# Patient Record
Sex: Male | Born: 1950
Health system: Southern US, Community
[De-identification: ages and names within clinical notes are randomized; demographics above are authoritative.]

## PROBLEM LIST (undated history)

## (undated) DIAGNOSIS — R112 Nausea with vomiting, unspecified: Secondary | ICD-10-CM

## (undated) DIAGNOSIS — Z9889 Other specified postprocedural states: Secondary | ICD-10-CM

## (undated) DIAGNOSIS — I10 Essential (primary) hypertension: Secondary | ICD-10-CM

## (undated) DIAGNOSIS — E785 Hyperlipidemia, unspecified: Secondary | ICD-10-CM

## (undated) HISTORY — DX: Hyperlipidemia, unspecified: E78.5

## (undated) HISTORY — PX: BACK SURGERY: SHX140

## (undated) HISTORY — PX: EXPLORATORY LAPAROTOMY: SUR591

---

## 2003-01-04 ENCOUNTER — Ambulatory Visit (HOSPITAL_COMMUNITY): Admission: RE | Admit: 2003-01-04 | Discharge: 2003-01-04 | Payer: Self-pay | Admitting: Gastroenterology

## 2003-04-03 ENCOUNTER — Ambulatory Visit (HOSPITAL_COMMUNITY): Admission: RE | Admit: 2003-04-03 | Discharge: 2003-04-03 | Payer: Self-pay | Admitting: Gastroenterology

## 2006-02-07 ENCOUNTER — Emergency Department (HOSPITAL_COMMUNITY): Admission: EM | Admit: 2006-02-07 | Discharge: 2006-02-07 | Payer: Self-pay | Admitting: Emergency Medicine

## 2006-06-08 ENCOUNTER — Ambulatory Visit: Payer: Self-pay | Admitting: Internal Medicine

## 2006-06-25 ENCOUNTER — Ambulatory Visit: Payer: Self-pay | Admitting: Internal Medicine

## 2008-05-20 ENCOUNTER — Emergency Department (HOSPITAL_COMMUNITY): Admission: EM | Admit: 2008-05-20 | Discharge: 2008-05-20 | Payer: Self-pay | Admitting: Family Medicine

## 2008-06-30 ENCOUNTER — Ambulatory Visit: Payer: Self-pay | Admitting: Internal Medicine

## 2008-07-01 ENCOUNTER — Encounter: Payer: Self-pay | Admitting: Family Medicine

## 2008-07-06 ENCOUNTER — Ambulatory Visit: Payer: Self-pay | Admitting: Internal Medicine

## 2008-07-07 ENCOUNTER — Ambulatory Visit: Payer: Self-pay | Admitting: *Deleted

## 2008-08-03 ENCOUNTER — Ambulatory Visit (HOSPITAL_COMMUNITY): Admission: RE | Admit: 2008-08-03 | Discharge: 2008-08-03 | Payer: Self-pay | Admitting: Urology

## 2008-09-20 ENCOUNTER — Ambulatory Visit: Payer: Self-pay | Admitting: Internal Medicine

## 2008-09-20 ENCOUNTER — Encounter: Payer: Self-pay | Admitting: Family Medicine

## 2008-09-20 LAB — CONVERTED CEMR LAB
BUN: 14 mg/dL (ref 6–23)
CO2: 22 meq/L (ref 19–32)
Cholesterol: 190 mg/dL (ref 0–200)
Creatinine, Ser: 0.77 mg/dL (ref 0.40–1.50)
Eosinophils Relative: 4 % (ref 0–5)
Glucose, Bld: 98 mg/dL (ref 70–99)
HCT: 44.4 % (ref 39.0–52.0)
Hemoglobin: 14.4 g/dL (ref 13.0–17.0)
Lymphocytes Relative: 48 % — ABNORMAL HIGH (ref 12–46)
Lymphs Abs: 3.2 10*3/uL (ref 0.7–4.0)
Monocytes Absolute: 0.6 10*3/uL (ref 0.1–1.0)
Monocytes Relative: 8 % (ref 3–12)
Total Bilirubin: 0.5 mg/dL (ref 0.3–1.2)
Total CHOL/HDL Ratio: 3.7
Total Protein: 7 g/dL (ref 6.0–8.3)
Triglycerides: 245 mg/dL — ABNORMAL HIGH (ref <150)
VLDL: 49 mg/dL — ABNORMAL HIGH (ref 0–40)
WBC: 6.7 10*3/uL (ref 4.0–10.5)

## 2008-09-27 ENCOUNTER — Ambulatory Visit: Payer: Self-pay | Admitting: Internal Medicine

## 2008-10-10 ENCOUNTER — Ambulatory Visit: Payer: Self-pay | Admitting: Family Medicine

## 2008-10-25 ENCOUNTER — Encounter (INDEPENDENT_AMBULATORY_CARE_PROVIDER_SITE_OTHER): Payer: Self-pay | Admitting: Adult Health

## 2008-10-25 ENCOUNTER — Ambulatory Visit: Payer: Self-pay | Admitting: Internal Medicine

## 2008-10-25 LAB — CONVERTED CEMR LAB
AST: 26 units/L (ref 0–37)
Alkaline Phosphatase: 72 units/L (ref 39–117)
BUN: 10 mg/dL (ref 6–23)
Basophils Relative: 0 % (ref 0–1)
Creatinine, Ser: 0.73 mg/dL (ref 0.40–1.50)
Eosinophils Absolute: 0.2 10*3/uL (ref 0.0–0.7)
MCHC: 33.6 g/dL (ref 30.0–36.0)
MCV: 91.4 fL (ref 78.0–100.0)
Monocytes Relative: 9 % (ref 3–12)
Neutrophils Relative %: 37 % — ABNORMAL LOW (ref 43–77)
Potassium: 4.4 meq/L (ref 3.5–5.3)
RBC: 4.79 M/uL (ref 4.22–5.81)
Total Bilirubin: 0.5 mg/dL (ref 0.3–1.2)

## 2008-11-08 ENCOUNTER — Ambulatory Visit: Payer: Self-pay | Admitting: Internal Medicine

## 2008-11-28 ENCOUNTER — Encounter: Payer: Self-pay | Admitting: Family Medicine

## 2008-11-28 ENCOUNTER — Ambulatory Visit: Payer: Self-pay | Admitting: Internal Medicine

## 2008-11-28 LAB — CONVERTED CEMR LAB
ALT: 23 units/L (ref 0–53)
CO2: 23 meq/L (ref 19–32)
Calcium: 9.3 mg/dL (ref 8.4–10.5)
Chloride: 106 meq/L (ref 96–112)
Cholesterol: 183 mg/dL (ref 0–200)
Potassium: 4.2 meq/L (ref 3.5–5.3)
Sed Rate: 3 mm/hr (ref 0–16)
Sodium: 142 meq/L (ref 135–145)
Testosterone: 125.62 ng/dL — ABNORMAL LOW (ref 350–890)
Total Protein: 7.2 g/dL (ref 6.0–8.3)
Vit D, 25-Hydroxy: 23 ng/mL — ABNORMAL LOW (ref 30–89)
Vitamin B-12: 618 pg/mL (ref 211–911)

## 2008-12-14 ENCOUNTER — Ambulatory Visit: Payer: Self-pay | Admitting: Internal Medicine

## 2009-01-10 ENCOUNTER — Ambulatory Visit: Payer: Self-pay | Admitting: Internal Medicine

## 2009-01-12 ENCOUNTER — Ambulatory Visit: Payer: Self-pay | Admitting: Internal Medicine

## 2009-01-22 ENCOUNTER — Ambulatory Visit: Payer: Self-pay | Admitting: *Deleted

## 2009-02-12 ENCOUNTER — Ambulatory Visit: Payer: Self-pay | Admitting: Internal Medicine

## 2009-03-08 ENCOUNTER — Ambulatory Visit: Payer: Self-pay | Admitting: Internal Medicine

## 2009-03-14 ENCOUNTER — Ambulatory Visit: Payer: Self-pay | Admitting: Internal Medicine

## 2009-04-13 ENCOUNTER — Ambulatory Visit: Payer: Self-pay | Admitting: Internal Medicine

## 2009-05-08 ENCOUNTER — Ambulatory Visit: Payer: Self-pay | Admitting: Family Medicine

## 2009-05-14 ENCOUNTER — Ambulatory Visit: Payer: Self-pay | Admitting: Internal Medicine

## 2009-05-15 ENCOUNTER — Encounter: Payer: Self-pay | Admitting: Family Medicine

## 2009-06-13 ENCOUNTER — Ambulatory Visit: Payer: Self-pay | Admitting: Internal Medicine

## 2009-07-13 ENCOUNTER — Ambulatory Visit: Payer: Self-pay | Admitting: Internal Medicine

## 2009-08-09 ENCOUNTER — Ambulatory Visit: Payer: Self-pay | Admitting: Internal Medicine

## 2009-09-18 ENCOUNTER — Ambulatory Visit: Payer: Self-pay | Admitting: Internal Medicine

## 2009-10-16 ENCOUNTER — Ambulatory Visit: Payer: Self-pay | Admitting: Internal Medicine

## 2009-11-14 ENCOUNTER — Ambulatory Visit: Payer: Self-pay | Admitting: Internal Medicine

## 2009-11-26 ENCOUNTER — Ambulatory Visit: Payer: Self-pay | Admitting: Internal Medicine

## 2010-02-01 ENCOUNTER — Ambulatory Visit: Payer: Self-pay | Admitting: Internal Medicine

## 2010-02-01 LAB — CONVERTED CEMR LAB
ALT: 25 units/L (ref 0–53)
AST: 21 units/L (ref 0–37)
Albumin: 4.5 g/dL (ref 3.5–5.2)
Alkaline Phosphatase: 67 units/L (ref 39–117)
Calcium: 9.4 mg/dL (ref 8.4–10.5)
Chloride: 104 meq/L (ref 96–112)
Creatinine, Ser: 0.69 mg/dL (ref 0.40–1.50)
LDL Cholesterol: 96 mg/dL (ref 0–99)
Potassium: 4.1 meq/L (ref 3.5–5.3)
Testosterone: 130.09 ng/dL — ABNORMAL LOW (ref 350–890)
Total CHOL/HDL Ratio: 3.7
Uric Acid, Serum: 4 mg/dL (ref 4.0–7.8)

## 2010-02-05 ENCOUNTER — Ambulatory Visit: Payer: Self-pay | Admitting: Internal Medicine

## 2010-02-18 ENCOUNTER — Ambulatory Visit: Payer: Self-pay | Admitting: Internal Medicine

## 2010-03-19 ENCOUNTER — Ambulatory Visit: Payer: Self-pay | Admitting: Internal Medicine

## 2010-04-04 ENCOUNTER — Ambulatory Visit: Payer: Self-pay | Admitting: Internal Medicine

## 2010-04-18 ENCOUNTER — Ambulatory Visit: Payer: Self-pay | Admitting: Internal Medicine

## 2010-05-02 ENCOUNTER — Ambulatory Visit: Payer: Self-pay | Admitting: Internal Medicine

## 2010-05-03 ENCOUNTER — Emergency Department (HOSPITAL_COMMUNITY): Admission: EM | Admit: 2010-05-03 | Discharge: 2010-05-03 | Payer: Self-pay | Admitting: Emergency Medicine

## 2010-06-13 ENCOUNTER — Encounter (INDEPENDENT_AMBULATORY_CARE_PROVIDER_SITE_OTHER): Payer: Self-pay | Admitting: *Deleted

## 2010-07-12 ENCOUNTER — Ambulatory Visit (HOSPITAL_COMMUNITY)
Admission: RE | Admit: 2010-07-12 | Discharge: 2010-07-12 | Payer: Self-pay | Source: Home / Self Care | Admitting: Family Medicine

## 2010-10-10 ENCOUNTER — Encounter (INDEPENDENT_AMBULATORY_CARE_PROVIDER_SITE_OTHER): Payer: Self-pay | Admitting: *Deleted

## 2010-10-10 LAB — CONVERTED CEMR LAB: Testosterone: 300.94 ng/dL (ref 250–890)

## 2010-10-25 ENCOUNTER — Encounter (INDEPENDENT_AMBULATORY_CARE_PROVIDER_SITE_OTHER): Payer: Self-pay | Admitting: Family Medicine

## 2010-10-25 LAB — CONVERTED CEMR LAB
ALT: 22 units/L (ref 0–53)
BUN: 12 mg/dL (ref 6–23)
CO2: 25 meq/L (ref 19–32)
Calcium: 9.7 mg/dL (ref 8.4–10.5)
Cholesterol: 185 mg/dL (ref 0–200)
Creatinine, Ser: 0.9 mg/dL (ref 0.40–1.50)
HDL: 45 mg/dL (ref >39)
Total Bilirubin: 0.8 mg/dL (ref 0.3–1.2)
Total CHOL/HDL Ratio: 4.1
Triglycerides: 158 mg/dL — ABNORMAL HIGH (ref <150)
VLDL: 32 mg/dL (ref 0–40)

## 2010-12-25 ENCOUNTER — Inpatient Hospital Stay (INDEPENDENT_AMBULATORY_CARE_PROVIDER_SITE_OTHER)
Admission: RE | Admit: 2010-12-25 | Discharge: 2010-12-25 | Disposition: A | Discharge status: Home / Self Care | Payer: Self-pay | Source: Ambulatory Visit | Attending: Emergency Medicine | Admitting: Emergency Medicine

## 2010-12-25 ENCOUNTER — Ambulatory Visit (INDEPENDENT_AMBULATORY_CARE_PROVIDER_SITE_OTHER): Payer: Self-pay

## 2010-12-25 DIAGNOSIS — S61409A Unspecified open wound of unspecified hand, initial encounter: Secondary | ICD-10-CM

## 2010-12-25 LAB — GLUCOSE, CAPILLARY: Glucose-Capillary: 104 mg/dL — ABNORMAL HIGH (ref 70–99)

## 2011-01-17 NOTE — Op Note (Signed)
NAMEREVIS, WHALIN                          ACCOUNT NO.:  [id number]   MEDICAL RECORD NO.:  [id number]                   PATIENT TYPE:  AMB   LOCATION:  ENDO                                 FACILITY:  [facility]   PHYSICIAN:  Danise Edge, M.D.                DATE OF BIRTH:  February 26, 1951   DATE OF PROCEDURE:  04/03/2003  DATE OF DISCHARGE:                                 OPERATIVE REPORT   PROCEDURE:  Esophagogastroduodenoscopy.   PROCEDURE INDICATION:  [name] is a 60 year old male, born [birth year].  [name] underwent an esophagogastroduodenoscopy Jan 04, 2003,  to evaluate epigastric pain.  His esophagogastroduodenoscopy revealed a 2 mm  x 5 mm prepyloric gastric antrum ulcer which was negative for Helicobacter  pylori by CLOtest.  [name] has completed approximately nine weeks of  proton pump inhibitor therapy.  He is scheduled to undergo a repeat  esophagogastroduodenoscopy to confirm ulcer healing.   ENDOSCOPIST:  Charolett Bumpers, M.D.   PREMEDICATION:  1. Versed 7.5 mg.  2. Demerol 50 mg.   DESCRIPTION OF PROCEDURE:  After obtaining informed consent, [name]  was placed in the left lateral decubitus position.  I administered  intravenous Versed and intravenous Demerol to achieve conscious sedation for  the procedure.  The patient's blood pressure, oxygen saturation, and cardiac  rhythm were monitored throughout the procedure and documented in the medical  record.   The Olympus gastroscope was passed through the posterior hypopharynx into  the proximal esophagus without difficulty.  The hypopharynx, larynx, and  vocal cords appeared normal.   ESOPHAGOSCOPY:  The proximal, mid, and lower segments of the esophageal  mucosa appeared normal.   GASTROSCOPY:  Retroflexed view of the gastric cardia and fundus was normal.  The gastric body appeared normal.  There is 95% healing of the prepyloric  gastric antral ulcer, originally diagnosed Jan 04, 2003.  What remains is a 2  mm shallow erosion with exudative base.  The pylorus appears normal.   DUODENOSCOPY:  The duodenal bulb and descending duodenum appear normal.   ASSESSMENT:  1. Healing of 95% of the prepyloric gastric antral ulcer, originally     diagnosed Jan 04, 2003.  2. CLOtest was negative for Helicobacter pylori antral gastritis.    PLAN:  I do not think [name] requires any further proton pump  inhibitor therapy.  I will ask him to remain off nonsteroidal anti-  inflammatory medication which undoubtedly was the cause of his gastric  antral ulcer.                                               Danise Edge, M.D.    MJ/MEDQ  D:  04/03/2003  T:  04/03/2003  Job:  [id number]  cc:   [facility]  [address]  [place], [place] [address]

## 2011-01-17 NOTE — Op Note (Signed)
NAMEJAYCEON, TROY NO.:  [id number]   MEDICAL RECORD NO.:  [id number]                   PATIENT TYPE:  AMB   LOCATION:  ENDO                                 FACILITY:  [facility]   PHYSICIAN:  Danise Edge, M.D.                DATE OF BIRTH:  May 02, 1951   DATE OF PROCEDURE:  01/04/2003  DATE OF DISCHARGE:                                 OPERATIVE REPORT   REFERRING PHYSICIAN:  Justice Britain, PA., [facility].   PROCEDURE:  Esophagogastroduodenoscopy.   PROCEDURE INDICATION:  Mr. Omar Gayden is a 60 year old male, born 10/31/1950.  Mr. Albertina Senegal was evaluated at the [facility] complaining of epigastric pain.  His H. pylori antibody titer  was elevated.  He has symptomatically improved on proton pump inhibitor  therapy (Prilosec).  Esophagogastroduodenoscopy is scheduled today.   ENDOSCOPIST:  Charolett Bumpers, M.D.   PREMEDICATION:  1. Versed 7.5 mg.  2. Demerol 50 mg.   DESCRIPTION OF PROCEDURE:  After obtaining informed consent, Mr. Albertina Senegal  was placed in the left lateral decubitus position.  I administered  intravenous Demerol and intravenous Versed to achieve conscious sedation for  the procedure.  The patient's blood pressure, oxygen saturation, and cardiac  rhythm were monitored throughout the procedure and documented in the medical  record.   The Olympus gastroscope was passed through the posterior hypopharynx into  the proximal esophagus without difficulty.  The hypopharynx, larynx, and  vocal cords appeared normal.   ESOPHAGOSCOPY:  The proximal, mid, and lower segments of the esophageal  mucosa appear normal.   GASTROSCOPY:  Retroflexed view of the gastric cardia and fundus was normal.  The gastric body appeared normal.  In the immediate prepyloric gastric  antrum there is a 2 mm x 5 mm ulcer with exudative base and no stigmata of  bleeding.  Ulcer margins are sharp and  consistent with a benign prepyloric  gastric ulcer.  The pylorus appears normal.   DUODENOSCOPY:  The duodenal bulb, mid duodenum, and distal duodenum appear  normal.   BIOPSY:  A biopsy was take from the distal gastric antrum for CLOtest to  rule out Helicobacter pylori antral gastritis.    ASSESSMENT:  1. Prepyloric gastric antral ulcer.  2. Rule out nonsteroidal anti-inflammatory medication versus H. pylori.   PLAN:  I will see Mr. Albertina Senegal back in my office in approximately one week.  He continues to improve on Prilosec.                                               Danise Edge, M.D.    MJ/MEDQ  D:  01/04/2003  T:  01/04/2003  Job:  [id number]   cc:   Justice Britain,  PA  [facility]  [address]  [place], [place] [address]

## 2011-02-06 LAB — HEMOCCULT SLIDES (X 3 CARDS)

## 2011-03-11 ENCOUNTER — Encounter (INDEPENDENT_AMBULATORY_CARE_PROVIDER_SITE_OTHER): Payer: Self-pay

## 2011-03-11 ENCOUNTER — Encounter (INDEPENDENT_AMBULATORY_CARE_PROVIDER_SITE_OTHER): Payer: Self-pay | Admitting: Vascular Surgery

## 2011-03-11 DIAGNOSIS — R0989 Other specified symptoms and signs involving the circulatory and respiratory systems: Secondary | ICD-10-CM

## 2011-03-11 DIAGNOSIS — R1013 Epigastric pain: Secondary | ICD-10-CM

## 2011-03-11 NOTE — Consult Note (Signed)
NEW PATIENT CONSULTATION  Michael Campos, [name] DOB:  1951-05-18                                       03/11/2011 EAVWU#:[id number]  Patient is a 60 year old Hispanic male referred for a pulsatile abdominal mass and abdominal pain.  This patient via an interpreter states that he has had intermittent upper abdominal discomfort over the past 5 years, usually 1-2 episodes per year, not associated with nausea, vomiting, or change in bowel habits.  He does have some occasional heartburn.  On physical exam at [facility], there was a question of a pulsatile mass.  He was referred to rule out an aneurysm.  CHRONIC MEDICAL PROBLEMS: 1. Type 2 diabetes mellitus. 2. Hyperlipidemia. 3. Hypertension.  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  Does not use tobacco, has not for 20 years.  Does not use alcohol.  FAMILY HISTORY:  Positive for diabetes in his mother.  Negative for coronary artery disease or stroke.  REVIEW OF SYSTEMS:  Completely negative in a complete review of systems.  PHYSICAL EXAMINATION:  Blood pressure 126/74, heart rate 60, respirations 16.  General:  He is a well-developed and well-nourished male in no apparent distress, alert and oriented x3.  HEENT:  Normal for age.  EOMs intact.  Lungs:  Clear to auscultation.  No rhonchi or wheezing.  Cardiovascular:  Regular rhythm.  No murmurs.  Carotid pulses are 3+.  No audible bruits.  Abdomen:  Soft, nontender.  No pulsatile masses appreciated.  Musculoskeletal:  Free of major deformities. Neurologic:  Normal.  Skin:  Free of rashes.  Lower extremity exam reveals 3+ femoral and popliteal pulses palpable bilaterally.  Today I ordered a duplex scan of the abdominal aorta which I have reviewed and interpreted.  There is no evidence of an aneurysm in the aorta with normal caliber.  I reassured the patient via the interpreter that he did not have an aneurysm or any  vascular reason for his intermittent pain, and no treatment is indicated from a vascular standpoint.  Return on a p.r.n. basis.    Michael Campos, M.D. Electronically Signed  JDL/MEDQ  D:  03/11/2011  T:  03/11/2011  Job:  [id number]  cc:   Maurice March, M.D.

## 2011-03-19 NOTE — Procedures (Unsigned)
DUPLEX ULTRASOUND OF ABDOMINAL AORTA  INDICATION:  Pulsatile abdominal mass and epigastric pain for several months.  HISTORY: Diabetes:  No. Cardiac:  No. Hypertension:  No. Smoking:  No. Connective Tissue Disorder: Family History:  No. Previous Surgery:  No.  DUPLEX EXAM:         AP (cm)                   TRANSVERSE (cm) Proximal             0.86 cm                   1.03 cm Mid                  1.59 cm                   1.54 cm Distal               1.49 cm                   1.49 cm Right Iliac          0.91 cm Left Iliac           0.94 cm  PREVIOUS:  None  IMPRESSION:  No evidence of abdominal aortic aneurysm.  ___________________________________________ Quita Skye Hart [place], M.D.  RS/MEDQ  D:  03/12/2011  T:  03/12/2011  Job:  [id number]

## 2011-05-31 ENCOUNTER — Inpatient Hospital Stay (INDEPENDENT_AMBULATORY_CARE_PROVIDER_SITE_OTHER)
Admission: RE | Admit: 2011-05-31 | Discharge: 2011-05-31 | Disposition: A | Discharge status: Home / Self Care | Payer: Self-pay | Source: Ambulatory Visit | Attending: Family Medicine | Admitting: Family Medicine

## 2011-05-31 DIAGNOSIS — H9209 Otalgia, unspecified ear: Secondary | ICD-10-CM

## 2011-05-31 DIAGNOSIS — H6980 Other specified disorders of Eustachian tube, unspecified ear: Secondary | ICD-10-CM

## 2011-06-02 LAB — POCT URINALYSIS DIP (DEVICE)
Bilirubin Urine: NEGATIVE
Ketones, ur: NEGATIVE
Protein, ur: NEGATIVE
Specific Gravity, Urine: 1.025
pH: 5.5

## 2011-07-30 ENCOUNTER — Other Ambulatory Visit (HOSPITAL_COMMUNITY): Payer: Self-pay | Admitting: Family Medicine

## 2011-07-30 DIAGNOSIS — E039 Hypothyroidism, unspecified: Secondary | ICD-10-CM

## 2011-08-12 ENCOUNTER — Ambulatory Visit (HOSPITAL_COMMUNITY)
Admission: RE | Admit: 2011-08-12 | Discharge: 2011-08-12 | Disposition: A | Discharge status: Home / Self Care | Payer: Self-pay | Source: Ambulatory Visit | Attending: Family Medicine | Admitting: Family Medicine

## 2011-08-12 DIAGNOSIS — Z13820 Encounter for screening for osteoporosis: Secondary | ICD-10-CM | POA: Insufficient documentation

## 2011-08-12 DIAGNOSIS — E039 Hypothyroidism, unspecified: Secondary | ICD-10-CM | POA: Insufficient documentation

## 2011-09-30 ENCOUNTER — Emergency Department (HOSPITAL_COMMUNITY)
Admission: EM | Admit: 2011-09-30 | Discharge: 2011-09-30 | Disposition: A | Discharge status: Home / Self Care | Payer: Self-pay | Source: Home / Self Care

## 2011-09-30 ENCOUNTER — Encounter (HOSPITAL_COMMUNITY): Payer: Self-pay | Admitting: Emergency Medicine

## 2011-09-30 DIAGNOSIS — S61012A Laceration without foreign body of left thumb without damage to nail, initial encounter: Secondary | ICD-10-CM

## 2011-09-30 HISTORY — DX: Essential (primary) hypertension: I10

## 2011-09-30 MED ORDER — CEPHALEXIN 500 MG PO CAPS: 500.0000 mg | ORAL_CAPSULE | Freq: Two times a day (BID) | ORAL | Status: AC

## 2011-09-30 MED ORDER — HYDROCODONE-ACETAMINOPHEN 5-325 MG PO TABS
ORAL_TABLET | ORAL | Status: AC
  Filled 2011-09-30: qty 1

## 2011-09-30 MED ORDER — HYDROCODONE-ACETAMINOPHEN 5-325 MG PO TABS
1.0000 | ORAL_TABLET | ORAL | Status: AC
  Administered 2011-09-30: 1 via ORAL

## 2011-09-30 MED ORDER — BACITRACIN 500 UNIT/GM EX OINT
1.0000 "application " | TOPICAL_OINTMENT | Freq: Two times a day (BID) | CUTANEOUS | Status: DC
  Administered 2011-09-30: 1 via TOPICAL

## 2011-09-30 NOTE — ED Provider Notes (Signed)
History     CSN: [id number]  Arrival date & time 09/30/11  1834   None     Chief Complaint  Patient presents with  . 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  Reports he slipped and cut his left thumb with the knife.  Denies change in sensation or mobility of his thumb.  Denies any other injury.  Tetanus vaccine 8 months ago.    Patient is a 61 y.o. male presenting with skin laceration. The history is provided by the patient.  Laceration     Past Medical History  Diagnosis Date  . Diabetes mellitus   . Hypertension     History reviewed. No pertinent past surgical history.  No family history on file.  History  Substance Use Topics  . Smoking status: Never Smoker   . Smokeless tobacco: Not on file  . Alcohol Use: No      Review of Systems  All other systems reviewed and are negative.    Allergies  Review of patient's allergies indicates no known allergies.  Home Medications   Current Outpatient Rx  Name Route Sig Dispense Refill  . ATORVASTATIN CALCIUM 10 MG PO TABS Oral Take 10 mg by mouth daily.    Marland Kitchen CALCIUM + D PO Oral Take by mouth.    Marland Kitchen VITAMIN D PO Oral Take by mouth.    Marland Kitchen GLIMEPIRIDE 2 MG PO TABS Oral Take 2 mg by mouth daily before breakfast.    . LISINOPRIL 5 MG PO TABS Oral Take 5 mg by mouth daily.    Marland Kitchen FISH OIL CONCENTRATE PO Oral Take by mouth.    . OMEPRAZOLE 20 MG PO CPDR Oral Take 20 mg by mouth daily.      BP 141/81  Pulse 75  Temp(Src) 97.5 F (36.4 C) (Oral)  Resp 18  SpO2 96%  Physical Exam  Nursing note and vitals reviewed. Constitutional: He is oriented to person, place, and time. He appears well-developed and well-nourished.  HENT:  Head: Normocephalic and atraumatic.  Neck: Neck supple.  Pulmonary/Chest: Effort normal.  Musculoskeletal:   Left hand: He exhibits laceration. He exhibits normal range of motion and normal capillary refill. normal sensation noted. Normal strength noted.       Hands: Neurological: He is alert and oriented to person, place, and time.    ED Course  Procedures (including critical care time)  Labs Reviewed - No data to display No results found.  LACERATION REPAIR Performed by: Rise Patience Consent: Verbal consent obtained. Risks and benefits: risks, benefits and alternatives were discussed Patient identity confirmed: provided demographic data Time out performed prior to procedure Prepped and Draped in normal sterile fashion Wound explored  Laceration Location: left thumb  Laceration Length: 2cm  No Foreign Bodies seen or palpated  Anesthesia: digital block  Local anesthetic: lidocaine 2% no epinephrine  Anesthetic total: 5 ml  Irrigation method: scrub, lavage Amount of cleaning: standard  Skin closure: 5-0 nylon  Number of sutures or staples: 6  Technique: simple interrupted  Patient tolerance: Patient tolerated the procedure well with no immediate complications.  1. Laceration of thumb, left       MDM  Patient with laceration to left thumb with his own knife while working.  Neurologically intact, full ROM.  Doubt any bony involvement.  Wound sutured in urgent care.  Patient given keflex  and wound care instructions as patient is diabetic.  Precautions given for immediate return.  Patient verbalizes understanding and agrees with plan.          Dillard Cannon [place], [place] 09/30/11 2105

## 2011-09-30 NOTE — ED Notes (Signed)
Reports cutting left thumb today with a pocketknife.  Currently has a dry dressing intact.

## 2011-09-30 NOTE — ED Notes (Signed)
Tetanus was last than a year-July 2012

## 2011-10-01 NOTE — ED Provider Notes (Signed)
Medical screening examination/treatment/procedure(s) were performed by non-physician practitioner and as supervising physician I was immediately available for consultation/collaboration.   [facility]; MD   Sharin Grave, MD 10/01/11 [phone]

## 2011-12-16 ENCOUNTER — Encounter (INDEPENDENT_AMBULATORY_CARE_PROVIDER_SITE_OTHER): Payer: Self-pay | Admitting: General Surgery

## 2011-12-16 ENCOUNTER — Ambulatory Visit (INDEPENDENT_AMBULATORY_CARE_PROVIDER_SITE_OTHER): Payer: PRIVATE HEALTH INSURANCE | Admitting: General Surgery

## 2011-12-16 VITALS — BP 128/80 | HR 88 | Temp 97.8°F | Resp 16 | Ht 67.5 in | Wt 179.4 lb

## 2011-12-16 DIAGNOSIS — K4090 Unilateral inguinal hernia, without obstruction or gangrene, not specified as recurrent: Secondary | ICD-10-CM | POA: Insufficient documentation

## 2011-12-16 NOTE — Patient Instructions (Signed)
Call us if you want to schedule the surgery.  [phone].
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
## 2012-09-21 ENCOUNTER — Emergency Department (INDEPENDENT_AMBULATORY_CARE_PROVIDER_SITE_OTHER): Payer: Self-pay

## 2012-09-21 ENCOUNTER — Encounter (HOSPITAL_COMMUNITY): Payer: Self-pay | Admitting: *Deleted

## 2012-09-21 ENCOUNTER — Emergency Department (INDEPENDENT_AMBULATORY_CARE_PROVIDER_SITE_OTHER)
Admission: EM | Admit: 2012-09-21 | Discharge: 2012-09-21 | Disposition: A | Discharge status: Home / Self Care | Payer: Self-pay | Source: Home / Self Care | Attending: Emergency Medicine | Admitting: Emergency Medicine

## 2012-09-21 DIAGNOSIS — S93602A Unspecified sprain of left foot, initial encounter: Secondary | ICD-10-CM

## 2012-09-21 DIAGNOSIS — M19079 Primary osteoarthritis, unspecified ankle and foot: Secondary | ICD-10-CM

## 2012-09-21 DIAGNOSIS — S93609A Unspecified sprain of unspecified foot, initial encounter: Secondary | ICD-10-CM

## 2012-09-21 DIAGNOSIS — M19072 Primary osteoarthritis, left ankle and foot: Secondary | ICD-10-CM

## 2012-09-21 MED ORDER — MELOXICAM 7.5 MG PO TABS: 7.5000 mg | ORAL_TABLET | Freq: Every day | ORAL | Status: DC

## 2012-09-21 MED ORDER — GLIMEPIRIDE 2 MG PO TABS: 2.0000 mg | ORAL_TABLET | Freq: Every day | ORAL | Status: DC

## 2012-09-21 MED ORDER — OMEPRAZOLE 20 MG PO CPDR: 20.0000 mg | DELAYED_RELEASE_CAPSULE | Freq: Every day | ORAL | Status: DC

## 2012-09-21 MED ORDER — TRAMADOL HCL 50 MG PO TABS: 50.0000 mg | ORAL_TABLET | Freq: Four times a day (QID) | ORAL | Status: DC | PRN

## 2012-09-21 MED ORDER — LISINOPRIL 5 MG PO TABS: 5.0000 mg | ORAL_TABLET | Freq: Every day | ORAL | Status: DC
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
## 2012-09-24 ENCOUNTER — Encounter (HOSPITAL_COMMUNITY): Payer: Self-pay

## 2012-09-24 ENCOUNTER — Emergency Department (HOSPITAL_COMMUNITY)
Admission: EM | Admit: 2012-09-24 | Discharge: 2012-09-24 | Disposition: A | Discharge status: Home / Self Care | Payer: Self-pay | Source: Home / Self Care | Attending: Family Medicine | Admitting: Family Medicine

## 2012-09-24 DIAGNOSIS — E119 Type 2 diabetes mellitus without complications: Secondary | ICD-10-CM

## 2012-09-24 DIAGNOSIS — I10 Essential (primary) hypertension: Secondary | ICD-10-CM

## 2012-09-24 DIAGNOSIS — E785 Hyperlipidemia, unspecified: Secondary | ICD-10-CM

## 2012-09-24 LAB — COMPREHENSIVE METABOLIC PANEL
AST: 21 U/L (ref 0–37)
BUN: 12 mg/dL (ref 6–23)
CO2: 26 mEq/L (ref 19–32)
Chloride: 103 mEq/L (ref 96–112)
Creatinine, Ser: 0.57 mg/dL (ref 0.50–1.35)
GFR calc non Af Amer: >90 mL/min (ref >90)
Total Bilirubin: 0.2 mg/dL — ABNORMAL LOW (ref 0.3–1.2)

## 2012-09-24 LAB — LIPID PANEL
LDL Cholesterol: UNDETERMINED mg/dL (ref 0–99)
Triglycerides: 427 mg/dL — ABNORMAL HIGH (ref <150)

## 2012-09-24 LAB — HEMOGLOBIN A1C: Hgb A1c MFr Bld: 6.4 % — ABNORMAL HIGH (ref <5.7)

## 2012-09-24 LAB — TSH: TSH: 1.865 u[IU]/mL (ref 0.350–4.500)

## 2012-09-24 MED ORDER — LISINOPRIL 5 MG PO TABS: 5.0000 mg | ORAL_TABLET | Freq: Every day | ORAL | Status: DC

## 2012-09-24 MED ORDER — OMEPRAZOLE 20 MG PO CPDR: 20.0000 mg | DELAYED_RELEASE_CAPSULE | Freq: Every day | ORAL | Status: DC

## 2012-09-24 MED ORDER — GLIMEPIRIDE 2 MG PO TABS: 2.0000 mg | ORAL_TABLET | Freq: Every day | ORAL | Status: DC

## 2012-09-24 MED ORDER — ATORVASTATIN CALCIUM 10 MG PO TABS: 10.0000 mg | ORAL_TABLET | Freq: Every day | ORAL | Status: DC

## 2012-09-24 NOTE — ED Notes (Signed)
Follow up-sprain to left foot
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
## 2012-09-25 LAB — VITAMIN D 25 HYDROXY (VIT D DEFICIENCY, FRACTURES): Vit D, 25-Hydroxy: 39 ng/mL (ref 30–89)

## 2012-09-25 NOTE — Progress Notes (Signed)
Quick Note:  Please notify patient that his labs came back ok except that his triglycerides were elevated. I recommend that patient return for fasting lipid panel in 1 month (nurse visit for labs).    Michael Langton, MD, CDE, FAAFP [facility] [place], [place]   ______

## 2012-10-04 ENCOUNTER — Telehealth (HOSPITAL_COMMUNITY): Payer: Self-pay

## 2012-10-04 NOTE — Telephone Encounter (Signed)
Message copied by Lestine Mount on Mon Oct 04, 2012  9:55 AM ------      Message from: Cleora Fleet      Created: Sat Sep 25, 2012  6:00 PM       Please notify patient that his labs came back ok except that his triglycerides were elevated.  I recommend that patient return for fasting lipid panel in 1 month (nurse visit for labs).                    Rodney Langton, MD, CDE, FAAFP      [facility]      [facility]      [place], [place]

## 2012-10-26 ENCOUNTER — Emergency Department (HOSPITAL_COMMUNITY)
Admission: EM | Admit: 2012-10-26 | Discharge: 2012-10-26 | Disposition: A | Discharge status: Home / Self Care | Payer: No Typology Code available for payment source | Source: Home / Self Care

## 2012-10-26 ENCOUNTER — Encounter (HOSPITAL_COMMUNITY): Payer: Self-pay | Admitting: *Deleted

## 2012-10-26 DIAGNOSIS — I10 Essential (primary) hypertension: Secondary | ICD-10-CM

## 2012-10-26 DIAGNOSIS — R200 Anesthesia of skin: Secondary | ICD-10-CM

## 2012-10-26 DIAGNOSIS — R209 Unspecified disturbances of skin sensation: Secondary | ICD-10-CM

## 2012-10-26 DIAGNOSIS — E785 Hyperlipidemia, unspecified: Secondary | ICD-10-CM

## 2012-10-26 DIAGNOSIS — E119 Type 2 diabetes mellitus without complications: Secondary | ICD-10-CM

## 2012-10-26 LAB — LIPID PANEL: HDL: 49 mg/dL (ref >39)
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
## 2012-10-26 NOTE — ED Notes (Signed)
Pt needs fasting labs and reports "sleep" like feeling in left hand with tingling and numbness - no chest pain or radiating pain

## 2013-01-04 ENCOUNTER — Emergency Department (HOSPITAL_COMMUNITY)
Admission: EM | Admit: 2013-01-04 | Discharge: 2013-01-04 | Disposition: A | Discharge status: Home / Self Care | Payer: No Typology Code available for payment source | Source: Home / Self Care

## 2013-01-04 ENCOUNTER — Encounter (HOSPITAL_COMMUNITY): Payer: Self-pay | Admitting: *Deleted

## 2013-01-04 DIAGNOSIS — I10 Essential (primary) hypertension: Secondary | ICD-10-CM

## 2013-01-04 DIAGNOSIS — M722 Plantar fascial fibromatosis: Secondary | ICD-10-CM

## 2013-01-04 DIAGNOSIS — E785 Hyperlipidemia, unspecified: Secondary | ICD-10-CM

## 2013-01-04 DIAGNOSIS — E119 Type 2 diabetes mellitus without complications: Secondary | ICD-10-CM

## 2013-01-04 LAB — LIPID PANEL
LDL Cholesterol: 113 mg/dL — ABNORMAL HIGH (ref 0–99)
Total CHOL/HDL Ratio: 4.1 RATIO
VLDL: 40 mg/dL (ref 0–40)

## 2013-01-04 MED ORDER — LISINOPRIL 5 MG PO TABS: 5.0000 mg | ORAL_TABLET | Freq: Every day | ORAL | Status: DC

## 2013-01-04 MED ORDER — OMEPRAZOLE 20 MG PO CPDR: 20.0000 mg | DELAYED_RELEASE_CAPSULE | Freq: Every day | ORAL | Status: DC

## 2013-01-04 MED ORDER — ATORVASTATIN CALCIUM 10 MG PO TABS: 10.0000 mg | ORAL_TABLET | Freq: Every day | ORAL | Status: DC

## 2013-01-04 MED ORDER — GLIMEPIRIDE 2 MG PO TABS: 2.0000 mg | ORAL_TABLET | Freq: Every day | ORAL | Status: DC

## 2013-01-04 NOTE — ED Notes (Signed)
Present for follow-up labs and refill medications.
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
## 2013-03-22 ENCOUNTER — Ambulatory Visit: Payer: No Typology Code available for payment source

## 2013-04-05 ENCOUNTER — Encounter: Payer: Self-pay | Admitting: Internal Medicine

## 2013-04-05 ENCOUNTER — Ambulatory Visit: Payer: No Typology Code available for payment source | Attending: Family Medicine | Admitting: Internal Medicine

## 2013-04-05 VITALS — BP 121/78 | HR 56 | Temp 97.9°F | Resp 16 | Ht 67.0 in | Wt 180.4 lb

## 2013-04-05 DIAGNOSIS — M79609 Pain in unspecified limb: Secondary | ICD-10-CM | POA: Insufficient documentation

## 2013-04-05 DIAGNOSIS — Z0000 Encounter for general adult medical examination without abnormal findings: Secondary | ICD-10-CM | POA: Insufficient documentation

## 2013-04-05 DIAGNOSIS — G5690 Unspecified mononeuropathy of unspecified upper limb: Secondary | ICD-10-CM

## 2013-04-05 DIAGNOSIS — M792 Neuralgia and neuritis, unspecified: Secondary | ICD-10-CM

## 2013-04-05 DIAGNOSIS — E119 Type 2 diabetes mellitus without complications: Secondary | ICD-10-CM

## 2013-04-05 LAB — COMPREHENSIVE METABOLIC PANEL
ALT: 28 U/L (ref 0–53)
AST: 23 U/L (ref 0–37)
Albumin: 4.4 g/dL (ref 3.5–5.2)
BUN: 15 mg/dL (ref 6–23)
CO2: 26 mEq/L (ref 19–32)
Calcium: 9.6 mg/dL (ref 8.4–10.5)
Chloride: 104 mEq/L (ref 96–112)
Potassium: 4.6 mEq/L (ref 3.5–5.3)

## 2013-04-05 MED ORDER — NAPROXEN 500 MG PO TABS: 500.0000 mg | ORAL_TABLET | Freq: Two times a day (BID) | ORAL | Status: DC

## 2013-04-05 MED ORDER — GABAPENTIN 300 MG PO CAPS: 300.0000 mg | ORAL_CAPSULE | Freq: Three times a day (TID) | ORAL | Status: DC

## 2013-04-05 MED ORDER — HYDROCORTISONE 0.5 % EX CREA: TOPICAL_CREAM | Freq: Two times a day (BID) | CUTANEOUS | Status: DC

## 2013-04-05 MED ORDER — TRAMADOL HCL 50 MG PO TABS: 50.0000 mg | ORAL_TABLET | Freq: Four times a day (QID) | ORAL | Status: DC | PRN

## 2013-04-05 NOTE — Progress Notes (Signed)
PT COMES IN WITH C/O ONGOING PAIN IN BOTH HANDS RADIATING TO FOREARMS X 2 MNTHS. STATES HE FEELS WEAKNESS WITH HAND GRASP.INTERMITT SHARP,SHOOTING PAIN.TAKING OTC IBUPROFEN FOR PAIN. A1C ORDERED.

## 2013-04-05 NOTE — Progress Notes (Signed)
Patient ID: Michael Campos, male   DOB: 05-Feb-1951, 62 y.o.   MRN: [id number]  CC: Pain both hands for 2 years  HPI: Patient returns today for follow-up and for physical. His main concern is pain in both hands which has been going on for about 2 year, occurs mostly at night, like burning pain, mostly at the tips of the fingers and the palm. He does not remember any kind of trauma although he works with heavy machinery on daily basis. He is diabetic, hypertensive and dyslipidemic, he is compliant with medications and follow-up. He had last colonoscopy about 1-1/2 years ago, the polyp was removed otherwise normal. His previous laboratory tests result were satisfactory. Last hemoglobin A1c was 6.0. His previous eye exam was normal. His previous leg and ankle pains better, although he has not seen a podiatrist in years. He denies any headache or chest pain, no shortness of breath, no leg swelling, no joint swelling. He was seen in the clinic today with his wife  No Known Allergies Past Medical History  Diagnosis Date  . Diabetes mellitus   . Hypertension   . Hyperlipidemia    Current Outpatient Prescriptions on File Prior to Visit  Medication Sig Dispense Refill  . atorvastatin (LIPITOR) 10 MG tablet Take 1 tablet (10 mg total) by mouth daily.  30 tablet  5  . Calcium Carbonate-Vitamin D (CALCIUM + D PO) Take by mouth.      . Cholecalciferol (VITAMIN D PO) Take by mouth.      Marland Kitchen glimepiride (AMARYL) 2 MG tablet Take 1 tablet (2 mg total) by mouth daily before breakfast.  30 tablet  5  . lisinopril (PRINIVIL,ZESTRIL) 5 MG tablet Take 1 tablet (5 mg total) by mouth daily.  30 tablet  5  . Omega-3 Fatty Acids (FISH OIL CONCENTRATE PO) Take by mouth.      Marland Kitchen omeprazole (PRILOSEC) 20 MG capsule Take 1 capsule (20 mg total) by mouth daily.  30 capsule  5  . meloxicam (MOBIC) 7.5 MG tablet Take 1 tablet (7.5 mg total) by mouth daily. Take one tablet daily for 2 weeks  14 tablet  0   No current  facility-administered medications on file prior to visit.   History reviewed. No pertinent family history. History   Social History  . Marital Status: Married    Spouse Name: N/A    Number of Children: N/A  . Years of Education: N/A   Occupational History  . Not on file.   Social History Main Topics  . Smoking status: Never Smoker   . Smokeless tobacco: Not on file  . Alcohol Use: No  . Drug Use: No  . Sexually Active: Not on file   Other Topics Concern  . Not on file   Social History Narrative  . No narrative on file    Review of Systems: Constitutional: Negative for fever, chills, diaphoresis, activity change, appetite change and fatigue. HENT: Negative for ear pain, nosebleeds, congestion, facial swelling, rhinorrhea, neck pain, neck stiffness and ear discharge.  Eyes: Negative for pain, discharge, redness, itching and visual disturbance. Respiratory: Negative for cough, choking, chest tightness, shortness of breath, wheezing and stridor.  Cardiovascular: Negative for chest pain, palpitations and leg swelling. Gastrointestinal: Negative for abdominal distention. Genitourinary: Negative for dysuria, urgency, frequency, hematuria, flank pain, decreased urine volume, difficulty urinating and dyspareunia.  Musculoskeletal: Negative for back pain, joint swelling, arthralgias and gait problem. Neurological: Negative for dizziness, tremors, seizures, syncope, facial asymmetry, speech difficulty, weakness, light-headedness,  numbness and headaches.  Hematological: Negative for adenopathy. Does not bruise/bleed easily. Psychiatric/Behavioral: Negative for hallucinations, behavioral problems, confusion, dysphoric mood, decreased concentration and agitation.    Objective:   Filed Vitals:   04/05/13 0920  BP: 121/78  Pulse: 56  Temp: 97.9 F (36.6 C)  Resp: 16    Physical Exam: Constitutional: Patient appears well-developed and well-nourished. No distress. HENT:  Normocephalic, atraumatic, External right and left ear normal. Oropharynx is clear and moist.  Eyes: Conjunctivae and EOM are normal. PERRLA, no scleral icterus. Neck: Normal ROM. Neck supple. No JVD. No tracheal deviation. No thyromegaly. CVS: RRR, S1/S2 +, no murmurs, no gallops, no carotid bruit.  Pulmonary: Effort and breath sounds normal, no stridor, rhonchi, wheezes, rales.  Abdominal: Soft. BS +,  no distension, tenderness, rebound or guarding.  Musculoskeletal: Normal range of motion. No edema and no tenderness. Slightly puffy palm, no area of redness. Range of motion is normal globally. No loss of sensation Lymphadenopathy: No lymphadenopathy noted, cervical, inguinal or axillary Neuro: Alert. Normal reflexes, muscle tone coordination. No cranial nerve deficit. Skin: Skin is warm and dry. No rash noted. Not diaphoretic. No erythema. No pallor. Psychiatric: Normal mood and affect. Behavior, judgment, thought content normal.  Lab Results  Component Value Date   WBC 6.9 10/25/2008   HGB 14.7 10/25/2008   HCT 43.8 10/25/2008   MCV 91.4 10/25/2008   PLT 355 10/25/2008   Lab Results  Component Value Date   CREATININE 0.57 09/24/2012   BUN 12 09/24/2012   NA 139 09/24/2012   K 4.0 09/24/2012   CL 103 09/24/2012   CO2 26 09/24/2012    Lab Results  Component Value Date   HGBA1C 6.0* 01/04/2013   Lipid Panel     Component Value Date/Time   CHOL 203* 01/04/2013 1030   TRIG 201* 01/04/2013 1030   HDL 50 01/04/2013 1030   CHOLHDL 4.1 01/04/2013 1030   VLDL 40 01/04/2013 1030   LDLCALC 113* 01/04/2013 1030       Assessment and plan:   Patient Active Problem List   Diagnosis Date Noted  . Diabetes 04/05/2013  . Neuropathic pain of hand 04/05/2013  . Physical exam, annual 04/05/2013  . Inguinal hernia without mention of obstruction or gangrene, unilateral or unspecified, (not specified as recurrent) 12/16/2011   Hemoglobin A1c today is 5.9  Plan: Gabapentin 300 mg by mouth 3 times a  day Naprosyn 500 mg by mouth twice a day Patient was given a referral to podiatrist for foot exam Labs today include comprehensive metabolic panel and urinalysis Patient was conseled about pain       Mynor Witkop was given clear instructions to go to ER or return to the clinic if symptoms don't improve, worsen or new problems develop.  Jeri Modena verbalized understanding.  Rashaad Hallstrom was told to call to get lab results if hasn't heard anything in the next week.    Interpreter was used to communicate directly with patient for the entire encounter including providing detailed patient instructions.   Return to clinic for followup in 3 months  Jeanann Lewandowsky, MD [facility] [place], [place] [phone]   04/05/2013, 9:49 AM

## 2013-04-28 ENCOUNTER — Telehealth: Payer: Self-pay | Admitting: Family Medicine

## 2013-04-28 NOTE — Telephone Encounter (Signed)
Pt has not received lab results from day came in for physical. Please f/u with pt.

## 2013-04-29 ENCOUNTER — Other Ambulatory Visit: Payer: Self-pay | Admitting: Internal Medicine

## 2013-04-29 NOTE — Telephone Encounter (Signed)
Pt requesting lab results from 04/05/13

## 2013-05-04 NOTE — Telephone Encounter (Signed)
Please call patient to inform her that her lab results came back normal except that she is at risk of developing diabetes based on her HbA1C of 5.9%. She needs diet and exercise control. Low carbohydrate diet, no soda, wheat bread, lots of vegetable. Exercise 3 times a week, 30 min at a time. We will repeat the test again in 3 months.

## 2013-05-09 ENCOUNTER — Telehealth: Payer: Self-pay | Admitting: Emergency Medicine

## 2013-05-16 ENCOUNTER — Other Ambulatory Visit: Payer: Self-pay | Admitting: Emergency Medicine

## 2013-05-16 ENCOUNTER — Ambulatory Visit (HOSPITAL_COMMUNITY)
Admission: RE | Admit: 2013-05-16 | Discharge: 2013-05-16 | Disposition: A | Discharge status: Home / Self Care | Payer: No Typology Code available for payment source | Source: Ambulatory Visit | Attending: Internal Medicine | Admitting: Internal Medicine

## 2013-05-16 ENCOUNTER — Ambulatory Visit: Payer: No Typology Code available for payment source | Attending: Family Medicine | Admitting: Internal Medicine

## 2013-05-16 ENCOUNTER — Encounter: Payer: Self-pay | Admitting: Internal Medicine

## 2013-05-16 ENCOUNTER — Telehealth: Payer: Self-pay | Admitting: Emergency Medicine

## 2013-05-16 VITALS — BP 156/81 | HR 60 | Temp 98.2°F | Resp 16

## 2013-05-16 DIAGNOSIS — M792 Neuralgia and neuritis, unspecified: Secondary | ICD-10-CM

## 2013-05-16 DIAGNOSIS — Z79899 Other long term (current) drug therapy: Secondary | ICD-10-CM | POA: Insufficient documentation

## 2013-05-16 DIAGNOSIS — M25512 Pain in left shoulder: Secondary | ICD-10-CM

## 2013-05-16 DIAGNOSIS — M79609 Pain in unspecified limb: Secondary | ICD-10-CM | POA: Insufficient documentation

## 2013-05-16 DIAGNOSIS — M25519 Pain in unspecified shoulder: Secondary | ICD-10-CM | POA: Insufficient documentation

## 2013-05-16 DIAGNOSIS — M19019 Primary osteoarthritis, unspecified shoulder: Secondary | ICD-10-CM | POA: Insufficient documentation

## 2013-05-16 DIAGNOSIS — E785 Hyperlipidemia, unspecified: Secondary | ICD-10-CM | POA: Insufficient documentation

## 2013-05-16 DIAGNOSIS — I10 Essential (primary) hypertension: Secondary | ICD-10-CM | POA: Insufficient documentation

## 2013-05-16 DIAGNOSIS — G5690 Unspecified mononeuropathy of unspecified upper limb: Secondary | ICD-10-CM

## 2013-05-16 DIAGNOSIS — M25529 Pain in unspecified elbow: Secondary | ICD-10-CM | POA: Insufficient documentation

## 2013-05-16 DIAGNOSIS — M25539 Pain in unspecified wrist: Secondary | ICD-10-CM | POA: Insufficient documentation

## 2013-05-16 DIAGNOSIS — E119 Type 2 diabetes mellitus without complications: Secondary | ICD-10-CM | POA: Insufficient documentation

## 2013-05-16 MED ORDER — OMEPRAZOLE 20 MG PO CPDR: 20.0000 mg | DELAYED_RELEASE_CAPSULE | Freq: Every day | ORAL | Status: DC

## 2013-05-16 MED ORDER — GLIMEPIRIDE 2 MG PO TABS: 2.0000 mg | ORAL_TABLET | Freq: Every day | ORAL | Status: DC

## 2013-05-16 MED ORDER — LISINOPRIL 5 MG PO TABS: 5.0000 mg | ORAL_TABLET | Freq: Every day | ORAL | Status: DC

## 2013-05-16 MED ORDER — ATORVASTATIN CALCIUM 10 MG PO TABS: 10.0000 mg | ORAL_TABLET | Freq: Every day | ORAL | Status: DC

## 2013-05-16 MED ORDER — NAPROXEN 500 MG PO TABS: 500.0000 mg | ORAL_TABLET | Freq: Two times a day (BID) | ORAL | Status: DC

## 2013-05-16 MED ORDER — IBUPROFEN 800 MG PO TABS: 800.0000 mg | ORAL_TABLET | Freq: Three times a day (TID) | ORAL | Status: DC | PRN

## 2013-05-16 MED ORDER — GABAPENTIN 300 MG PO CAPS: 300.0000 mg | ORAL_CAPSULE | Freq: Three times a day (TID) | ORAL | Status: DC

## 2013-05-16 MED ORDER — DIPHENHYDRAMINE-ZINC ACETATE 2-0.1 % EX CREA: TOPICAL_CREAM | Freq: Three times a day (TID) | CUTANEOUS | Status: DC | PRN

## 2013-05-16 NOTE — Patient Instructions (Addendum)
Codo de tenista  (Tennis Elbow)  El profesional que lo asiste le ha diagnosticado una enfermedad denominada "codo de tenista". Esto es el resultado de pequeños desgarros o un resentimiento (inflamación) al comienzo (origen) del músculo extensor del antebrazo. Aunque la enfermedad a menudo se denomina codo de tenista o de golfista, está ocasionada por una acción repetida realizada con el codo.  INSTRUCCIONES PARA EL CUIDADO DOMICILIARIO  · Si la enfermedad recién aparece, el descanso será el único tratamiento requerido. Utilizar el otro brazo para realizar la tarea podrá ser de utilidad. Incluso cambiar el agarre puede ayudar a descansar la extremidad. Esto también podrá prevenir que la enfermedad sea recurrente.  · Los problemas de largo plazo, sin embargo, podrán aliviarse más rápido mediante:  · La utilización de agentes antiinflamatorios.  · La aplicación de bolsas con hielo por 30 minutos luego del día laboral, a la hora de dormir, o cuando se han finalizado las actividades.  · El médico también podrá recomendarle una tablilla o cabestrillo. Esto permitirá que el tendón inflamado se cure.  A veces, se requerirán inyecciones de esteroides junto con anestesia local y un entablillado por 1 a 2 semanas. Dos o tres inyecciones de esteroides a menudo resuelven el problema. En algunos casos de largo plazo, el tendón inflamado no responde a la terapia conservadora (no quirúrgica). Otras veces puede ser necesaria la cirugía.  ESTÉ SEGURO QUE:   · Comprende las instrucciones para el alta médica.  · Controlará su enfermedad.  · Solicitará atención médica de inmediato según las indicaciones.  Document Released: 08/18/2005 Document Revised: 11/10/2011  [employer]® Patient Information ©2014 [employer].

## 2013-05-16 NOTE — Progress Notes (Signed)
Pt here f/u left hand pain-decrease hand grasp Neuropathy- not taking gabapentin due to side effect\ Medication refill Lisinopril,Glimeperide,Lipitor and Omeprazole ANA negative

## 2013-05-16 NOTE — Progress Notes (Signed)
Patient ID: Michael Campos, male   DOB: 1951/04/14, 62 y.o.   MRN: [id number]   CC: Left arm, elbow, shoulder pain  HPI: Patient is 62 year old male who presents to clinic with main concern of progressively worsening left elbow pain that is intermittent in nature, throbbing, 5/10 in severity when present and radiating to left shoulder and left wrist. He says this has been present for several weeks but getting worse over the past few days. 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 He denies numbness and tingling, no specific nodules or lumps noted in wrist, elbow, shoulder but he does explain range of motion is severely reduced compared to several months ago. He denies fevers and chills, no other systemic concerns. He would also like refill on his medications.  No Known Allergies Past Medical History  Diagnosis Date  . Diabetes mellitus   . Hypertension   . Hyperlipidemia    Current Outpatient Prescriptions on File Prior to Visit  Medication Sig Dispense Refill  . Calcium Carbonate-Vitamin D (CALCIUM + D PO) Take by mouth.      . Cholecalciferol (VITAMIN D PO) Take by mouth.      . Omega-3 Fatty Acids (FISH OIL CONCENTRATE PO) Take by mouth.      Marland Kitchen omeprazole (PRILOSEC) 20 MG capsule Take 1 capsule (20 mg total) by mouth daily.  30 capsule  5  . hydrocortisone cream 0.5 % Apply topically 2 (two) times daily.  30 g  0  . meloxicam (MOBIC) 7.5 MG tablet Take 1 tablet (7.5 mg total) by mouth daily. Take one tablet daily for 2 weeks  14 tablet  0   No current facility-administered medications on file prior to visit.   No known family medical history History   Social History  . Marital Status: Married    Spouse Name: N/A    Number of Children: N/A  . Years of Education: N/A   Occupational History  . Not on file.   Social History Main Topics  . Smoking status: Never Smoker   . Smokeless tobacco: Not on file  .  Alcohol Use: No  . Drug Use: No  . Sexual Activity: Not on file   Other Topics Concern  . Not on file   Social History Narrative  . No narrative on file    Review of Systems  Constitutional: Negative for fever, chills, diaphoresis, activity change, appetite change and fatigue.  HENT: Negative for ear pain, nosebleeds, congestion, facial swelling, rhinorrhea, neck pain, neck stiffness and ear discharge.   Eyes: Negative for pain, discharge, redness, itching and visual disturbance.  Respiratory: Negative for cough, choking, chest tightness, shortness of breath, wheezing and stridor.   Cardiovascular: Negative for chest pain, palpitations and leg swelling.  Gastrointestinal: Negative for abdominal distention.  Genitourinary: Negative for dysuria, urgency, frequency, hematuria, flank pain, decreased urine volume, difficulty urinating and dyspareunia.  Musculoskeletal: Negative for back pain, and gait problem.  Neurological: Negative for dizziness, tremors, seizures, syncope, facial asymmetry, speech difficulty, weakness, light-headedness, numbness and headaches.  Hematological: Negative for adenopathy. Does not bruise/bleed easily.  Psychiatric/Behavioral: Negative for hallucinations, behavioral problems, confusion, dysphoric mood, decreased concentration and agitation.    Objective:   Filed Vitals:   05/16/13 1020  BP: 156/81  Pulse: 60  Temp: 98.2 F (36.8 C)  Resp: 16    Physical Exam  Constitutional: Appears well-developed and well-nourished. No distress.  HENT: Normocephalic. External right  and left ear normal. Oropharynx is clear and moist.  Eyes: Conjunctivae and EOM are normal. PERRLA, no scleral icterus.  Neck: Normal ROM. Neck supple. No JVD. No tracheal deviation. No thyromegaly.  CVS: RRR, S1/S2 +, no murmurs, no gallops, no carotid bruit.  Pulmonary: Effort and breath sounds normal, no stridor, rhonchi, wheezes, rales.  Abdominal: Soft. BS +,  no distension,  tenderness, rebound or guarding.  Musculoskeletal: Normal range of motion in all joints except left elbow, patient having difficulty with extension and flexion, difficulty with rotation of the left elbow, significant tenderness in medial and lateral aspect of the elbow  Lymphadenopathy: No lymphadenopathy noted, cervical, inguinal.   Lab Results  Component Value Date   WBC 6.9 10/25/2008   HGB 14.7 10/25/2008   HCT 43.8 10/25/2008   MCV 91.4 10/25/2008   PLT 355 10/25/2008   Lab Results  Component Value Date   CREATININE 0.68 04/05/2013   BUN 15 04/05/2013   NA 139 04/05/2013   K 4.6 04/05/2013   CL 104 04/05/2013   CO2 26 04/05/2013    Lab Results  Component Value Date   HGBA1C 5.9 04/05/2013   Lipid Panel     Component Value Date/Time   CHOL 203* 01/04/2013 1030   TRIG 201* 01/04/2013 1030   HDL 50 01/04/2013 1030   CHOLHDL 4.1 01/04/2013 1030   VLDL 40 01/04/2013 1030   LDLCALC 113* 01/04/2013 1030       Assessment and plan:   Patient Active Problem List   Diagnosis Date Noted  . Diabetes - well controlled, continue same medical regimen, check A1c in 3 months  04/05/2013   Left elbow, shoulder, wrist pain - unclear etiology but most likely secondary to joint overuse giving the nature of patient's job. I have advised rest for one to [redacted] weeks along with anti-inflammatory medicine ibuprofen. We have discussed side effects of ibuprofen. I have also advised wrist planned an elbow splint to stabilize the joint. We'll also obtain x-ray of the left shoulder, elbow, wrist for further evaluation. Patient advised to come back to clinic if his symptoms do not get better or get worse.

## 2013-05-16 NOTE — Telephone Encounter (Signed)
RX OMEPRAZOLE REORDERED AND SENT TO [facility]

## 2013-05-17 ENCOUNTER — Ambulatory Visit: Payer: No Typology Code available for payment source | Admitting: Family Medicine

## 2013-05-26 ENCOUNTER — Other Ambulatory Visit: Payer: Self-pay | Admitting: Emergency Medicine

## 2013-05-26 DIAGNOSIS — M792 Neuralgia and neuritis, unspecified: Secondary | ICD-10-CM

## 2013-05-26 MED ORDER — OMEPRAZOLE 40 MG PO CPDR: 40.0000 mg | DELAYED_RELEASE_CAPSULE | Freq: Every day | ORAL | Status: DC

## 2013-05-26 MED ORDER — GABAPENTIN 300 MG PO CAPS: 300.0000 mg | ORAL_CAPSULE | Freq: Three times a day (TID) | ORAL | Status: DC

## 2013-06-24 ENCOUNTER — Other Ambulatory Visit: Payer: No Typology Code available for payment source

## 2013-07-06 ENCOUNTER — Ambulatory Visit: Payer: No Typology Code available for payment source | Admitting: Family Medicine

## 2013-07-07 ENCOUNTER — Other Ambulatory Visit: Payer: Self-pay

## 2013-07-07 ENCOUNTER — Ambulatory Visit: Payer: No Typology Code available for payment source | Admitting: Family Medicine

## 2013-07-19 ENCOUNTER — Ambulatory Visit (HOSPITAL_COMMUNITY)
Admission: RE | Admit: 2013-07-19 | Discharge: 2013-07-19 | Disposition: A | Discharge status: Home / Self Care | Payer: No Typology Code available for payment source | Source: Ambulatory Visit | Attending: Internal Medicine | Admitting: Internal Medicine

## 2013-07-19 ENCOUNTER — Ambulatory Visit: Payer: No Typology Code available for payment source | Attending: Internal Medicine | Admitting: Internal Medicine

## 2013-07-19 VITALS — BP 131/77 | HR 99 | Temp 98.3°F | Resp 18 | Wt 183.2 lb

## 2013-07-19 DIAGNOSIS — E785 Hyperlipidemia, unspecified: Secondary | ICD-10-CM | POA: Insufficient documentation

## 2013-07-19 DIAGNOSIS — Z23 Encounter for immunization: Secondary | ICD-10-CM

## 2013-07-19 DIAGNOSIS — E119 Type 2 diabetes mellitus without complications: Secondary | ICD-10-CM | POA: Insufficient documentation

## 2013-07-19 DIAGNOSIS — M79609 Pain in unspecified limb: Secondary | ICD-10-CM | POA: Insufficient documentation

## 2013-07-19 DIAGNOSIS — I10 Essential (primary) hypertension: Secondary | ICD-10-CM | POA: Insufficient documentation

## 2013-07-19 DIAGNOSIS — M7989 Other specified soft tissue disorders: Secondary | ICD-10-CM | POA: Insufficient documentation

## 2013-07-19 DIAGNOSIS — M1990 Unspecified osteoarthritis, unspecified site: Secondary | ICD-10-CM

## 2013-07-19 DIAGNOSIS — M129 Arthropathy, unspecified: Secondary | ICD-10-CM

## 2013-07-19 MED ORDER — OMEPRAZOLE 40 MG PO CPDR: 40.0000 mg | DELAYED_RELEASE_CAPSULE | Freq: Every day | ORAL | Status: DC

## 2013-07-19 MED ORDER — MELOXICAM 15 MG PO TABS: 15.0000 mg | ORAL_TABLET | Freq: Every day | ORAL | Status: DC

## 2013-07-19 MED ORDER — LISINOPRIL 5 MG PO TABS
5.0000 mg | ORAL_TABLET | Freq: Every day | ORAL | Status: DC
Start: 2013-07-19 — End: 2013-11-14

## 2013-07-19 MED ORDER — ATORVASTATIN CALCIUM 10 MG PO TABS: 10.0000 mg | ORAL_TABLET | Freq: Every day | ORAL | Status: DC

## 2013-07-19 NOTE — Progress Notes (Signed)
Quick Note:  Please let patient know, that X ray of hands did not show any major abnormalities. ______

## 2013-07-19 NOTE — Progress Notes (Signed)
Patient here for bilateral hand pain Worse in the am hard to bend fingers

## 2013-07-19 NOTE — Progress Notes (Signed)
Patient Demographics  Michael Campos, is a 62 y.o. male  RUE:[id number]  JYN:[id number]  DOB - 10-01-1950  Chief Complaint  Patient presents with  . Hand Pain        Subjective:   Michael Campos today is here for a follow up visit. Patient has had chronic bilateral hand pain in his small joints of his hands and worsening morning stiffness for the past few months. He also complains of pain in his bilateral elbows and shoulder area.   patient has a history of diabetes, and regularly checks his sugars at home, wife and patient claims that last week his sugars have gone down to 60s. He stopped taking his medications for the past one week and claims that his sugars are mostly in the 80's and 90s range without him being on any medications.   Patient has No headache, No chest pain, No abdominal pain - No Nausea, No new weakness tingling or numbness, No Cough - SOB.  Objective:    Filed Vitals:   07/19/13 0931  BP: 131/77  Pulse: 99  Temp: 98.3 F (36.8 C)  Resp: 18  Weight: 183 lb 3.2 oz (83.099 kg)  SpO2: 100%     ALLERGIES:  No Known Allergies  PAST MEDICAL HISTORY: Past Medical History  Diagnosis Date  . Diabetes mellitus   . Hypertension   . Hyperlipidemia     MEDICATIONS AT HOME: Prior to Admission medications   Medication Sig Start Date End Date Taking? Authorizing Provider  atorvastatin (LIPITOR) 10 MG tablet Take 1 tablet (10 mg total) by mouth daily. 07/19/13   Shanker Levora Dredge, MD  Calcium Carbonate-Vitamin D (CALCIUM + D PO) Take by mouth.    Historical Provider, MD  Cholecalciferol (VITAMIN D PO) Take by mouth.    Historical Provider, MD  diphenhydrAMINE-zinc acetate (BENADRYL EXTRA STRENGTH) cream Apply topically 3 (three) times daily as needed for itching. 05/16/13   Dorothea Ogle, MD  gabapentin (NEURONTIN) 300 MG capsule Take 1 capsule (300 mg total) by mouth 3 (three) times daily. 05/26/13   Dorothea Ogle, MD  glimepiride (AMARYL) 2  MG tablet Take 1 tablet (2 mg total) by mouth daily before breakfast. 05/16/13   Dorothea Ogle, MD  hydrocortisone cream 0.5 % Apply topically 2 (two) times daily. 04/05/13   Jeanann Lewandowsky, MD  ibuprofen (ADVIL,MOTRIN) 800 MG tablet Take 1 tablet (800 mg total) by mouth every 8 (eight) hours as needed for pain. 05/16/13   Dorothea Ogle, MD  lisinopril (PRINIVIL,ZESTRIL) 5 MG tablet Take 1 tablet (5 mg total) by mouth daily. 07/19/13   Shanker Levora Dredge, MD  meloxicam (MOBIC) 15 MG tablet Take 1 tablet (15 mg total) by mouth daily. 07/19/13   Shanker Levora Dredge, MD  naproxen (NAPROSYN) 500 MG tablet Take 1 tablet (500 mg total) by mouth 2 (two) times daily with a meal. 05/16/13   Dorothea Ogle, MD  Omega-3 Fatty Acids (FISH OIL CONCENTRATE PO) Take by mouth.    Historical Provider, MD  omeprazole (PRILOSEC) 40 MG capsule Take 1 capsule (40 mg total) by mouth daily. 07/19/13   Shanker Levora Dredge, MD     Exam  General appearance :Awake, alert, not in any distress. Speech Clear. Not toxic Looking HEENT: Atraumatic and Normocephalic, pupils equally reactive to light and accomodation Neck: supple, no JVD. No cervical lymphadenopathy.  Chest:Good air entry bilaterally, no added sounds  CVS: S1 S2 regular, no murmurs.  Abdomen: Bowel sounds present, Non tender  and not distended with no gaurding, rigidity or rebound. Extremities: B/L Lower Ext shows no edema, both legs are warm to touch Neurology: Awake alert, and oriented X 3, CN II-XII intact, Non focal Skin:No Rash Wounds:N/A    Data Review   CBC No results found for this basename: WBC, HGB, HCT, PLT, MCV, MCH, MCHC, RDW, NEUTRABS, LYMPHSABS, MONOABS, EOSABS, BASOSABS, BANDABS, BANDSABD,  in the last 168 hours  Chemistries   No results found for this basename: NA, K, CL, CO2, GLUCOSE, BUN, CREATININE, GFRCGP, CALCIUM, MG, AST, ALT, ALKPHOS, BILITOT,  in the last 168  hours ------------------------------------------------------------------------------------------------------------------ No results found for this basename: HGBA1C,  in the last 72 hours ------------------------------------------------------------------------------------------------------------------ No results found for this basename: CHOL, HDL, LDLCALC, TRIG, CHOLHDL, LDLDIRECT,  in the last 72 hours ------------------------------------------------------------------------------------------------------------------ No results found for this basename: TSH, T4TOTAL, FREET3, T3FREE, THYROIDAB,  in the last 72 hours ------------------------------------------------------------------------------------------------------------------ No results found for this basename: VITAMINB12, FOLATE, FERRITIN, TIBC, IRON, RETICCTPCT,  in the last 72 hours  Coagulation profile  No results found for this basename: INR, PROTIME,  in the last 168 hours    Assessment & Plan  Hypertension - Continue lisinopril  Dyslipidemia - Continue Lipitor  Diabetes - A1c in August was 5.9, hypoglycemic at home-we'll monitor him off any medications. Repeat A1c next month, if A1c is creeping up, he likely will need to be restarted on perhaps metformin which does not cause hypoglycemia.  ? Rheumatoid arthritis - Complains of bilateral hand pain and morning stiffness. - Will change to Mobic 15 mg daily, check ESR RA factor and anti-CCP - Referred to rheumatology   Health Maintenance** -Colonoscopy: 2013-claims he needs repeat in 2018 -Vaccinations:  -Influenza: Today  Follow up in one month   The patient was given clear instructions to go to ER or return to medical center if symptoms don't improve, worsen or new problems develop. The patient verbalized understanding. The patient was told to call to get lab results if they haven't heard anything in the next week.

## 2013-07-25 ENCOUNTER — Other Ambulatory Visit: Payer: Self-pay | Admitting: Emergency Medicine

## 2013-07-25 MED ORDER — ROSUVASTATIN CALCIUM 10 MG PO TABS
10.0000 mg | ORAL_TABLET | Freq: Every day | ORAL | Status: DC
Start: 2013-07-25 — End: 2013-11-14

## 2013-08-02 ENCOUNTER — Ambulatory Visit (HOSPITAL_COMMUNITY): Payer: No Typology Code available for payment source

## 2013-08-15 ENCOUNTER — Ambulatory Visit: Payer: No Typology Code available for payment source

## 2013-08-22 ENCOUNTER — Other Ambulatory Visit: Payer: Self-pay | Admitting: Emergency Medicine

## 2013-08-22 DIAGNOSIS — M792 Neuralgia and neuritis, unspecified: Secondary | ICD-10-CM

## 2013-08-22 MED ORDER — GABAPENTIN 300 MG PO CAPS: 300.0000 mg | ORAL_CAPSULE | Freq: Three times a day (TID) | ORAL | Status: DC

## 2013-08-23 ENCOUNTER — Other Ambulatory Visit: Payer: Self-pay | Admitting: Emergency Medicine

## 2013-08-23 DIAGNOSIS — M792 Neuralgia and neuritis, unspecified: Secondary | ICD-10-CM

## 2013-11-14 ENCOUNTER — Ambulatory Visit: Payer: No Typology Code available for payment source | Attending: Internal Medicine | Admitting: Internal Medicine

## 2013-11-14 ENCOUNTER — Encounter: Payer: Self-pay | Admitting: Internal Medicine

## 2013-11-14 VITALS — BP 127/71 | HR 61 | Temp 98.0°F | Resp 16 | Ht 67.0 in | Wt 180.0 lb

## 2013-11-14 DIAGNOSIS — M79609 Pain in unspecified limb: Secondary | ICD-10-CM | POA: Insufficient documentation

## 2013-11-14 DIAGNOSIS — E119 Type 2 diabetes mellitus without complications: Secondary | ICD-10-CM | POA: Insufficient documentation

## 2013-11-14 DIAGNOSIS — G5690 Unspecified mononeuropathy of unspecified upper limb: Secondary | ICD-10-CM

## 2013-11-14 DIAGNOSIS — G609 Hereditary and idiopathic neuropathy, unspecified: Secondary | ICD-10-CM | POA: Insufficient documentation

## 2013-11-14 DIAGNOSIS — Z79899 Other long term (current) drug therapy: Secondary | ICD-10-CM | POA: Insufficient documentation

## 2013-11-14 DIAGNOSIS — I10 Essential (primary) hypertension: Secondary | ICD-10-CM | POA: Insufficient documentation

## 2013-11-14 DIAGNOSIS — M792 Neuralgia and neuritis, unspecified: Secondary | ICD-10-CM

## 2013-11-14 DIAGNOSIS — K219 Gastro-esophageal reflux disease without esophagitis: Secondary | ICD-10-CM | POA: Insufficient documentation

## 2013-11-14 DIAGNOSIS — E785 Hyperlipidemia, unspecified: Secondary | ICD-10-CM | POA: Insufficient documentation

## 2013-11-14 LAB — POCT GLYCOSYLATED HEMOGLOBIN (HGB A1C): HEMOGLOBIN A1C: 6.3

## 2013-11-14 LAB — GLUCOSE, POCT (MANUAL RESULT ENTRY): POC Glucose: 103 mg/dl — AB (ref 70–99)

## 2013-11-14 MED ORDER — ROSUVASTATIN CALCIUM 10 MG PO TABS: 10.0000 mg | ORAL_TABLET | Freq: Every day | ORAL | Status: DC

## 2013-11-14 MED ORDER — LISINOPRIL 5 MG PO TABS: 5.0000 mg | ORAL_TABLET | Freq: Every day | ORAL | Status: DC

## 2013-11-14 MED ORDER — TRAMADOL HCL 50 MG PO TABS: 50.0000 mg | ORAL_TABLET | Freq: Three times a day (TID) | ORAL | Status: DC | PRN

## 2013-11-14 MED ORDER — OMEPRAZOLE 40 MG PO CPDR: 40.0000 mg | DELAYED_RELEASE_CAPSULE | Freq: Every day | ORAL | Status: DC

## 2013-11-14 MED ORDER — METFORMIN HCL 500 MG PO TABS: 500.0000 mg | ORAL_TABLET | Freq: Two times a day (BID) | ORAL | Status: DC

## 2013-11-14 MED ORDER — GABAPENTIN 300 MG PO CAPS: 300.0000 mg | ORAL_CAPSULE | Freq: Three times a day (TID) | ORAL | Status: DC

## 2013-11-14 MED ORDER — DICLOFENAC SODIUM 1 % TD GEL: 2.0000 g | Freq: Four times a day (QID) | TRANSDERMAL | Status: DC

## 2013-11-14 NOTE — Patient Instructions (Signed)
Gua de planeamiento de la alimentacin para diabticos (Diabetes Meal Planning Guide) La gua de planeamiento de alimentacin para diabticos es una herramienta para ayudarlo a planear sus comidas y colaciones. Es importante para las personas con diabetes controlar sus niveles de Location manager. Elegir los [employer] correctos y las cantidades adecuadas durante el da le ayudar a Media planner. Comer bien puede incluso ayudarlo a mejorar la presin sangunea y Science writer o Theatre manager un peso saludable. CUENTE LOS HIDRATOS DE CARBONO CON FACILIDAD Cuando consume hidratos de carbono, stos se transforman en azcar (glucosa). Esto a su vez Agricultural consultant de Museum/gallery exhibitions officer. El conteo de carbohidratos puede ayudarlo a Chief Technology Officer este nivel para que se sienta mejor. Al planear sus alimentos con el conteo de carbohidratos, podr tener ms flexibilidad en lo que come y Curator con el consumo de alimentos. El conteo de carbohidratos significa simplemente sumar la cantidad total de gramos de carbohidratos a sus comidas o colaciones. Trate de consumir la misma cantidad en cada comida. A continuacin encontrar una lista de 1 porcin o 15 gr. de carbohidratos. A continuacin se enumeran. Pregunte al mdico cuntos gramos de carbohidratos necesita comer en cada comida o colacin. Almidones y granos  1 [place] de pan.   bollo ingls o bollo para hamburguesa o hotdog.   taza de cereal fro (sin azcar).   taza de pasta o arroz cocido.   taza de vegetales que contengan almidn (maz, papas, arvejas, porotos, calabaza).  1 omelette (6 pulgadas).   bollo.  1 waffle o panqueque (del tamao de un CD).   taza de cereales cocidos.  4 a 6 galletas saldas pequeas. *Se recomienda el consumo de granos enteros. Frutas  1 taza de frutos rojos, meln, papaya o anan sin azcar.  1 fruta fresca pequea.   banana o mango.   taza de jugo de frutas (4 onzas sin endulzar).    taza de fruta envasada en jugo natural o agua.  2 cucharadas de frutas secas.  12-15 uvas o cerezas. Leche y yogurt  1 taza de [employer] o al 1%.  [place].  6 onzas de yogurt descremado con edulcorante sin azcar.  6 onzas de yogur descremado de soja.  6 onzas de yogur natural. Vegetales  1 taza de vegetales crudos o  de vegetales cocidos se considera cero carbohidratos o una comida "libre".  Si come 3 o ms porciones en una comida, cuntelas como 1 porcin de carbohidratos. Otros carbohidratos   onzas de chips o pretzels.   taza de helado de crema o yogur helado.   taza de helado de agua.  5 cm de torta no congelada.  1 cucharada de miel, azcar, mermelada, jalea o almbar.  2 galletitas dulces pequeas.  3 cuadrados de crackers de graham.  3 tazas de palomitas de maz.  6 crackers.  1 taza de caldo.  Cuente 1 taza de guisado u otra mezcla de alimentos como 2 porciones de carbohidratos.  Los alimentos con menos de 20 caloras por porcin deben contarse como cero carbohidratos o alimento "libre". Si lo desea compre un libro o software de computacin que enumere la cantidad de gramos de carbohidratos de los diferentes alimentos. Adems, el panel nutricional en las etiquetas de los productos que consume es una buena fuente de informacin. Le indicar el tamao de la porcin y la cantidad total de carbohidratos que consumir por cada una. Divida este nmero por 15 para obtener el nmero  de conteo de carbohidratos por porcin. Recuerde: cada porcin son 15 gramos de carbohidratos. PORCIONES La medicin de los alimentos y el tamao de las porciones lo ayudarn a controlar la cantidad exacta de comida que debe ingerir. La lista que sigue le mostrar el tamao de algunas porciones comunes.   1 onza.................4 dados apilados.  3 onzas...............Un mazo de cartas.  1 cucharadita.....La punta de un dedo pequeo.  1  cucharada........Un dedo.  2 cucharadas......Una pelota de golf.   taza...............La mitad de un puo.  1 taza................Un puo. EJEMPLO DE PLAN DE ALIMENTACIN PARA DIABTICOS: A continuacin se muestra un ejemplo de plan de alimentacin que incluye comidas de los grupos de granos y fculas, vegetales, frutas y carnes. Un nutricionista podr confeccionarle un plan individualizado para cubrir sus necesidades calricas y decirle el nmero de porciones que necesita de cada grupo. Sin embargo, podra intercambiar los alimentos que contengan carbohidratos (lcteos, cereales y frutas). Controlar la cantidad total de carbohidratos en los alimentos o colaciones es ms importante que asegurarse de incluir todos los grupos alimenticios cada vez que come.  El siguiente plan de alimentacin es un ejemplo de una dieta de 2000 caloras mediante el conteo de carbohidratos. Este plan contiene 17 porciones de carbohidratos. Desayuno  1 taza de avena (2 porciones de carbohidratos).   taza de yogur light(1 porcin de carbohidratos).  1 taza de arndanos (1 porcin de carbohidratos).   taza de almendras. Colacin  1 manzana grande (2 porciones de carbohidratos).  1 palito de queso bajo en grasa. Almuerzo  Ensalada de pechuga de pollo.  1 taza de espinacas.   taza de tomates cortados.  2 oz (60 gr) de pechuga de pollo en rebanadas.  2 cucharadas de aderezo italiano bajo en contenido graso.  12 galletas integrales (2 porciones de carbohidratos).  12 a 15 uvas (1 porcin de carbohidratos).  1 taza de leche descremada (1porcin de carbohidratos). Colacin  1 taza de zanahorias.   taza de pur de garbanzos (1 porcin de carbohidratos). Cena  3 oz (80 gr) de salmn a la parrilla.  1 taza de arroz integral (3 porciones de carbohidratos). Colacin  1  taza de brcoli al vapor (1 porcin de carbohidrato) con una cucharadita de aceite de oliva y jugo de limn.  1 taza de  budn light (2 porciones de carbohidratos). HOJA DE PLANEAMIENTO DE LA ALIMENTACIN: El dietista podr utilizar esta hoja para ayudarlo a decidir cuntas porciones y qu tipos de alimentos son los adecuados para usted.  DESAYUNO Grupo de alimentos y porciones / Alimento elegido Granos/Fculas_________________________________________________ Lcteos________________________________________________________ Vegetales ______________________________________________________ Fruta __________________________________________________________ Carnes _________________________________________________________ Grasas _________________________________________________________ ALMUERZO Grupo de alimentos y porciones / Alimento elegido Granos/Fculas___________________________________________________ Lcteos_________________________________________________________ Fruta ___________________________________________________________ Carnes __________________________________________________________ Grasas __________________________________________________________ CENA Grupo de alimentos y porciones / Alimento elegido Granos/Fculas___________________________________________________ Lcteos_________________________________________________________ Fruta ___________________________________________________________ Carnes __________________________________________________________ Grasas __________________________________________________________ COLACIN Grupo de alimentos y porciones / Alimento elegido Granos/Fculas_________________________________________________ Lcteos________________________________________________________ Vegetales ______________________________________________________ Fruta _________________________________________________________ Carnes ________________________________________________________ Grasas ________________________________________________________ TOTALES  DIARIOS Fculas_______________________________________________________ Vegetales _____________________________________________________ Fruta ________________________________________________________ Lcteos_______________________________________________________ Carnes________________________________________________________ Grasas ________________________________________________________ Document Released: 11/25/2007 Document Revised: 11/10/2011 [employer] Patient Information 2014 [employer].  

## 2013-11-14 NOTE — Progress Notes (Signed)
Patient ID: Michael Campos, male   DOB: April 22, 1951, 63 y.o.   MRN: [id number]   Michael Campos, is a 63 y.o. male  [id number]  [id number]  DOB - Mar 10, 1951  Chief Complaint  Patient presents with  . Follow-up        Subjective:   Michael Campos is a 63 y.o. male here today for a follow up visit for his hand pain and DM. The patients hand pain is worse in the morning and is described as a tightness that improves with activity and worsened with rest. He rates the pain as a 9 in the morning and a 4-5 by midday. His Dm is currently poorly controlled. On his last office visit his Hgb A1C was 5.9 and has increased to 6.3 today. The patient was given Amaryl at his last visit which he states he did not take because he felt bad when he took it. He also states he eats 4-6 cookies every morning. His past medical history is positive for HTN, GERD, Dyslipidemia, and DM. His HTN, GERD, and dyslipidemia are controlled with present therapy. He does not abuse tobacco, alcohol, or drugs. Patient has No headache, No chest pain, No abdominal pain - No Nausea, No new weakness tingling or numbness, No Cough - SOB.  Problem  Htn (Hypertension)  Dyslipidemia  Gerd (Gastroesophageal Reflux Disease)    ALLERGIES: No Known Allergies  PAST MEDICAL HISTORY: Past Medical History  Diagnosis Date  . Diabetes mellitus   . Hypertension   . Hyperlipidemia     MEDICATIONS AT HOME: Prior to Admission medications   Medication Sig Start Date End Date Taking? Authorizing Provider  Calcium Carbonate-Vitamin D (CALCIUM + D PO) Take by mouth.   Yes Historical Provider, MD  lisinopril (PRINIVIL,ZESTRIL) 5 MG tablet Take 1 tablet (5 mg total) by mouth daily. 11/14/13  Yes Angelica Chessman, MD  Omega-3 Fatty Acids (FISH OIL CONCENTRATE PO) Take by mouth.   Yes Historical Provider, MD  omeprazole (PRILOSEC) 40 MG capsule Take 1 capsule (40 mg total) by mouth daily. 11/14/13  Yes Angelica Chessman, MD  rosuvastatin (CRESTOR) 10 MG tablet Take 1 tablet (10 mg total) by mouth daily. 11/14/13  Yes Angelica Chessman, MD  Cholecalciferol (VITAMIN D PO) Take by mouth.    Historical Provider, MD  diclofenac sodium (VOLTAREN) 1 % GEL Apply 2 g topically 4 (four) times daily. 11/14/13   Angelica Chessman, MD  diphenhydrAMINE-zinc acetate (BENADRYL EXTRA STRENGTH) cream Apply topically 3 (three) times daily as needed for itching. 05/16/13   Theodis Blaze, MD  gabapentin (NEURONTIN) 300 MG capsule Take 1 capsule (300 mg total) by mouth 3 (three) times daily. 11/14/13   Angelica Chessman, MD  glimepiride (AMARYL) 2 MG tablet Take 1 tablet (2 mg total) by mouth daily before breakfast. 05/16/13   Theodis Blaze, MD  hydrocortisone cream 0.5 % Apply topically 2 (two) times daily. 04/05/13   Angelica Chessman, MD  ibuprofen (ADVIL,MOTRIN) 800 MG tablet Take 1 tablet (800 mg total) by mouth every 8 (eight) hours as needed for pain. 05/16/13   Theodis Blaze, MD  meloxicam (MOBIC) 15 MG tablet Take 1 tablet (15 mg total) by mouth daily. 07/19/13   Shanker Kristeen Mans, MD  metFORMIN (GLUCOPHAGE) 500 MG tablet Take 1 tablet (500 mg total) by mouth 2 (two) times daily with a meal. 11/14/13   Angelica Chessman, MD  naproxen (NAPROSYN) 500 MG tablet Take 1 tablet (500 mg total) by mouth 2 (two) times daily with  a meal. 05/16/13   Theodis Blaze, MD  traMADol (ULTRAM) 50 MG tablet Take 1 tablet (50 mg total) by mouth every 8 (eight) hours as needed. 11/14/13   Angelica Chessman, MD     Objective:   Filed Vitals:   11/14/13 1146  BP: 127/71  Pulse: 61  Temp: 98 F (36.7 C)  TempSrc: Oral  Resp: 16  Height: 5\' 7"  (1.702 m)  Weight: 180 lb (81.647 kg)  SpO2: 96%    Exam General appearance : Awake, alert, not in any distress. Speech Clear. Not toxic looking HEENT: Atraumatic and Normocephalic, pupils equally reactive to light and accomodation Neck: supple, no JVD. No cervical lymphadenopathy.  Chest:Good air  entry bilaterally, no added sounds  CVS: S1 S2 regular, no murmurs.  Abdomen: Bowel sounds present, Non tender and not distended with no gaurding, rigidity or rebound. Extremities: Trigger left middle finger, B/L Lower Ext shows no edema, both legs are warm to touch, Bil. hands edematous and stiff Neurology: Awake alert, and oriented X 3, CN II-XII intact, Non focal Skin:No Rash Wounds:N/A  Data Review Lab Results  Component Value Date   HGBA1C 6.3 11/14/2013   HGBA1C 5.9 04/05/2013   HGBA1C 6.0* 01/04/2013     Assessment & Plan   1. Diabetes Patient counseled on the importance of diabetes control such as taking diabetic medications  as prescribed, following a diabetic diet, and exercise.  - HgB A1c has gone up to 6.3% from 5.9% previously - Glucose (CBG)  Restart: - metFORMIN (GLUCOPHAGE) 500 MG tablet; Take 1 tablet (500 mg total) by mouth 2 (two) times daily with a meal.  Dispense: 180 tablet; Refill: 3  2. HTN (hypertension)  - lisinopril (PRINIVIL,ZESTRIL) 5 MG tablet; Take 1 tablet (5 mg total) by mouth daily.  Dispense: 90 tablet; Refill: 3  3. Dyslipidemia  - rosuvastatin (CRESTOR) 10 MG tablet; Take 1 tablet (10 mg total) by mouth daily.  Dispense: 90 tablet; Refill: 3  4. Neuropathic pain of hand  - gabapentin (NEURONTIN) 300 MG capsule; Take 1 capsule (300 mg total) by mouth 3 (three) times daily.  Dispense: 180 capsule; Refill: 3 - traMADol (ULTRAM) 50 MG tablet; Take 1 tablet (50 mg total) by mouth every 8 (eight) hours as needed.  Dispense: 60 tablet; Refill: 0 - diclofenac sodium (VOLTAREN) 1 % GEL; Apply 2 g topically 4 (four) times daily.  Dispense: 1 Tube; Refill: 1  5. GERD (gastroesophageal reflux disease)  - omeprazole (PRILOSEC) 40 MG capsule; Take 1 capsule (40 mg total) by mouth daily.  Dispense: 90 capsule; Refill: 3  Patient was extensively counseled on nutrition and exercise.  Return in about 3 months (around 02/14/2014), or if symptoms worsen or  fail to improve, for Routine Follow Up, HTN/DM.  The patient was given clear instructions to go to ER or return to medical center if symptoms don't improve, worsen or new problems develop. The patient verbalized understanding. The patient was told to call to get lab results if they haven't heard anything in the next week.   This note has been created with Surveyor, quantity. Any transcriptional errors are unintentional.    Angelica Chessman, MD, [place], [place], [place] and [place], [place]   11/14/2013, 4:09 PM

## 2013-11-14 NOTE — Progress Notes (Signed)
Pt is here following up on his diabetes and HTN. Pt reports having pain in his joints. He has limited ROM in his hands. Pt has acute pain in his lower back. Pt has an interpretor.

## 2013-12-21 LAB — HM COLONOSCOPY

## 2014-02-07 ENCOUNTER — Ambulatory Visit: Payer: No Typology Code available for payment source | Attending: Internal Medicine

## 2014-02-14 ENCOUNTER — Encounter: Payer: Self-pay | Admitting: Internal Medicine

## 2014-02-14 ENCOUNTER — Ambulatory Visit: Payer: No Typology Code available for payment source | Attending: Internal Medicine | Admitting: Internal Medicine

## 2014-02-14 VITALS — BP 126/79 | HR 71 | Temp 97.6°F | Resp 16 | Ht 66.0 in | Wt 178.0 lb

## 2014-02-14 DIAGNOSIS — E785 Hyperlipidemia, unspecified: Secondary | ICD-10-CM | POA: Insufficient documentation

## 2014-02-14 DIAGNOSIS — M1990 Unspecified osteoarthritis, unspecified site: Secondary | ICD-10-CM

## 2014-02-14 DIAGNOSIS — M129 Arthropathy, unspecified: Secondary | ICD-10-CM | POA: Insufficient documentation

## 2014-02-14 DIAGNOSIS — I10 Essential (primary) hypertension: Secondary | ICD-10-CM | POA: Insufficient documentation

## 2014-02-14 DIAGNOSIS — E119 Type 2 diabetes mellitus without complications: Secondary | ICD-10-CM

## 2014-02-14 DIAGNOSIS — K219 Gastro-esophageal reflux disease without esophagitis: Secondary | ICD-10-CM | POA: Insufficient documentation

## 2014-02-14 LAB — GLUCOSE, POCT (MANUAL RESULT ENTRY): POC Glucose: 126 mg/dl — AB (ref 70–99)

## 2014-02-14 LAB — POCT GLYCOSYLATED HEMOGLOBIN (HGB A1C): HEMOGLOBIN A1C: 6.1

## 2014-02-14 MED ORDER — MELOXICAM 15 MG PO TABS: 15.0000 mg | ORAL_TABLET | Freq: Every day | ORAL | Status: DC

## 2014-02-14 NOTE — Progress Notes (Signed)
Pt is here following up on his diabetes.  Pt states that for a few days his hand have been in extreme pain especially when he grabs objects. Pt's nipples and breast seem to be swollen Pt has an interpreter today

## 2014-02-14 NOTE — Patient Instructions (Signed)
Diabetes y Michael Campos fsica (Diabetes and Exercise) Hacer actividad fsica con regularidad es muy importante. No se trata slo de perder peso. Tiene muchos otros beneficios, como por ejemplo:  Mejorar el [place], flexibilidad y resistencia.  Aumenta la densidad sea.  Ayuda a Technical sales engineer.  Disminuye la Air traffic controller.  Aumenta la fuerza muscular.  Reduce el estrs y las tensiones.  Mejora el estado de salud general. Las personas diabticas que realizan actividad fsica tienen beneficios adicionales debido al ejercicio:  Reduce el apetito.  Mejora la utilizacin del azcar (glucosa) por parte del organismo.  Ayuda a disminuir o Special educational needs teacher.  Disminuye la presin arterial.  Ayuda a disminuir los lpidos en sangre (colesterol y triglicridos).  Mejora el uso de la insulina por parte del organismo porque:  Aumenta la sensibilidad del organismo a la insulina.  Reduce las necesidades de insulina del organismo.  Disminuye el riesgo de enfermedad cardaca por la actividad fsica.  [place] colesterol y los triglicridos.  Aumenta los niveles de colesterol bueno (como las lipoprotenas de alta densidad [HDL]) en el organismo.  Disminuye los niveles de glucosa en [place]. SU PLAN DE ACTIVIDAD  Elija una actividad que disfrute y establezca objetivos realistas. Su mdico o educador en diabetes podrn ayudarlo a Conservation officer, nature que lo beneficie. Podr dividir las actividades en 2 o 3 sesiones a lo Teacher, early years/pre. Hacer esto es tan bueno como hacer una sesin prolongada. Las ideas para los ejercicios incluyen:  Lleve a Probation officer.  Utilice las Clinical cytogeneticist del ascensor.  Baile su cancin favorita.  Haga sus ejercicios favoritos con [place]. RECOMENDACIONES PARA REALIZAR EJERCICIOS CUANDO SE TIENE DIABETES TIPO 1 O TIPO 2   Controle la glucosa en sangre antes de comenzar. Si el nivel de glucosa en sangre es de ms de 240  mg/dl, controle las cetonas en la [place]. Si hay cetonas, no realice actividad fsica.  Evite inyectarse insulina en las zonas del cuerpo que ejercitar. Por ejemplo, evite inyectarse insulina en:  Los brazos, si juega al tenis.  Las piernas, si corre.  Lleve un registro de:  Alimentos que consume antes y despus de TEFL teacher.  Los momentos esperables de picos de accin de la insulina.  Los niveles de glucosa en sangre antes y despus de hacer [place].  El tipo y cantidad de [place]  Revise los registros con su mdico. El mdico lo ayudar a Actor pautas para ajustar la cantidad de alimento y las cantidades de insulina antes y despus de Field seismologist ejercicios.  Si toma insulina o agentes hipoglucemiantes por va oral, observe si hay signos y sntomas de hipoglucemia. Ellos son:  Mareos.  Temblores.  Sudoracin.  Escalofros.  Confusin.  Beba gran cantidad de agua mientras hace ejercicios para evitar la deshidratacin o el infarto. Durante la actividad fsica se pierde Chiropodist.  Comente con su mdico antes de comenzar un programa de actividad fsica para verificar que sea seguro para usted. Recuerde, cualquier actividad es mejor que ninguna. Document Released: 09/07/2007 Document Revised: 04/20/2013 [facility] Patient Information 2014 [place], [place].

## 2014-02-14 NOTE — Progress Notes (Signed)
Patient ID: Michael Campos, male   DOB: [birth year], 63 y.o.   MRN: [id number]   Amaree Leeper, is a 63 y.o. male  [id number]  [id number]  DOB - May 15, 1951  Chief Complaint  Patient presents with  . Follow-up        Subjective:   Michael Campos is a 63 y.o. male here today for a follow up visit. Patient has history of hypertension, GERD, dyslipidemia, and diabetes mellitus. Pt is here following up on his diabetes. Patient is on Amaryl and metformin. He claims blood sugars are within acceptable range at home. Pt states that for a few days his hand have been in extreme pain especially when he grabs objects. Pt's nipples and breast seem to be swollen. Pt has an interpreter today. Patient does not smoke cigarettes, she does not drink alcohol. Patient has No headache, No chest pain, No abdominal pain - No Nausea, No new weakness tingling or numbness, No Cough - SOB.  No problems updated.  ALLERGIES: No Known Allergies  PAST MEDICAL HISTORY: Past Medical History  Diagnosis Date  . Diabetes mellitus   . Hypertension   . Hyperlipidemia     MEDICATIONS AT HOME: Prior to Admission medications   Medication Sig Start Date End Date Taking? Authorizing Hersel Mcmeen  Calcium Carbonate-Vitamin D (CALCIUM + D PO) Take by mouth.   Yes Historical Mckinsey Keagle, MD  Cholecalciferol (VITAMIN D PO) Take by mouth.   Yes Historical Dominic Rhome, MD  diclofenac sodium (VOLTAREN) 1 % GEL Apply 2 g topically 4 (four) times daily. 11/14/13  Yes Angelica Chessman, MD  glimepiride (AMARYL) 2 MG tablet Take 1 tablet (2 mg total) by mouth daily before breakfast. 05/16/13  Yes Theodis Blaze, MD  hydrocortisone cream 0.5 % Apply topically 2 (two) times daily. 04/05/13  Yes Angelica Chessman, MD  ibuprofen (ADVIL,MOTRIN) 800 MG tablet Take 1 tablet (800 mg total) by mouth every 8 (eight) hours as needed for pain. 05/16/13  Yes Theodis Blaze, MD  lisinopril (PRINIVIL,ZESTRIL) 5 MG tablet Take 1  tablet (5 mg total) by mouth daily. 11/14/13  Yes Angelica Chessman, MD  Omega-3 Fatty Acids (FISH OIL CONCENTRATE PO) Take by mouth.   Yes Historical Taeja Debellis, MD  omeprazole (PRILOSEC) 40 MG capsule Take 1 capsule (40 mg total) by mouth daily. 11/14/13  Yes Angelica Chessman, MD  rosuvastatin (CRESTOR) 10 MG tablet Take 1 tablet (10 mg total) by mouth daily. 11/14/13  Yes Angelica Chessman, MD  diphenhydrAMINE-zinc acetate (BENADRYL EXTRA STRENGTH) cream Apply topically 3 (three) times daily as needed for itching. 05/16/13   Theodis Blaze, MD  gabapentin (NEURONTIN) 300 MG capsule Take 1 capsule (300 mg total) by mouth 3 (three) times daily. 11/14/13   Angelica Chessman, MD  meloxicam (MOBIC) 15 MG tablet Take 1 tablet (15 mg total) by mouth daily. 02/14/14   Angelica Chessman, MD  metFORMIN (GLUCOPHAGE) 500 MG tablet Take 1 tablet (500 mg total) by mouth 2 (two) times daily with a meal. 11/14/13   Angelica Chessman, MD  naproxen (NAPROSYN) 500 MG tablet Take 1 tablet (500 mg total) by mouth 2 (two) times daily with a meal. 05/16/13   Theodis Blaze, MD  traMADol (ULTRAM) 50 MG tablet Take 1 tablet (50 mg total) by mouth every 8 (eight) hours as needed. 11/14/13   Angelica Chessman, MD     Objective:   Filed Vitals:   02/14/14 1155  BP: 126/79  Pulse: 71  Temp: 97.6 F (36.4 C)  TempSrc: Oral  Resp: 16  Height: 5\' 6"  (1.676 m)  Weight: 178 lb (80.74 kg)  SpO2: 96%    Exam General appearance : Awake, alert, not in any distress. Speech Clear. Not toxic looking HEENT: Atraumatic and Normocephalic, pupils equally reactive to light and accomodation Neck: supple, no JVD. No cervical lymphadenopathy.  Chest:Good air entry bilaterally, no added sounds  CVS: S1 S2 regular, no murmurs.  Abdomen: Bowel sounds present, Non tender and not distended with no gaurding, rigidity or rebound. Extremities: B/L Lower Ext shows no edema, both legs are warm to touch Neurology: Awake alert, and oriented X 3, CN  II-XII intact, Non focal Skin:No Rash Wounds:N/A  Data Review Lab Results  Component Value Date   HGBA1C 6.1 02/14/2014   HGBA1C 6.3 11/14/2013   HGBA1C 5.9 04/05/2013     Assessment & Plan   1. Diabetes  - Glucose (CBG) - HgB A1c is 6.1% today  2. HTN (hypertension) Continue current regimen DASH diet Patient was extensively counseled on nutrition and exercise  3. Arthritis  - Ambulatory referral to Sports Medicine    Aim for 2-3 Carb Choices per meal (30-45 grams) +/- 1 either way  Aim for 0-15 Carbs per snack if hungry  Include protein in moderation with your meals and snacks  Consider reading food labels for Total Carbohydrate and Fat Grams of foods  Consider checking BG at alternate times per day  Continue taking medication as directed Fruit Punch - find one with no sugar  Measure and decrease portions of carbohydrate foods  Make your plate and don't go back for seconds    Return in about 3 months (around 05/17/2014), or if symptoms worsen or fail to improve, for Follow up Pain and comorbidities, Hemoglobin A1C and Follow up, DM.  The patient was given clear instructions to go to ER or return to medical center if symptoms don't improve, worsen or new problems develop. The patient verbalized understanding. The patient was told to call to get lab results if they haven't heard anything in the next week.   This note has been created with Surveyor, quantity. Any transcriptional errors are unintentional.    Angelica Chessman, MD, [place], [place], [place] and [place], [place]   02/14/2014, 12:45 PM

## 2014-02-28 ENCOUNTER — Telehealth: Payer: Self-pay | Admitting: Internal Medicine

## 2014-02-28 NOTE — Telephone Encounter (Signed)
Pt. Needs refill for Crestor 10mg . Please f/u with Pt.

## 2014-03-06 ENCOUNTER — Ambulatory Visit (INDEPENDENT_AMBULATORY_CARE_PROVIDER_SITE_OTHER): Payer: No Typology Code available for payment source | Admitting: Family Medicine

## 2014-03-06 ENCOUNTER — Encounter: Payer: Self-pay | Admitting: Family Medicine

## 2014-03-06 VITALS — BP 121/81 | Ht 66.0 in | Wt 175.0 lb

## 2014-03-06 DIAGNOSIS — M79642 Pain in left hand: Secondary | ICD-10-CM

## 2014-03-06 DIAGNOSIS — M6530 Trigger finger, unspecified finger: Secondary | ICD-10-CM

## 2014-03-06 DIAGNOSIS — M79641 Pain in right hand: Secondary | ICD-10-CM

## 2014-03-06 DIAGNOSIS — M79609 Pain in unspecified limb: Secondary | ICD-10-CM

## 2014-03-06 MED ORDER — MELOXICAM 15 MG PO TABS: 15.0000 mg | ORAL_TABLET | Freq: Every day | ORAL | Status: DC

## 2014-03-06 NOTE — Patient Instructions (Signed)
Fue agradable verte hoy.  Tome la Mobic diaria durante 1 mes. Siga con nosotros en un mes.  Si tiene alguna pregunta o inquietud no Secretary/administrator.

## 2014-03-06 NOTE — Progress Notes (Signed)
   Subjective:    Patient ID: Michael Campos, male    DOB: 27-Feb-1951, 63 y.o.   MRN: [id number]  HPI 63 year old male presents to the clinic today with complaints of bilateral hand pain.  Patient reports that he has had pain in both hands for approximately 10 months.  Pain is located primarily in the first, second and third fingers (PIP joints He reports that his pain is worse at night and first thing in the morning.  Pain improves some with physical activity.  He reports associated weakness in both hands. He has been prescribed meloxicam previously but is not taking regularly.  He denies any recent fevers, weight loss, or other joint pain.  Of note, patient has seen a rheumatologist at [facility] (Dr. Ang) and was told that he did not have "arthritis".  PMH reviewed - significant for diabetes, hyperlipidemia, and hypertension. Social Hx reviewed - Patient is a Nature conservation officer and nonsmoker.  Meds/Allergies reviewed.   Review of Systems Per HPI    Objective:   Physical Exam Filed Vitals:   03/06/14 1325  BP: 121/81   Exam: General: well developed, well nourished, well-appearing Hispanic gentleman. NAD. MSK: Hands/Fingers - Inspection - normal appearing, no erythema or swelling of MCP, PIP, or DIP joints. No ulnar deviation noted. 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 - Palpation - Left hand: 1st, 2nd, and 3rd fingers - PIP joints tender to palpation.  Trigger finger noted with ROM of 1st and 2nd fingers. Right Hand - PIP's of index and middle fingers tender to palpation. ROM decreased in all fingers secondary to pain.  Trigger fingers noted as above. - Muscle Strength - 4/5 hand grip strength bilaterally.   MSK [place]: Ultrasound was used to visualize the PIP joints of the Left 2nd digit and the Right 1st digit. US revealed early degenerative changes and fluid collections consistent with OA.  Procedure:  Trigger finger injection of Left Index finger and middle finger. Consent signed and scanned into record. Medication:  1/2 cc Solumedrol & 1/2 cc Lidocaine 1% without epi Preparation: area cleansed with betadine Time Out taken  Injection  Landmarks identified Above medication was administered directly into the affected area in typical fashion. Patient tolerated well without bleeding or paresthesias  Patient had good range of motion of joint after injection    Assessment & Plan:  See Problem List

## 2014-03-06 NOTE — Assessment & Plan Note (Signed)
Injection of trigger fingers (1st and second) performed today. Patient to follow up in 1 month.  He may need additional trigger finger injections at that time.

## 2014-03-06 NOTE — Progress Notes (Signed)
Patient ID: Michael Campos, male   DOB: 03-01-1951, 63 y.o.   MRN: [id number] Michael Campos Attending Note: I have seen and examined this patient. I have discussed this patient with the resident and reviewed the assessment and plan as documented above. I agree with the resident's findings and plan.

## 2014-03-06 NOTE — Assessment & Plan Note (Signed)
Findings consistent with early osteoarthritis. Patient to take [facility] daily x1 month. Patient to followup with [facility] at that time.

## 2014-03-08 ENCOUNTER — Other Ambulatory Visit: Payer: Self-pay | Admitting: *Deleted

## 2014-03-08 DIAGNOSIS — E785 Hyperlipidemia, unspecified: Secondary | ICD-10-CM

## 2014-03-08 MED ORDER — ROSUVASTATIN CALCIUM 10 MG PO TABS: 10.0000 mg | ORAL_TABLET | Freq: Every day | ORAL | Status: DC

## 2014-03-08 NOTE — Telephone Encounter (Signed)
I sent RX to our pharmacy for pt to pick up

## 2014-04-07 ENCOUNTER — Encounter: Payer: Self-pay | Admitting: Family Medicine

## 2014-04-07 ENCOUNTER — Ambulatory Visit (INDEPENDENT_AMBULATORY_CARE_PROVIDER_SITE_OTHER): Payer: No Typology Code available for payment source | Admitting: Family Medicine

## 2014-04-07 VITALS — BP 124/76 | Ht 66.0 in | Wt 175.0 lb

## 2014-04-07 DIAGNOSIS — M792 Neuralgia and neuritis, unspecified: Secondary | ICD-10-CM

## 2014-04-07 DIAGNOSIS — G5690 Unspecified mononeuropathy of unspecified upper limb: Secondary | ICD-10-CM

## 2014-04-07 DIAGNOSIS — M6530 Trigger finger, unspecified finger: Secondary | ICD-10-CM

## 2014-04-07 MED ORDER — DICLOFENAC SODIUM 1 % TD GEL: 2.0000 g | Freq: Four times a day (QID) | TRANSDERMAL | Status: DC

## 2014-04-07 NOTE — Patient Instructions (Addendum)
Ok to substitute ASPERCREAM--an OTC product

## 2014-04-07 NOTE — Progress Notes (Signed)
   Subjective:    Patient ID: Michael Campos, male    DOB: Oct 12, 1950, 63 y.o.   MRN: [id number]  HPI  Followup trigger finger injections. At last office visit we injected his left index and long finger. He not only has significantly improved his flexion, he is also had increased sensation to soft touch. Prior to the injections he was having a lot of locking into flexion of both his fingers. Now they are very supple and moves without locking. He also notes that he has much better sensation in that part of his hand and previous to the injection therapy. He has continued with the Voltaren gel generally over both his hands which seems to help his neuropathic pain. He is intermittently used the meloxicam and is not sure if it's helping or not.  Review of Systems No swelling or erythema of the hands, no fever, sweats, chills.    Objective:   Physical Exam Vital signs are reviewed GENERAL: Well-developed male no acute distress to HANDS: Ms. seeing the distal portion of his right index finger secondary to amputation. The left index and ring finger have normal flexion without any sticking or triggering. All of his fingers seem to be moving well. Sensation to soft touch is somewhat decreased in the palmar aspect of both hands but symmetrical.       Assessment & Plan:  #1.F/u trigger Finger on left index and ring finger completely resolved after  corticosteroid injection. He can continue Voltaren gel as it seems to be essentially helping his generalized peripheral neuropathy. He can stop or continue or use intermittently the meloxicam. Followup when necessary.

## 2014-05-11 ENCOUNTER — Ambulatory Visit: Payer: No Typology Code available for payment source | Attending: Internal Medicine | Admitting: Internal Medicine

## 2014-05-11 ENCOUNTER — Encounter: Payer: Self-pay | Admitting: Internal Medicine

## 2014-05-11 VITALS — BP 123/73 | HR 58 | Temp 98.0°F | Resp 16 | Ht 66.0 in | Wt 182.0 lb

## 2014-05-11 DIAGNOSIS — Z23 Encounter for immunization: Secondary | ICD-10-CM | POA: Insufficient documentation

## 2014-05-11 DIAGNOSIS — I10 Essential (primary) hypertension: Secondary | ICD-10-CM | POA: Insufficient documentation

## 2014-05-11 DIAGNOSIS — H9190 Unspecified hearing loss, unspecified ear: Secondary | ICD-10-CM | POA: Insufficient documentation

## 2014-05-11 DIAGNOSIS — K219 Gastro-esophageal reflux disease without esophagitis: Secondary | ICD-10-CM | POA: Insufficient documentation

## 2014-05-11 DIAGNOSIS — E785 Hyperlipidemia, unspecified: Secondary | ICD-10-CM | POA: Insufficient documentation

## 2014-05-11 DIAGNOSIS — Z79899 Other long term (current) drug therapy: Secondary | ICD-10-CM | POA: Insufficient documentation

## 2014-05-11 DIAGNOSIS — R209 Unspecified disturbances of skin sensation: Secondary | ICD-10-CM | POA: Insufficient documentation

## 2014-05-11 DIAGNOSIS — G5690 Unspecified mononeuropathy of unspecified upper limb: Secondary | ICD-10-CM | POA: Insufficient documentation

## 2014-05-11 DIAGNOSIS — E119 Type 2 diabetes mellitus without complications: Secondary | ICD-10-CM | POA: Insufficient documentation

## 2014-05-11 LAB — GLUCOSE, POCT (MANUAL RESULT ENTRY): POC GLUCOSE: 127 mg/dL — AB (ref 70–99)

## 2014-05-11 LAB — POCT GLYCOSYLATED HEMOGLOBIN (HGB A1C): Hemoglobin A1C: 6.7

## 2014-05-11 NOTE — Progress Notes (Signed)
Patient ID: Michael Campos, male   DOB: July 07, 1951, 63 y.o.   MRN: [id number]  CC: HTN and DM  HPI:  Patient presents to clinic today for a 3 month follow up of HTN and DM.  He states that he has only been taking amaryl.  He was unsure if he was suppose to take metformin and amaryl together.  He reports that his CBG ranges from 90-120. He works out every morning for at least one hour.    No Known Allergies Past Medical History  Diagnosis Date  . Diabetes mellitus   . Hypertension   . Hyperlipidemia    Current Outpatient Prescriptions on File Prior to Visit  Medication Sig Dispense Refill  . Calcium Carbonate-Vitamin D (CALCIUM + D PO) Take by mouth.      . diclofenac sodium (VOLTAREN) 1 % GEL Apply 2 g topically 4 (four) times daily.  2 Tube  12  . glimepiride (AMARYL) 2 MG tablet Take 1 tablet (2 mg total) by mouth daily before breakfast.  30 tablet  5  . lisinopril (PRINIVIL,ZESTRIL) 5 MG tablet Take 1 tablet (5 mg total) by mouth daily.  90 tablet  3  . meloxicam (MOBIC) 15 MG tablet Take 1 tablet (15 mg total) by mouth daily.  30 tablet  2  . omeprazole (PRILOSEC) 40 MG capsule Take 1 capsule (40 mg total) by mouth daily.  90 capsule  3  . rosuvastatin (CRESTOR) 10 MG tablet Take 1 tablet (10 mg total) by mouth daily.  90 tablet  3  . Cholecalciferol (VITAMIN D PO) Take by mouth.      . diphenhydrAMINE-zinc acetate (BENADRYL EXTRA STRENGTH) cream Apply topically 3 (three) times daily as needed for itching.  28.4 g  0  . gabapentin (NEURONTIN) 300 MG capsule Take 1 capsule (300 mg total) by mouth 3 (three) times daily.  180 capsule  3  . hydrocortisone cream 0.5 % Apply topically 2 (two) times daily.  30 g  0  . metFORMIN (GLUCOPHAGE) 500 MG tablet Take 1 tablet (500 mg total) by mouth 2 (two) times daily with a meal.  180 tablet  3  . Omega-3 Fatty Acids (FISH OIL CONCENTRATE PO) Take by mouth.      . traMADol (ULTRAM) 50 MG tablet Take 1 tablet (50 mg total) by mouth every 8  (eight) hours as needed.  60 tablet  0   No current facility-administered medications on file prior to visit.   History reviewed. No pertinent family history. History   Social History  . Marital Status: Married    Spouse Name: N/A    Number of Children: N/A  . Years of Education: N/A   Occupational History  . Not on file.   Social History Main Topics  . Smoking status: Never Smoker   . Smokeless tobacco: Not on file  . Alcohol Use: No  . Drug Use: No  . Sexual Activity: Not on file   Other Topics Concern  . Not on file   Social History Narrative  . No narrative on file    Review of Systems  HENT: Positive for hearing loss (many years).   Respiratory: Negative.   Cardiovascular: Negative.   Genitourinary: Negative.   Neurological: Positive for tingling (BLE). Negative for dizziness and headaches.      Objective:   Filed Vitals:   05/11/14 1033  BP: 123/73  Pulse: 58  Temp: 98 F (36.7 C)  Resp: 16    Physical Exam  HENT:  Right Ear: External ear normal.  Left Ear: External ear normal.  Cardiovascular: Normal rate, regular rhythm and normal heart sounds.   Pulmonary/Chest: Effort normal and breath sounds normal.  Abdominal: Soft. Bowel sounds are normal.  Musculoskeletal: Normal range of motion. He exhibits no edema and no tenderness.  Neurological: He is alert.  Skin: Skin is warm.     Lab Results  Component Value Date   WBC 6.9 10/25/2008   HGB 14.7 10/25/2008   HCT 43.8 10/25/2008   MCV 91.4 10/25/2008   PLT 355 10/25/2008   Lab Results  Component Value Date   CREATININE 0.68 04/05/2013   BUN 15 04/05/2013   NA 139 04/05/2013   K 4.6 04/05/2013   CL 104 04/05/2013   CO2 26 04/05/2013    Lab Results  Component Value Date   HGBA1C 6.1 02/14/2014   Lipid Panel     Component Value Date/Time   CHOL 203* 01/04/2013 1030   TRIG 201* 01/04/2013 1030   HDL 50 01/04/2013 1030   CHOLHDL 4.1 01/04/2013 1030   VLDL 40 01/04/2013 1030   LDLCALC 113* 01/04/2013 1030        Assessment and plan:   King was seen today for follow-up.  Diagnoses and associated orders for this visit:  Type 2 diabetes mellitus without complication - Glucose (CBG) - HgB A1c May continue with only amaryl. Will d/c metformin from patient medication list since he has not been taking  Essential hypertension Continue lisinopril, BP is well controlled  Need for prophylactic vaccination and inoculation against influenza Received   Return in about 3 months (around 08/10/2014) for DM/HTN.       Chari Manning, NP-C [facility] [phone] 05/26/2014, 5:23 PM

## 2014-05-11 NOTE — Patient Instructions (Signed)
Diabetes and Foot Care Diabetes may cause you to have problems because of poor blood supply (circulation) to your feet and legs. This may cause the skin on your feet to become thinner, break easier, and heal more slowly. Your skin may become dry, and the skin may peel and crack. You may also have nerve damage in your legs and feet causing decreased feeling in them. You may not notice minor injuries to your feet that could lead to infections or more serious problems. Taking care of your feet is one of the most important things you can do for yourself.  HOME CARE INSTRUCTIONS  Wear shoes at all times, even in the house. Do not go barefoot. Bare feet are easily injured.  Check your feet daily for blisters, cuts, and redness. If you cannot see the bottom of your feet, use a mirror or ask someone for help.  Wash your feet with warm water (do not use hot water) and mild soap. Then pat your feet and the areas between your toes until they are completely dry. Do not soak your feet as this can dry your skin.  Apply a moisturizing lotion or petroleum jelly (that does not contain alcohol and is unscented) to the skin on your feet and to dry, brittle toenails. Do not apply lotion between your toes.  Trim your toenails straight across. Do not dig under them or around the cuticle. File the edges of your nails with an emery board or nail file.  Do not cut corns or calluses or try to remove them with medicine.  Wear clean socks or stockings every day. Make sure they are not too tight. Do not wear knee-high stockings since they may decrease blood flow to your legs.  Wear shoes that fit properly and have enough cushioning. To break in new shoes, wear them for just a few hours a day. This prevents you from injuring your feet. Always look in your shoes before you put them on to be sure there are no objects inside.  Do not cross your legs. This may decrease the blood flow to your feet.  If you find a minor scrape,  cut, or break in the skin on your feet, keep it and the skin around it clean and dry. These areas may be cleansed with mild soap and water. Do not cleanse the area with peroxide, alcohol, or iodine.  When you remove an adhesive bandage, be sure not to damage the skin around it.  If you have a wound, look at it several times a day to make sure it is healing.  Do not use heating pads or hot water bottles. They may burn your skin. If you have lost feeling in your feet or legs, you may not know it is happening until it is too late.  Make sure your health care provider performs a complete foot exam at least annually or more often if you have foot problems. Report any cuts, sores, or bruises to your health care provider immediately. SEEK MEDICAL CARE IF:   You have an injury that is not healing.  You have cuts or breaks in the skin.  You have an ingrown nail.  You notice redness on your legs or feet.  You feel burning or tingling in your legs or feet.  You have pain or cramps in your legs and feet.  Your legs or feet are numb.  Your feet always feel cold. SEEK IMMEDIATE MEDICAL CARE IF:   There is increasing redness,   swelling, or pain in or around a wound.  There is a red line that goes up your leg.  Pus is coming from a wound.  You develop a fever or as directed by your health care provider.  You notice a bad smell coming from an ulcer or wound. Document Released: 08/15/2000 Document Revised: 04/20/2013 Document Reviewed: 01/25/2013 ExitCare Patient Information 2015 [employer]. This information is not intended to replace advice given to you by your health care provider. Make sure you discuss any questions you have with your health care provider.  

## 2014-05-11 NOTE — Progress Notes (Signed)
Pt is here following up on his HTN and diabetes.

## 2014-05-15 ENCOUNTER — Ambulatory Visit: Payer: No Typology Code available for payment source | Attending: Internal Medicine

## 2014-05-15 DIAGNOSIS — M1990 Unspecified osteoarthritis, unspecified site: Secondary | ICD-10-CM

## 2014-05-15 DIAGNOSIS — E119 Type 2 diabetes mellitus without complications: Secondary | ICD-10-CM

## 2014-05-15 DIAGNOSIS — I10 Essential (primary) hypertension: Secondary | ICD-10-CM

## 2014-05-15 LAB — LIPID PANEL
CHOL/HDL RATIO: 3 ratio
Cholesterol: 151 mg/dL (ref 0–200)
HDL: 50 mg/dL (ref >39)
LDL Cholesterol: 68 mg/dL (ref 0–99)
Triglycerides: 165 mg/dL — ABNORMAL HIGH (ref <150)
VLDL: 33 mg/dL (ref 0–40)

## 2014-05-15 LAB — HEMOGLOBIN A1C
Hgb A1c MFr Bld: 6.4 % — ABNORMAL HIGH (ref <5.7)
Mean Plasma Glucose: 137 mg/dL — ABNORMAL HIGH (ref <117)

## 2014-06-02 ENCOUNTER — Ambulatory Visit: Payer: No Typology Code available for payment source | Attending: Internal Medicine

## 2014-06-02 ENCOUNTER — Telehealth: Payer: Self-pay | Admitting: Internal Medicine

## 2014-06-02 NOTE — Telephone Encounter (Signed)
Patient said he was called about his Lab tests weren't found. Patient asks if it would be possible to have the Lab tests done today while he is here at the [facility]. Please f/u with Patient.

## 2014-06-05 ENCOUNTER — Other Ambulatory Visit: Payer: No Typology Code available for payment source

## 2014-08-10 ENCOUNTER — Ambulatory Visit: Payer: Self-pay | Attending: Internal Medicine | Admitting: Internal Medicine

## 2014-08-10 ENCOUNTER — Encounter: Payer: Self-pay | Admitting: Internal Medicine

## 2014-08-10 VITALS — BP 127/74 | HR 61 | Temp 97.4°F | Resp 16 | Ht 66.0 in | Wt 180.0 lb

## 2014-08-10 DIAGNOSIS — E785 Hyperlipidemia, unspecified: Secondary | ICD-10-CM | POA: Insufficient documentation

## 2014-08-10 DIAGNOSIS — E119 Type 2 diabetes mellitus without complications: Secondary | ICD-10-CM | POA: Insufficient documentation

## 2014-08-10 DIAGNOSIS — I10 Essential (primary) hypertension: Secondary | ICD-10-CM | POA: Insufficient documentation

## 2014-08-10 DIAGNOSIS — K219 Gastro-esophageal reflux disease without esophagitis: Secondary | ICD-10-CM | POA: Insufficient documentation

## 2014-08-10 DIAGNOSIS — Z79899 Other long term (current) drug therapy: Secondary | ICD-10-CM | POA: Insufficient documentation

## 2014-08-10 LAB — POCT GLYCOSYLATED HEMOGLOBIN (HGB A1C): HEMOGLOBIN A1C: 6.3

## 2014-08-10 LAB — GLUCOSE, POCT (MANUAL RESULT ENTRY): POC GLUCOSE: 138 mg/dL — AB (ref 70–99)

## 2014-08-10 MED ORDER — GLIMEPIRIDE 2 MG PO TABS: 2.0000 mg | ORAL_TABLET | Freq: Every day | ORAL | Status: DC

## 2014-08-10 MED ORDER — ROSUVASTATIN CALCIUM 10 MG PO TABS: 10.0000 mg | ORAL_TABLET | Freq: Every day | ORAL | Status: DC

## 2014-08-10 MED ORDER — LISINOPRIL 5 MG PO TABS: 5.0000 mg | ORAL_TABLET | Freq: Every day | ORAL | Status: DC

## 2014-08-10 MED ORDER — OMEPRAZOLE 40 MG PO CPDR: 40.0000 mg | DELAYED_RELEASE_CAPSULE | Freq: Every day | ORAL | Status: DC

## 2014-08-10 MED ORDER — MELOXICAM 15 MG PO TABS: 15.0000 mg | ORAL_TABLET | Freq: Every day | ORAL | Status: DC

## 2014-08-10 NOTE — Progress Notes (Signed)
Pt is here following up on his diabetes. Pt has no C.C. today

## 2014-08-10 NOTE — Progress Notes (Signed)
Patient ID: Michael Campos, male   DOB: 22-Jan-1951, 63 y.o.   MRN: [id number]  SUBJECTIVE: 63 y.o. male for follow up of diabetes. Diabetic Review of Systems - medication compliance: compliant all of the time, diabetic diet compliance: compliant all of the time, once daily, home glucose monitoring: is performed regularly, last eye exam approximately 1.5 years ago.  Other symptoms and concerns: neuropathy improved.  Current Outpatient Prescriptions  Medication Sig Dispense Refill  . Calcium Carbonate-Vitamin D (CALCIUM + D PO) Take by mouth.    . diclofenac sodium (VOLTAREN) 1 % GEL Apply 2 g topically 4 (four) times daily. 2 Tube 12  . glimepiride (AMARYL) 2 MG tablet Take 1 tablet (2 mg total) by mouth daily before breakfast. 30 tablet 5  . lisinopril (PRINIVIL,ZESTRIL) 5 MG tablet Take 1 tablet (5 mg total) by mouth daily. 90 tablet 3  . meloxicam (MOBIC) 15 MG tablet Take 1 tablet (15 mg total) by mouth daily. 30 tablet 2  . omeprazole (PRILOSEC) 40 MG capsule Take 1 capsule (40 mg total) by mouth daily. 90 capsule 3  . rosuvastatin (CRESTOR) 10 MG tablet Take 1 tablet (10 mg total) by mouth daily. 90 tablet 3  . gabapentin (NEURONTIN) 300 MG capsule Take 1 capsule (300 mg total) by mouth 3 (three) times daily. (Patient not taking: Reported on 08/10/2014) 180 capsule 3   No current facility-administered medications for this visit.    OBJECTIVE: Appearance: alert, well appearing, and in no distress, oriented to person, place, and time and well hydrated. BP 127/74 mmHg  Pulse 61  Temp(Src) 97.4 F (36.3 C) (Oral)  Resp 16  Ht 5\' 6"  (1.676 m)  Wt 180 lb (81.647 kg)  BMI 29.07 kg/m2  SpO2 96%  Exam: heart sounds normal rate, regular rhythm, normal S1, S2, no murmurs, rubs, clicks or gallops, chest clear, no carotid bruits, feet: warm, good capillary refill, dry cracking heels, nail exam normal nails without lesions and ingrown nail at right great toe, normal DP and PT pulses and  normal monofilament exam  ASSESSMENT: Diabetes Mellitus: well controlled  PLAN: See orders for this visit as documented in the electronic medical record. Issues reviewed with him: diabetic diet discussed in detail, written exchange diet given, low cholesterol diet, weight control and daily exercise discussed, foot care discussed and Podiatry visits discussed, annual eye examinations at Ophthalmology discussed and labs immediately prior to next visit.       Assessment and plan:   [name] was seen today for follow-up.  Diagnoses and associated orders for this visit:  Type 2 diabetes mellitus without complication - Glucose (CBG) - HgB A1c - Ambulatory referral to Podiatry - glimepiride (AMARYL) 2 MG tablet; Take 1 tablet (2 mg total) by mouth daily before breakfast.  Essential hypertension - lisinopril (PRINIVIL,ZESTRIL) 5 MG tablet; Take 1 tablet (5 mg total) by mouth daily. Patient blood pressure is stable and may continue on current medication.  Education on diet, exercise, and modifiable risk factors discussed. Will obtain appropriate labs as needed. Will follow up in 3-6 months.   Dyslipidemia - rosuvastatin (CRESTOR) 10 MG tablet; Take 1 tablet (10 mg total) by mouth daily. A low fat, low cholesterol is discussed with the patient, and a written copy is given to him. Will recheck lipid at next exam  Gastroesophageal reflux disease, esophagitis presence not specified - omeprazole (PRILOSEC) 40 MG capsule; Take 1 capsule (40 mg total) by mouth daily. Discussed diet and weight with patient relating to acid reflux.  Went over things that may exacerbate acid reflux such as tomatoes, spicy foods, coffee, carbonated beverages, chocolates, etc.  Advised patient to avoid laying down at least two hours after meals and sleep with HOB elevated.   Other Orders - meloxicam (MOBIC) 15 MG tablet; Take 1 tablet (15 mg total) by mouth daily.    Return for end of March or April DM/HTN.  Due  to language barrier, an interpreter was present during the history-taking and subsequent discussion (and for part of the physical exam) with this patient.       Chari Manning, NP-C [facility] [phone] 08/10/2014, 12:16 PM

## 2014-08-10 NOTE — Patient Instructions (Signed)
Uña del pie encarnada °(Ingrown Toenail) °El profesional que lo asiste le ha diagnosticado que usted tiene la uña del pie encarnada. Esto se produce cuando un borde afilado de la uña crece dentro de la piel. Entre las causas, se incluyen cortarse las uñas muy atrás, o usar zapatos que no calcen bien. Actividades que impliquen detenerse rápidamente (básquet, tenis) causan traumatismos en los dedos que ocasionan la uña encarnada. °INSTRUCCIONES PARA EL CUIDADO DOMICILIARIO °· Remoje todo el pie en agua tibia jabonosa durante 20 minutos, tres veces por día. °· Puede separar el borde de la uña de la piel que duele, colocando un pequeño trozo de algodón bajo la esquina de la uña. Tenga cuidado de no hundirla (traumatizar) y ocasionar una lesión mayor. °· Use zapatos que calcen bien. Mientras tenga este problema, puede ser útil usar sandalias. °· Corte las uñas regularmente y con cuidado. Corte las uñas en línea recta y no en curva. Esto evitará que la piel lesione las esquinas de las uñas. °· Mantenga los pies limpios y secos. °· Puede serle útil usar muletas a comienzo del tratamiento siente dolor al caminar. °· Si le prescriben antibióticos, tómelos tal como se le indicó. °· Regrese para controlar la herida dentro de dos días, o según le hayan indicado. °· Utilice los medicamentos de venta libre o de prescripción para el dolor, el malestar o la fiebre, según se lo indique el profesional que lo asiste. °SOLICITE ATENCIÓN MÉDICA DE INMEDIATO SI: °· Tiene fiebre. °· Aumenta el dolor, el enrojecimiento, la hinchazón o el calor en el lugar de la herida. °· El dedo no se cura en el término de 7 días. °Si el tratamiento conservador no tiene éxito, será necesario que se someta a la extirpación quirúrgica de una porción o de toda la uña. °ESTÉ SEGURO QUE:  °· Comprende las instrucciones para el alta médica. °· Controlará su enfermedad. °· Solicitará atención médica de inmediato según las indicaciones. °Document Released:  08/18/2005 Document Revised: 11/10/2011 °ExitCare® Patient Information ©2015 [employer]. This information is not intended to replace advice given to you by your health care provider. Make sure you discuss any questions you have with your health care provider. ° °

## 2014-08-11 LAB — MICROALBUMIN, URINE: MICROALB UR: 0.8 mg/dL (ref <2.0)

## 2014-09-08 ENCOUNTER — Ambulatory Visit: Payer: Self-pay | Admitting: Podiatrist

## 2014-10-09 ENCOUNTER — Other Ambulatory Visit: Payer: Self-pay | Admitting: Internal Medicine

## 2014-10-11 ENCOUNTER — Encounter: Payer: Self-pay | Admitting: Internal Medicine

## 2014-10-11 ENCOUNTER — Ambulatory Visit: Payer: Self-pay | Attending: Internal Medicine | Admitting: Internal Medicine

## 2014-10-11 VITALS — BP 109/70 | HR 70 | Temp 98.4°F | Resp 16 | Ht 67.0 in | Wt 177.0 lb

## 2014-10-11 DIAGNOSIS — J069 Acute upper respiratory infection, unspecified: Secondary | ICD-10-CM | POA: Insufficient documentation

## 2014-10-11 DIAGNOSIS — Z79899 Other long term (current) drug therapy: Secondary | ICD-10-CM | POA: Insufficient documentation

## 2014-10-11 DIAGNOSIS — E785 Hyperlipidemia, unspecified: Secondary | ICD-10-CM | POA: Insufficient documentation

## 2014-10-11 DIAGNOSIS — E119 Type 2 diabetes mellitus without complications: Secondary | ICD-10-CM | POA: Insufficient documentation

## 2014-10-11 DIAGNOSIS — B9789 Other viral agents as the cause of diseases classified elsewhere: Secondary | ICD-10-CM

## 2014-10-11 DIAGNOSIS — I10 Essential (primary) hypertension: Secondary | ICD-10-CM | POA: Insufficient documentation

## 2014-10-11 LAB — GLUCOSE, POCT (MANUAL RESULT ENTRY): POC Glucose: 157 mg/dl — AB (ref 70–99)

## 2014-10-11 MED ORDER — GUAIFENESIN ER 600 MG PO TB12: 600.0000 mg | ORAL_TABLET | Freq: Two times a day (BID) | ORAL | Status: DC

## 2014-10-11 MED ORDER — FLUTICASONE PROPIONATE 50 MCG/ACT NA SUSP: 2.0000 | Freq: Every day | NASAL | Status: DC

## 2014-10-11 NOTE — Progress Notes (Signed)
Patient ID: Michael Campos, male   DOB: April 22, 1951, 64 y.o.   MRN: [id number]  CC: cough, phlegm  HPI: Michael Campos is a 64 y.o. male here today for a follow up visit.  Patient has past medical history of T2DM, HTN, and HLD.  Patient presents to clinic today with c/o of cough, back ache, and phlegm for the past 2 weeks. The cough is more severe at night, with greenish mucous.  He has been around a sick grandchild.  His wife states that he coughs all day which causes him to not sleep.  He has tried robitussin, tea, and theraflu.  He denies fever, chills, nausea, vomiting, diarrhea, or SOB. Has symptoms of nasal congestion, sore throat, and head congestion.  Patient has No headache, No chest pain, No abdominal pain - No Nausea, No new weakness tingling or numbness, no SOB.  No Known Allergies Past Medical History  Diagnosis Date  . Diabetes mellitus   . Hypertension   . Hyperlipidemia    Current Outpatient Prescriptions on File Prior to Visit  Medication Sig Dispense Refill  . Calcium Carbonate-Vitamin D (CALCIUM + D PO) Take by mouth.    . diclofenac sodium (VOLTAREN) 1 % GEL Apply 2 g topically 4 (four) times daily. 2 Tube 12  . glimepiride (AMARYL) 2 MG tablet Take 1 tablet (2 mg total) by mouth daily before breakfast. 30 tablet 5  . lisinopril (PRINIVIL,ZESTRIL) 5 MG tablet Take 1 tablet (5 mg total) by mouth daily. 30 tablet 5  . omeprazole (PRILOSEC) 40 MG capsule Take 1 capsule (40 mg total) by mouth daily. 30 capsule 4  . rosuvastatin (CRESTOR) 10 MG tablet Take 1 tablet (10 mg total) by mouth daily. 30 tablet 4  . TRUETEST TEST test strip USE TO TEST BLOOD SUGARS AS DIRECTED BY PHYSICIAN 50 each 12  . gabapentin (NEURONTIN) 300 MG capsule Take 1 capsule (300 mg total) by mouth 3 (three) times daily. (Patient not taking: Reported on 08/10/2014) 180 capsule 3  . meloxicam (MOBIC) 15 MG tablet Take 1 tablet (15 mg total) by mouth daily. (Patient not taking: Reported on  10/11/2014) 30 tablet 2   No current facility-administered medications on file prior to visit.   History reviewed. No pertinent family history. History   Social History  . Marital Status: Married    Spouse Name: N/A  . Number of Children: N/A  . Years of Education: N/A   Occupational History  . Not on file.   Social History Main Topics  . Smoking status: Never Smoker   . Smokeless tobacco: Not on file  . Alcohol Use: No  . Drug Use: No  . Sexual Activity: Not on file   Other Topics Concern  . Not on file   Social History Narrative    Review of Systems: See HPI   Objective:   Filed Vitals:   10/11/14 1439  BP: 109/70  Pulse: 70  Temp: 98.4 F (36.9 C)  Resp: 16    Physical Exam  HENT:  Right Ear: External ear normal.  Left Ear: External ear normal.  Mouth/Throat: Oropharynx is clear and moist.  Eyes: Conjunctivae are normal. Pupils are equal, round, and reactive to light. Right eye exhibits no discharge. Left eye exhibits no discharge.  Neck: Normal range of motion.  Cardiovascular: Normal rate, regular rhythm and normal heart sounds.   Pulmonary/Chest: Effort normal and breath sounds normal. He has no wheezes. He exhibits no tenderness.  Abdominal: Soft. Bowel sounds are normal.  Lymphadenopathy:    He has no cervical adenopathy.  Neurological: He is alert.     Lab Results  Component Value Date   WBC 6.9 10/25/2008   HGB 14.7 10/25/2008   HCT 43.8 10/25/2008   MCV 91.4 10/25/2008   PLT 355 10/25/2008   Lab Results  Component Value Date   CREATININE 0.68 04/05/2013   BUN 15 04/05/2013   NA 139 04/05/2013   K 4.6 04/05/2013   CL 104 04/05/2013   CO2 26 04/05/2013    Lab Results  Component Value Date   HGBA1C 6.3 08/10/2014   Lipid Panel     Component Value Date/Time   CHOL 151 05/15/2014 0913   TRIG 165* 05/15/2014 0913   HDL 50 05/15/2014 0913   CHOLHDL 3.0 05/15/2014 0913   VLDL 33 05/15/2014 0913   LDLCALC 68 05/15/2014 0913        Assessment and plan:   [name] was seen today for follow-up.  Diagnoses and all orders for this visit:  Type 2 diabetes mellitus without complication Orders: -     Glucose (CBG)  Viral URI with cough Orders: -     guaiFENesin (MUCINEX) 600 MG 12 hr tablet; Take 1 tablet (600 mg total) by mouth 2 (two) times daily. -     fluticasone (FLONASE) 50 MCG/ACT nasal spray; Place 2 sprays into both nostrils daily. If no improvement in next 3-4 days patient will call clinic back for further instructions.    Due to language barrier, an interpreter was present during the history-taking and subsequent discussion (and for part of the physical exam) with this patient.  Return in about 3 months (around 01/09/2015).       Chari Manning, NP-C [facility] [phone] 10/11/2014, 3:03 PM

## 2014-10-11 NOTE — Progress Notes (Signed)
Pt is here following up on his HTN, diabetes and his hyperlipidemia. Pt states that he has been sick and he has been coughing so hard that his upper back has been very painful for about 2 weeks. Pt has an interpreter.

## 2014-10-11 NOTE — Patient Instructions (Signed)
May get robitussin OTC to help with night time cough    Infeccin de las vas areas superiores en los adultos (Upper Respiratory Infection, Adult)  La infeccin respiratoria de las vas areas superiores se conoce tambin como resfro comn. Las vas areas superiores [employer] senos nasales, la garganta, la trquea, y los bronquios. Los bronquios son las vas areas que conducen el aire a los pulmones. La mayor parte de las personas mejora luego de una [place], Armed forces training and education officer los sntomas pueden durar [employer]. La tos residual puede durar ms. CAUSAS Varios tipos de virus pueden causar la infeccin de los tejidos que cubren las vas areas superiores. Los tejidos se irritan y se inflaman y se originan secreciones. Tambin es frecuente la produccin de moco. El resfro es contagioso. El virus se disemina fcilmente a otras personas por contacto oral. Aqu se incluyen los besos, el compartir un vaso y el toser o Brewing technologist. Tambin puede diseminarse tocndose la boca o la [place] y luego tocando una superficie que luego tocan Producer, television/film/video.  SNTOMAS Los sntomas se desarrollan entre uno y tres das luego de Dietitian en contacto con el virus. Pueden variar de Ardelia Mems persona a otra. Incluyen:  Secrecin nasal.  Estornudos  Congestin nasal.  Irritacin de los senos nasales.  Dolor de Investment banker, operational.  Prdida de la voz (laringitis).  Tos.  Fatiga.  Dolores musculares.  Prdida del apetito.  Dolor de [place].  Fiebre no muy elevada. DIAGNSTICO Puede diagnosticarse a s mismo la infeccin respiratoria, segn los sntomas habituales, ya que la mayor parte de las personas se resfra dos o tres veces al ao. El profesional puede confirmarlo basndose en el examen fsico. Lo ms importante es que el profesional verifique que los sntomas no se deben a otra enfermedad como anginas, sinusitis, neumona, asma o epiglotitis. Para diagnosticar el resfrio comn, no es necesario que haga anlisis de  [place], pruebas en la garganta o radiografas, pero en algunos casos puede ser de utilidad para excluir otros problemas ms graves. El mdico decidir si necesita otras pruebas. RIESGOS Y COMPLICACIONES Tendr mayor riesgo de sufrir un resfro grave si consume cigarrillos, sufre una enfermedad cardaca (como insuficiencia cardaca) o pulmonar crnica (como asma) o si tiene un debilitamiento del sistema inmunolgico. Las personas muy jvenes o muy mayores tienen riesgo de sufrir infecciones ms graves. La sinusitis bacteriana, las infecciones del odo medio y la neumona bacteriana pueden complicar el resfro comn. El resfro puede exacerbar el asma y la enfermedad pulmonar obstructiva crnica. En algunos casos estas complicaciones requieren la atencin en un servicio de emergencias y pueden poner en peligro la vida. PREVENCIN La mejor manera de protegerse para no contraer un resfro es Theatre manager una buena higiene. Evite el contacto bucal o de las manos con personas con sntomas de resfro. Si se produce el contacto, lvese las manos con frecuencia. No hay pruebas firmes que indiquen que la vitamina C, la vitamina E, la equincea o la actividad fsica reduzcan las posibilidades de tener una infeccin. Sin embargo, siempre se recomienda Scientific laboratory technician y Lucilla Edin buena nutricin. TRATAMIENTO El tratamiento est dirigido a Herbalist sntomas. Esta enfermedad no tiene [place]. Los antibiticos no son eficaces, ya que esta infeccin la causa un virus y no una bacteria. El tratamiento incluye:  Aumente la ingesta de lquidos. Consumo de bebidas deportivas, que proporcionan electrolitos,azcares e hidratacin.  Inhale vapor caliente (de un vaporizador o de la ducha).  Tomar sopa de pollo u otros lquidos claros, y [employer]  nutricin.  Descanse lo suficiente.  Haga grgaras o coma pastillas para [employer].  Control de la fiebre con ibuprofeno o acetaminofen, segn las indicaciones  del mdico.  Aumento del uso del inhalador, si sufre asma. Las pastillas y los geles de zinc durante las primeras 24 horas de iniciado el resfro comn, pueden disminuir la duracin y Public house manager la gravedad de los sntomas. Los medicamentos para Conservation officer, historic buildings pueden disminuir la fiebre, Public house manager los dolores musculares y Conservation officer, historic buildings de Investment banker, operational. Se dispone de una gran variedad de medicamentos de venta libre para tratar la congestin y la secrecin nasal. El profesional podr recomendarle inhalantes para los otros sntomas. INSTRUCCIONES PARA EL CUIDADO DOMICILIARIO  Utilice los medicamentos de venta libre o de prescripcin para Conservation officer, historic buildings, el malestar o la [place], segn se lo indique el profesional que lo asiste.  Utilice un vaporizador caliente o inhale vapor, haciendo salir agua de la ducha para aumentar la humedad [place]. Esto mantendr las secreciones hmedas y Arts development officer ms fcil respirar.  Beba gran cantidad de lquido para mantener la orina de tono claro o color amarillo plido.  Descanse todo lo que pueda.  Regrese a su trabajo cuando la temperatura se haya normalizado, o cuando el profesional que lo asiste se lo indique. Quizs sea necesario que permanezca en su casa durante un tiempo ms prolongado para Industrial/product designer a Producer, television/film/video. Tambin puede utilizar un barbijo y ser cuidadoso con el lavado de manos para evitar la diseminacin del virus. SOLICITE ATENCIN MDICA SI:  Luego de los primeros das siente que empeora en vez de [place].  Necesita que Software engineer brinde ms informacin relacionada con los medicamentos para [employer] sntomas.  Siente escalofros, le falta el aire o escupe moco de color marrn o rojo. Estos pueden ser sntomas de neumona.  Tiene una secrecin nasal de color amarillo o marrn, o siente dolor en el rostro, especialmente cuando se inclina hacia adelante. Estos pueden ser sntomas de sinusitis.  Tiene fiebre, siente el cuello hinchado, tiene dolor al  tragar u observa manchas blancas en el fondo de la garganta. Estos pueden ser sntomas de angina por estreptococo. Dana Allan ATENCIN MDICA DE INMEDIATO SI:  Jaclynn Guarneri.  Comienza a sentir Clinical cytogeneticist de cabeza intenso o persistente, dolor de odos, en el seno nasal o en el pecho.  Tiene tos y esta se prolonga demasiado, tose y escupe sangre, la mucosidad habitual se modifica (si tiene una enfermedad pulmonar crnica) o respira con dificultad.  Siente rigidez en el cuello o dolor de cabeza intenso. Document Released: 05/28/2005 Document Revised: 11/10/2011 [facility] Patient Information 2015 [place]. This information is not intended to replace advice given to you by your health care provider. Make sure you discuss any questions you have with your health care provider.

## 2014-10-18 ENCOUNTER — Telehealth: Payer: Self-pay | Admitting: *Deleted

## 2014-10-18 NOTE — Telephone Encounter (Signed)
Pt stated feeling better no coughing today

## 2014-10-18 NOTE — Telephone Encounter (Signed)
-----   Message from Lance Bosch, NP sent at 10/15/2014 11:44 PM EST ----- Regarding: call pt Please call patient and find out how he has been feeling, if he has had any improvement in symptoms of upper respiratory infection. Thanks

## 2014-10-31 ENCOUNTER — Ambulatory Visit: Payer: Self-pay | Attending: Internal Medicine

## 2014-11-02 ENCOUNTER — Telehealth: Payer: Self-pay | Admitting: Internal Medicine

## 2014-11-02 NOTE — Telephone Encounter (Signed)
Patient requested a refill on all of his current medications, please f/u with pt. His last office visit was on 10/11/14.

## 2014-11-02 NOTE — Telephone Encounter (Signed)
Left voice message, pt has refills at the pharmacy

## 2014-11-04 ENCOUNTER — Emergency Department (INDEPENDENT_AMBULATORY_CARE_PROVIDER_SITE_OTHER)
Admission: EM | Admit: 2014-11-04 | Discharge: 2014-11-04 | Disposition: A | Discharge status: Home / Self Care | Payer: Self-pay | Source: Home / Self Care | Attending: Family Medicine | Admitting: Family Medicine

## 2014-11-04 ENCOUNTER — Encounter (HOSPITAL_COMMUNITY): Payer: Self-pay | Admitting: Emergency Medicine

## 2014-11-04 DIAGNOSIS — M546 Pain in thoracic spine: Secondary | ICD-10-CM

## 2014-11-04 LAB — POCT I-STAT, CHEM 8
BUN: 16 mg/dL (ref 6–23)
CALCIUM ION: 1.2 mmol/L (ref 1.13–1.30)
CHLORIDE: 102 mmol/L (ref 96–112)
CREATININE: 0.6 mg/dL (ref 0.50–1.35)
GLUCOSE: 156 mg/dL — AB (ref 70–99)
HEMATOCRIT: 43 % (ref 39.0–52.0)
Hemoglobin: 14.6 g/dL (ref 13.0–17.0)
POTASSIUM: 3.8 mmol/L (ref 3.5–5.1)
Sodium: 140 mmol/L (ref 135–145)
TCO2: 23 mmol/L (ref 0–100)

## 2014-11-04 LAB — POCT URINALYSIS DIP (DEVICE)
Bilirubin Urine: NEGATIVE
Glucose, UA: NEGATIVE mg/dL
Ketones, ur: NEGATIVE mg/dL
Leukocytes, UA: NEGATIVE
Nitrite: NEGATIVE
Protein, ur: NEGATIVE mg/dL
SPECIFIC GRAVITY, URINE: 1.025 (ref 1.005–1.030)
UROBILINOGEN UA: 0.2 mg/dL (ref 0.0–1.0)
pH: 6.5 (ref 5.0–8.0)

## 2014-11-04 MED ORDER — IBUPROFEN 800 MG PO TABS: 800.0000 mg | ORAL_TABLET | Freq: Three times a day (TID) | ORAL | Status: DC

## 2014-11-04 MED ORDER — HYDROCODONE-ACETAMINOPHEN 5-325 MG PO TABS: 1.0000 | ORAL_TABLET | Freq: Four times a day (QID) | ORAL | Status: DC | PRN

## 2014-11-04 MED ORDER — TERAZOSIN HCL 1 MG PO CAPS: ORAL_CAPSULE | ORAL | Status: DC
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
## 2014-11-04 NOTE — Discharge Instructions (Signed)
Dolor de espalda en el adulto (Back Pain, Adult)  El dolor de cintura es frecuente. Aproximadamente 1 de cada 5 personas lo sufren.La causa rara vez pone en peligro la vida. Con frecuencia mejora luego de algn tiempo.Alrededor de la mitad de las personas que sufren un inicio sbito de dolor de cintura, se sentirn mejor luego de 2 semanas. Aproximadamente 8 de cada 10 se sentirn mejor luego de 6 semanas.  CAUSAS  Algunas causas comunes son:   Distensin de los msculos o ligamentos que sostienen la columna vertebral.  Desgaste (degeneracin) de los discos vertebrales.  Artritis.  Traumatismos directos en la espalda. DIAGNSTICO  La mayor parte de las veces, la causa directa no se conoce.Sin embargo, Conservation officer, historic buildings puede tratarse efectivamente an cuando no se Community education officer.Una de las formas ms precisas de asegurar que la causa del dolor no constituye un peligro es responder a las preguntas del mdico acerca de su salud y sus sntomas. Si el mdico necesita ms informacin, podr indicar anlisis de laboratorio o Optometrist un diagnstico por imgenes (radiografas o Health visitor).Sin embargo, aunque las [employer] modificaciones, generalmente no es necesaria la [place].  INSTRUCCIONES PARA EL CUIDADO EN EL HOGAR  En algunas personas, el dolor de espalda vuelve.Como rara vez es peligroso, los pacientes pueden aprender a Education administrator.   Mantngase activo. Si permanece sentado o de pie mucho tiempo en el mismo lugar, se tensiona la espalda.  No se siente, maneje ni se quede parado en un mismo lugar por ms de 30 minutos. Realice caminatas cortas en superficies planas ni bien el dolor haya cedido. Trate de Orthoptist tiempo que camina .  No se quede en la cama.Si hace reposo durante ms de 1 o 2 das, puede Geologist, engineering.  No evite los ejercicios ni el trabajo.El cuerpo est hecho para moverse.No es peligroso estar [place], aunque le duela la  espalda.La espalda se curar ms rpido si contina sus actividades antes de que el dolor se vaya.  Preste atencin a su cuerpo cuando se incline y se levante. Muchas personas sienten menos molestias cuando levantan objetos si doblan las rodillas, mantienen la carga cerca del cuerpo y evitan torcerse. Generalmente, las posiciones ms cmodas son las que ejercen menos tensin en la espalda en recuperacin.  Encuentre una posicin cmoda para dormir. Utilice un colchn firme y recustese de [place]. Doble ligeramente sus rodillas. Si se recuesta sobre su espalda, coloque una almohada debajo de sus rodillas.  Tome slo medicamentos de venta libre o recetados, segn las indicaciones del mdico. Los medicamentos de venta libre para Glass blower/designer y reducir Futures trader, son los que en general ms ayudan.El mdico podr prescribirle relajantes musculares.Estos medicamentos calman el dolor de modo que pueda retornar a sus actividades normales y a Marine scientist.  Aplique hielo sobre la zona lesionada.  Ponga el hielo en una bolsa plstica.  Colquese una toalla entre la piel y la bolsa de hielo.  Deje la bolsa de hielo durante 15 a 20 minutos 3 a 4 veces por da, durante los primeros 2  3 das. Luego podr alternar Lyndal Pulley calor y 60 para reducir Conservation officer, historic buildings y los espasmos.  Consulte a su mdico si puede tratar de hacer ejercicios para la espalda y recibir un masaje suave. Pueden ser beneficiosos.  Evite sentirse ansioso o estresado.El estrs aumenta la tensin muscular y puede empeorar el dolor de espalda.Es importante reconocer cuando est ansioso o estresado y aprender la forma  de controlarlos.El ejercicio es una gran opcin. SOLICITE ATENCIN MDICA SI:   Siente un dolor que no se alivia con reposo o medicamentos.  El dolor no mejora en 1 semana.  Desarrolla nuevos sntomas.  No se siente bien en general. SOLICITE ATENCIN MDICA DE INMEDIATO SI:  Siente un dolor  que se irradia desde la espalda hacia sus piernas.  Desarrolla nuevos problemas en el intestino o la vejiga.  Siente debilidad o adormecimiento inusual en sus brazos o piernas.  Presenta nuseas o vmitos.  Presenta dolor abdominal.  Se siente desfalleciente. Document Released: 08/18/2005 Document Revised: 02/17/2012 [facility] Patient Information 2015 [place], [place]. This information is not intended to replace advice given to you by your health care provider. Make sure you discuss any questions you have with your health care provider.

## 2014-11-04 NOTE — ED Notes (Signed)
C/o lower back pain onset yest Reports he fell on buttocks 4 days ago Vincenza Hews he went to exercise and was working; lifting heavy objects Denies urinary sx Slow gait; no signs of acute distress.

## 2014-11-06 ENCOUNTER — Telehealth: Payer: Self-pay | Admitting: Internal Medicine

## 2014-11-06 NOTE — Telephone Encounter (Signed)
Pt would like to know more about his results from the urine testing at urgent care. They told him he had kidney stones and he is concerned. He is wondering if its necessary to schedule an appt to discuss this further or if he would need to get x-rays done. Please follow up with pt.

## 2014-12-23 ENCOUNTER — Emergency Department (HOSPITAL_COMMUNITY): Payer: Self-pay

## 2014-12-23 ENCOUNTER — Emergency Department (HOSPITAL_COMMUNITY): Payer: Self-pay | Admitting: Certified Registered Nurse Anesthetist

## 2014-12-23 ENCOUNTER — Encounter (HOSPITAL_COMMUNITY): Payer: Self-pay | Admitting: *Deleted

## 2014-12-23 ENCOUNTER — Emergency Department (HOSPITAL_COMMUNITY): Payer: No Typology Code available for payment source | Admitting: Certified Registered Nurse Anesthetist

## 2014-12-23 ENCOUNTER — Emergency Department (INDEPENDENT_AMBULATORY_CARE_PROVIDER_SITE_OTHER)
Admission: EM | Admit: 2014-12-23 | Discharge: 2014-12-23 | Disposition: A | Discharge status: Other Acute Inpatient Hospital | Payer: No Typology Code available for payment source | Source: Home / Self Care | Attending: Family Medicine | Admitting: Family Medicine

## 2014-12-23 ENCOUNTER — Encounter (HOSPITAL_COMMUNITY)
Admission: EM | Disposition: A | Discharge status: Home / Self Care | Payer: Self-pay | Source: Home / Self Care | Attending: Emergency Medicine

## 2014-12-23 ENCOUNTER — Ambulatory Visit (HOSPITAL_COMMUNITY)
Admission: EM | Admit: 2014-12-23 | Discharge: 2014-12-23 | Disposition: A | Discharge status: Home / Self Care | Payer: Self-pay | Attending: Emergency Medicine | Admitting: Emergency Medicine

## 2014-12-23 ENCOUNTER — Encounter (HOSPITAL_COMMUNITY): Payer: Self-pay | Admitting: Nurse Practitioner

## 2014-12-23 DIAGNOSIS — S68621A Partial traumatic transphalangeal amputation of left index finger, initial encounter: Secondary | ICD-10-CM | POA: Insufficient documentation

## 2014-12-23 DIAGNOSIS — E785 Hyperlipidemia, unspecified: Secondary | ICD-10-CM | POA: Insufficient documentation

## 2014-12-23 DIAGNOSIS — Z791 Long term (current) use of non-steroidal anti-inflammatories (NSAID): Secondary | ICD-10-CM | POA: Insufficient documentation

## 2014-12-23 DIAGNOSIS — I10 Essential (primary) hypertension: Secondary | ICD-10-CM | POA: Insufficient documentation

## 2014-12-23 DIAGNOSIS — Z7951 Long term (current) use of inhaled steroids: Secondary | ICD-10-CM | POA: Insufficient documentation

## 2014-12-23 DIAGNOSIS — E119 Type 2 diabetes mellitus without complications: Secondary | ICD-10-CM | POA: Insufficient documentation

## 2014-12-23 DIAGNOSIS — W298XXA Contact with other powered powered hand tools and household machinery, initial encounter: Secondary | ICD-10-CM | POA: Insufficient documentation

## 2014-12-23 DIAGNOSIS — Y9389 Activity, other specified: Secondary | ICD-10-CM | POA: Insufficient documentation

## 2014-12-23 DIAGNOSIS — S61313A Laceration without foreign body of left middle finger with damage to nail, initial encounter: Secondary | ICD-10-CM | POA: Insufficient documentation

## 2014-12-23 DIAGNOSIS — S62639B Displaced fracture of distal phalanx of unspecified finger, initial encounter for open fracture: Secondary | ICD-10-CM

## 2014-12-23 DIAGNOSIS — Z79899 Other long term (current) drug therapy: Secondary | ICD-10-CM | POA: Insufficient documentation

## 2014-12-23 DIAGNOSIS — K219 Gastro-esophageal reflux disease without esophagitis: Secondary | ICD-10-CM | POA: Insufficient documentation

## 2014-12-23 HISTORY — PX: I&D EXTREMITY: SHX5045

## 2014-12-23 LAB — POCT I-STAT, CHEM 8
BUN: 18 mg/dL (ref 6–23)
CALCIUM ION: 0.98 mmol/L — AB (ref 1.13–1.30)
Chloride: 106 mmol/L (ref 96–112)
Creatinine, Ser: 0.7 mg/dL (ref 0.50–1.35)
Glucose, Bld: 121 mg/dL — ABNORMAL HIGH (ref 70–99)
HCT: 43 % (ref 39.0–52.0)
HEMOGLOBIN: 14.6 g/dL (ref 13.0–17.0)
Potassium: 4.3 mmol/L (ref 3.5–5.1)
Sodium: 137 mmol/L (ref 135–145)
TCO2: 21 mmol/L (ref 0–100)

## 2014-12-23 LAB — GLUCOSE, CAPILLARY: GLUCOSE-CAPILLARY: 110 mg/dL — AB (ref 70–99)

## 2014-12-23 SURGERY — IRRIGATION AND DEBRIDEMENT EXTREMITY
Anesthesia: General | Site: Finger | Laterality: Left

## 2014-12-23 MED ORDER — FENTANYL CITRATE (PF) 250 MCG/5ML IJ SOLN
INTRAMUSCULAR | Status: AC
  Filled 2014-12-23: qty 5

## 2014-12-23 MED ORDER — TETANUS-DIPHTH-ACELL PERTUSSIS 5-2.5-18.5 LF-MCG/0.5 IM SUSP
0.5000 mL | Freq: Once | INTRAMUSCULAR | Status: AC
  Administered 2014-12-23: 0.5 mL via INTRAMUSCULAR
  Filled 2014-12-23: qty 0.5

## 2014-12-23 MED ORDER — BUPIVACAINE HCL (PF) 0.25 % IJ SOLN
INTRAMUSCULAR | Status: DC | PRN
  Administered 2014-12-23: 30 mL

## 2014-12-23 MED ORDER — PHENYLEPHRINE HCL 10 MG/ML IJ SOLN
INTRAMUSCULAR | Status: DC | PRN
  Administered 2014-12-23: 40 ug via INTRAVENOUS
  Administered 2014-12-23: 80 ug via INTRAVENOUS
  Administered 2014-12-23: 40 ug via INTRAVENOUS
  Administered 2014-12-23: 80 ug via INTRAVENOUS

## 2014-12-23 MED ORDER — DEXAMETHASONE SODIUM PHOSPHATE 4 MG/ML IJ SOLN
INTRAMUSCULAR | Status: DC | PRN
  Administered 2014-12-23: 4 mg via INTRAVENOUS

## 2014-12-23 MED ORDER — LIDOCAINE HCL (CARDIAC) 20 MG/ML IV SOLN
INTRAVENOUS | Status: AC
Start: 2014-12-23 — End: 2014-12-23
  Filled 2014-12-23: qty 5

## 2014-12-23 MED ORDER — CEFAZOLIN SODIUM-DEXTROSE 2-3 GM-% IV SOLR
2.0000 g | Freq: Once | INTRAVENOUS | Status: AC
  Administered 2014-12-23: 2 g via INTRAVENOUS
  Filled 2014-12-23: qty 50

## 2014-12-23 MED ORDER — MINERAL OIL LIGHT 100 % EX OIL
TOPICAL_OIL | CUTANEOUS | Status: DC | PRN
  Administered 2014-12-23: 1 via TOPICAL

## 2014-12-23 MED ORDER — OXYCODONE HCL 5 MG/5ML PO SOLN: 5.0000 mg | Freq: Once | ORAL | Status: AC | PRN

## 2014-12-23 MED ORDER — MEPERIDINE HCL 25 MG/ML IJ SOLN: 6.2500 mg | INTRAMUSCULAR | Status: DC | PRN

## 2014-12-23 MED ORDER — ONDANSETRON HCL 4 MG/2ML IJ SOLN
INTRAMUSCULAR | Status: DC | PRN
  Administered 2014-12-23: 4 mg via INTRAVENOUS

## 2014-12-23 MED ORDER — MORPHINE SULFATE 2 MG/ML IJ SOLN
1.0000 mg | INTRAMUSCULAR | Status: DC | PRN
  Administered 2014-12-23: 2 mg via INTRAVENOUS
  Filled 2014-12-23: qty 1

## 2014-12-23 MED ORDER — MINERAL OIL LIGHT 100 % EX OIL
TOPICAL_OIL | CUTANEOUS | Status: AC
Start: 2014-12-23 — End: 2014-12-23
  Filled 2014-12-23: qty 25

## 2014-12-23 MED ORDER — SODIUM CHLORIDE 0.9 % IV SOLN
INTRAVENOUS | Status: DC | PRN
  Administered 2014-12-23 (×2): via INTRAVENOUS

## 2014-12-23 MED ORDER — ONDANSETRON HCL 4 MG/2ML IJ SOLN
INTRAMUSCULAR | Status: AC
  Filled 2014-12-23: qty 2

## 2014-12-23 MED ORDER — OXYCODONE HCL 5 MG PO TABS: 10.0000 mg | ORAL_TABLET | ORAL | Status: DC | PRN

## 2014-12-23 MED ORDER — LIDOCAINE HCL (CARDIAC) 20 MG/ML IV SOLN
INTRAVENOUS | Status: DC | PRN
  Administered 2014-12-23: 80 mg via INTRAVENOUS

## 2014-12-23 MED ORDER — BUPIVACAINE HCL (PF) 0.25 % IJ SOLN
INTRAMUSCULAR | Status: AC
  Filled 2014-12-23: qty 30

## 2014-12-23 MED ORDER — KETOROLAC TROMETHAMINE 30 MG/ML IJ SOLN
INTRAMUSCULAR | Status: AC
  Filled 2014-12-23: qty 1

## 2014-12-23 MED ORDER — PROPOFOL 10 MG/ML IV BOLUS
INTRAVENOUS | Status: AC
  Filled 2014-12-23: qty 20

## 2014-12-23 MED ORDER — ONDANSETRON HCL 4 MG/2ML IJ SOLN
4.0000 mg | Freq: Once | INTRAMUSCULAR | Status: AC
  Administered 2014-12-23: 4 mg via INTRAMUSCULAR

## 2014-12-23 MED ORDER — HYDROMORPHONE HCL 1 MG/ML IJ SOLN
INTRAMUSCULAR | Status: AC
  Administered 2014-12-23: 0.5 mg via INTRAVENOUS
  Filled 2014-12-23: qty 1

## 2014-12-23 MED ORDER — FENTANYL CITRATE (PF) 100 MCG/2ML IJ SOLN
INTRAMUSCULAR | Status: DC | PRN
  Administered 2014-12-23: 50 ug via INTRAVENOUS
  Administered 2014-12-23: 25 ug via INTRAVENOUS
  Administered 2014-12-23: 100 ug via INTRAVENOUS

## 2014-12-23 MED ORDER — PROPOFOL 10 MG/ML IV BOLUS
INTRAVENOUS | Status: DC | PRN
  Administered 2014-12-23: 200 mg via INTRAVENOUS

## 2014-12-23 MED ORDER — CEPHALEXIN 500 MG PO CAPS: 500.0000 mg | ORAL_CAPSULE | Freq: Four times a day (QID) | ORAL | Status: DC

## 2014-12-23 MED ORDER — LIDOCAINE HCL 2 % IJ SOLN
20.0000 mL | Freq: Once | INTRAMUSCULAR | Status: DC
  Filled 2014-12-23: qty 20

## 2014-12-23 MED ORDER — KETOROLAC TROMETHAMINE 30 MG/ML IJ SOLN
30.0000 mg | Freq: Once | INTRAMUSCULAR | Status: AC | PRN
  Administered 2014-12-23: 30 mg via INTRAVENOUS

## 2014-12-23 MED ORDER — ONDANSETRON HCL 4 MG/2ML IJ SOLN
4.0000 mg | Freq: Once | INTRAMUSCULAR | Status: AC
  Administered 2014-12-23: 4 mg via INTRAVENOUS
  Filled 2014-12-23: qty 2

## 2014-12-23 MED ORDER — HYDROMORPHONE HCL 1 MG/ML IJ SOLN
INTRAMUSCULAR | Status: AC
Start: 2014-12-23 — End: 2014-12-23
  Filled 2014-12-23: qty 2

## 2014-12-23 MED ORDER — OXYCODONE HCL 5 MG PO TABS
ORAL_TABLET | ORAL | Status: AC
  Administered 2014-12-23: 5 mg via ORAL
  Filled 2014-12-23: qty 1

## 2014-12-23 MED ORDER — OXYCODONE HCL 5 MG PO TABS
5.0000 mg | ORAL_TABLET | Freq: Once | ORAL | Status: AC | PRN
Start: 2014-12-23 — End: 2014-12-23
  Administered 2014-12-23: 5 mg via ORAL

## 2014-12-23 MED ORDER — SODIUM CHLORIDE 0.9 % IR SOLN
Status: DC | PRN
  Administered 2014-12-23: 3000 mL

## 2014-12-23 MED ORDER — HYDROMORPHONE HCL 1 MG/ML IJ SOLN
0.2500 mg | INTRAMUSCULAR | Status: DC | PRN
  Administered 2014-12-23 (×2): 0.5 mg via INTRAVENOUS

## 2014-12-23 MED ORDER — IBUPROFEN 200 MG PO TABS
200.0000 mg | ORAL_TABLET | Freq: Four times a day (QID) | ORAL | Status: DC | PRN
  Filled 2014-12-23: qty 2

## 2014-12-23 MED ORDER — IBUPROFEN 100 MG/5ML PO SUSP
200.0000 mg | Freq: Four times a day (QID) | ORAL | Status: DC | PRN
  Filled 2014-12-23: qty 20

## 2014-12-23 MED ORDER — HYDROMORPHONE HCL 1 MG/ML IJ SOLN
2.0000 mg | Freq: Once | INTRAMUSCULAR | Status: AC
  Administered 2014-12-23: 2 mg via INTRAMUSCULAR

## 2014-12-23 SURGICAL SUPPLY — 46 items
BALL CTTN LRG ABS STRL LF (GAUZE/BANDAGES/DRESSINGS) ×1
BANDAGE ELASTIC 3 VELCRO ST LF (GAUZE/BANDAGES/DRESSINGS) ×3 IMPLANT
BANDAGE ELASTIC 4 VELCRO ST LF (GAUZE/BANDAGES/DRESSINGS) ×3 IMPLANT
BNDG CONFORM 2 STRL LF (GAUZE/BANDAGES/DRESSINGS) ×3 IMPLANT
BNDG GAUZE ELAST 4 BULKY (GAUZE/BANDAGES/DRESSINGS) ×3 IMPLANT
CORDS BIPOLAR (ELECTRODE) ×3 IMPLANT
COTTONBALL LRG STERILE PKG (GAUZE/BANDAGES/DRESSINGS) ×3 IMPLANT
CUFF TOURNIQUET SINGLE 18IN (TOURNIQUET CUFF) ×3 IMPLANT
CUFF TOURNIQUET SINGLE 24IN (TOURNIQUET CUFF) IMPLANT
DRSG ADAPTIC 3X8 NADH LF (GAUZE/BANDAGES/DRESSINGS) ×3 IMPLANT
ELECT REM PT RETURN 9FT ADLT (ELECTROSURGICAL)
ELECTRODE REM PT RTRN 9FT ADLT (ELECTROSURGICAL) IMPLANT
GAUZE SPONGE 4X4 12PLY STRL (GAUZE/BANDAGES/DRESSINGS) ×3 IMPLANT
GAUZE XEROFORM 1X8 LF (GAUZE/BANDAGES/DRESSINGS) IMPLANT
GAUZE XEROFORM 5X9 LF (GAUZE/BANDAGES/DRESSINGS) ×3 IMPLANT
GLOVE BIOGEL M STRL SZ7.5 (GLOVE) ×3 IMPLANT
GLOVE SS BIOGEL STRL SZ 8 (GLOVE) ×1 IMPLANT
GLOVE SUPERSENSE BIOGEL SZ 8 (GLOVE) ×2
GOWN STRL REUS W/ TWL LRG LVL3 (GOWN DISPOSABLE) ×2 IMPLANT
GOWN STRL REUS W/ TWL XL LVL3 (GOWN DISPOSABLE) ×1 IMPLANT
GOWN STRL REUS W/TWL LRG LVL3 (GOWN DISPOSABLE) ×6
GOWN STRL REUS W/TWL XL LVL3 (GOWN DISPOSABLE) ×3
HANDPIECE INTERPULSE COAX TIP (DISPOSABLE)
KIT BASIN OR (CUSTOM PROCEDURE TRAY) ×3 IMPLANT
KIT ROOM TURNOVER OR (KITS) ×3 IMPLANT
MANIFOLD NEPTUNE II (INSTRUMENTS) ×3 IMPLANT
NEEDLE HYPO 25GX1X1/2 BEV (NEEDLE) ×3 IMPLANT
NS IRRIG 1000ML POUR BTL (IV SOLUTION) ×3 IMPLANT
PACK ORTHO EXTREMITY (CUSTOM PROCEDURE TRAY) ×3 IMPLANT
PAD ARMBOARD 7.5X6 YLW CONV (MISCELLANEOUS) ×6 IMPLANT
PAD CAST 4YDX4 CTTN HI CHSV (CAST SUPPLIES) ×1 IMPLANT
PADDING CAST COTTON 4X4 STRL (CAST SUPPLIES) ×3
SET HNDPC FAN SPRY TIP SCT (DISPOSABLE) IMPLANT
SPLINT FIBERGLASS 4X30 (CAST SUPPLIES) ×3 IMPLANT
SPONGE LAP 18X18 X RAY DECT (DISPOSABLE) IMPLANT
SPONGE LAP 4X18 X RAY DECT (DISPOSABLE) IMPLANT
SUT CHROMIC 5 0 P 3 (SUTURE) ×6 IMPLANT
SUT PROLENE 4 0 P 3 18 (SUTURE) ×24 IMPLANT
SYR CONTROL 10ML LL (SYRINGE) ×3 IMPLANT
TOWEL OR 17X24 6PK STRL BLUE (TOWEL DISPOSABLE) ×3 IMPLANT
TOWEL OR 17X26 10 PK STRL BLUE (TOWEL DISPOSABLE) ×3 IMPLANT
TUBE ANAEROBIC SPECIMEN COL (MISCELLANEOUS) IMPLANT
TUBE CONNECTING 12'X1/4 (SUCTIONS) ×1
TUBE CONNECTING 12X1/4 (SUCTIONS) ×2 IMPLANT
WATER STERILE IRR 1000ML POUR (IV SOLUTION) ×3 IMPLANT
YANKAUER SUCT BULB TIP NO VENT (SUCTIONS) ×3 IMPLANT

## 2014-12-23 NOTE — Anesthesia Preprocedure Evaluation (Addendum)
Anesthesia Evaluation  Patient identified by MRN, date of birth, ID band Patient awake    Reviewed: Allergy & Precautions, NPO status , Patient's Chart, lab work & pertinent test results  Airway Mallampati: I  TM Distance: >3 FB Neck ROM: Full    Dental  (+) Teeth Intact, Dental Advisory Given   Pulmonary  breath sounds clear to auscultation        Cardiovascular hypertension, Pt. on medications Rhythm:Regular Rate:Normal     Neuro/Psych    GI/Hepatic GERD-  Medicated and Controlled,  Endo/Other  diabetes, Well Controlled, Type 2, Oral Hypoglycemic Agents  Renal/GU      Musculoskeletal   Abdominal   Peds  Hematology   Anesthesia Other Findings   Reproductive/Obstetrics                            Anesthesia Physical Anesthesia Plan  ASA: III  Anesthesia Plan: General   Post-op Pain Management:    Induction: Intravenous  Airway Management Planned: LMA  Additional Equipment:   Intra-op Plan:   Post-operative Plan: Extubation in OR  Informed Consent: I have reviewed the patients History and Physical, chart, labs and discussed the procedure including the risks, benefits and alternatives for the proposed anesthesia with the patient or authorized representative who has indicated his/her understanding and acceptance.   Dental advisory given  Plan Discussed with: CRNA, Anesthesiologist and Surgeon  Anesthesia Plan Comments:         Anesthesia Quick Evaluation

## 2014-12-23 NOTE — Anesthesia Procedure Notes (Signed)
Procedure Name: LMA Insertion Date/Time: 12/23/2014 4:34 PM Performed by: Raphael Gibney T Pre-anesthesia Checklist: Patient identified, Timeout performed, Emergency Drugs available, Suction available and Patient being monitored Patient Re-evaluated:Patient Re-evaluated prior to inductionOxygen Delivery Method: Circle system utilized and Simple face mask Preoxygenation: Pre-oxygenation with 100% oxygen Intubation Type: IV induction Ventilation: Mask ventilation without difficulty LMA Size: 5.0 Number of attempts: 1 Airway Equipment and Method: Patient positioned with wedge pillow Placement Confirmation: positive ETCO2 and breath sounds checked- equal and bilateral Tube secured with: Tape Dental Injury: Teeth and Oropharynx as per pre-operative assessment
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
## 2014-12-23 NOTE — ED Notes (Signed)
Remains  Npo

## 2014-12-23 NOTE — ED Notes (Addendum)
Dr Amedeo Plenty at bedside-- speaking with son on phone on facetime, explaining care of amputation.

## 2014-12-23 NOTE — ED Notes (Addendum)
Clothing and belongings with wife. Report to [facility] in [place] stay

## 2014-12-23 NOTE — Discharge Instructions (Signed)
Please try not to move your index and middle finger  Please keep your bandage clean and dry  Please call for any problems otherwise we will see her in 14 days  Keep bandage clean and dry.  Call for any problems.  No smoking.  Criteria for driving a car: you should be off your pain medicine for 7-8 hours, able to drive one handed(confident), thinking clearly and feeling able in your judgement to drive. Continue elevation as it will decrease swelling.  If instructed by MD move your fingers within the confines of the bandage/splint.  Use ice if instructed by your MD. Call immediately for any sudden loss of feeling in your hand/arm or change in functional abilities of the extremity.We recommend that you to take vitamin C 1000 mg a day to promote healing. We also recommend that if you require  pain medicine that you take a stool softener to prevent constipation as most pain medicines will have constipation side effects. We recommend either Peri-Colace or Senokot and recommend that you also consider adding MiraLAX to prevent the constipation affects from pain medicine if you are required to use them. These medicines are over the counter and maybe purchased at a local pharmacy. A cup of yogurt and a probiotic can also be helpful during the recovery process as the medicines can disrupt your intestinal environment.

## 2014-12-23 NOTE — ED Notes (Signed)
Pt advised   Npo

## 2014-12-23 NOTE — Transfer of Care (Signed)
Immediate Anesthesia Transfer of Care Note  Patient: Michael Campos  Procedure(s) Performed: Procedure(s): IRRIGATION AND DEBRIDEMENT OF HAND CROSS FINGER FLAP AND REPAIR (Left)  Patient Location: PACU  Anesthesia Type:General  Level of Consciousness: awake and alert   Airway & Oxygen Therapy: Patient Spontanous Breathing and Patient connected to nasal cannula oxygen  Post-op Assessment: Report given to RN, Post -op Vital signs reviewed and stable and Patient moving all extremities X 4  Post vital signs: Reviewed and stable  Last Vitals:  Filed Vitals:   12/23/14 1805  BP: 140/80  Pulse: 69  Temp: 36.4 C  Resp: 11    Complications: No apparent anesthesia complications

## 2014-12-23 NOTE — ED Notes (Signed)
Pt  Sustained  A  Laceration  To     l   Pointer  And  Middle finger  Today  On a  Table  Saw       Partial   Amputation

## 2014-12-23 NOTE — ED Notes (Signed)
Pt has amputation of left index finger distal to DIP joint from using router at work
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
## 2014-12-23 NOTE — Op Note (Signed)
See dictation #[id number] Amedeo Plenty MD

## 2014-12-23 NOTE — ED Notes (Addendum)
Pt sent from [facility] for evaluation fo partial amputation to L pointer and middle finger with table saw this morning. Dressing in place from [facility] with no active bleeding now. He was given dilaudid and zofran infections at [facility] with some relief of pain.
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
## 2014-12-23 NOTE — Anesthesia Postprocedure Evaluation (Signed)
  Anesthesia Post-op Note  Patient: Michael Campos  Procedure(s) Performed: Procedure(s): IRRIGATION AND DEBRIDEMENT OF HAND CROSS FINGER FLAP AND REPAIR (Left)  Patient Location: PACU  Anesthesia Type: General   Level of Consciousness: awake, alert  and oriented  Airway and Oxygen Therapy: Patient Spontanous Breathing  Post-op Pain: mild  Post-op Assessment: Post-op Vital signs reviewed  Post-op Vital Signs: Reviewed  Last Vitals:  Filed Vitals:   12/23/14 1953  BP: 120/73  Pulse: 71  Temp: 36.6 C  Resp: 20    Complications: No apparent anesthesia complications

## 2014-12-23 NOTE — ED Notes (Signed)
Per instructions applied non adherent telfa and coban to amputed finger of left hand for transport to ed

## 2014-12-25 ENCOUNTER — Encounter (HOSPITAL_COMMUNITY): Payer: Self-pay | Admitting: Orthopedic Surgery

## 2014-12-25 NOTE — Op Note (Signed)
Michael Campos, Michael Campos     ACCOUNT NO.:  [id number]  MEDICAL RECORD NO.:  [id number]  LOCATION:                                FACILITY:  [facility]  PHYSICIAN:  Satira Anis. Eleonora Peeler, M.D.DATE OF BIRTH:  [birth year]  DATE OF PROCEDURE:  12/25/2014 DATE OF DISCHARGE:  12/23/2014                              OPERATIVE REPORT   PREOPERATIVE DIAGNOSES: 1. Left index finger amputation, oblique in nature with exposed bone     and nail injury. 2. Nail bed injury, left middle finger with a deep laceration.  POSTOPERATIVE DIAGNOSES: 1. Left index finger amputation, oblique in nature with exposed bone     and nail injury. 2. Nail bed injury, left middle finger with a deep laceration.  PROCEDURE: 1. Irrigation and debridement of skin, subcutaneous tissue, and bone;     excisional nature, left index finger. 2. Irrigation and debridement, excisional nature, skin, subcutaneous     tissue, and nail bed left middle finger. 3. Partial nail plate removal, left middle finger. 4. Complete nail plate removal, left index finger. 5. Nail bed repair, left middle finger. 6. Cross-finger flap to the index finger utilizing the middle finger's     donor site dorsally. 7. Full-thickness skin graft to the middle finger secondary defect     after cross-finger flap.  SURGEON:  Satira Anis. Amedeo Plenty, MD  ASSISTANT:  None.  COMPLICATION:  None.  ANESTHESIA:  General.  TOURNIQUET TIME:  Less than an hour.  INDICATIONS:  The patient is a 64 year old male, who presents with the above-mentioned diagnosis.  I have counseled him in regard to risks and benefits of surgery and he desires to proceed with the above-mentioned operative intervention.  All questions have been encouraged and answered preoperatively.  OPERATIVE PROCEDURE:  The patient was seen by myself and Anesthesia, taken down to operative suite, and underwent smooth induction of general anesthesia.  Preoperatively, we had an extensive conversation  about options for treatment, do's and don'ts, etc.  He desired to proceed with the above-mentioned surgical attempt, a cross-finger flap to try to save length of his index finger and salvage it.  He had exposed bone, significant injury of course.  This was an oblique laceration amputation with exposed bone and nail bed and nail plate injury.  The operation commenced with sterile prep and drape.  He had a pre-scrub by myself followed by 10 minutes surgical Betadine scrub and paint. Sterile field was secured.  Time-out called.  Pre and postop check was complete.  The patient then underwent a very careful and cautious I and D of skin, subcutaneous tissue, bone, about the index finger.  Following this, I and D of skin, subcutaneous tissue, about the middle finger was accomplished.  During this time, we performed nail plate removal, partial in nature about the middle finger and complete nail plate removal about the remnant of the index finger.  This was done with Desiree Hane.  Following this, 3 L were placed through and through the wounds.  Curette scissor tip and knife blade were used for the excisional debridement.  Following this, I then performed nail bed repair under 4.0 Loupe magnification about the left middle finger.  The patient tolerated this well with fine chromic  suture.  Following this, landmarks were made on a cross-finger flap based on P2 region and swung to the most radial aspect, P2 was accomplished.  I took very careful attention to make sure with fine facial nerve dissector that I elevated the flap off the tendon keeping the paratenon and its vascular sheath preserved for skin grafting later.  Flap was elevated nicely, looked well with an inset into the defect in the index finger and all looked quite well.  I shaked this nicely and placed a combination of chromic and Prolene sutures over this area.  Following this, we then performed harvesting of a  full-thickness skin graft from the volar forearm.  Ellipse was made.  Area was harvested, closed primarily with far-near, near-fars and then defatted and small pie crust placed in it.  It was then placed in inset in the middle finger dorsally.  Following this, the patient had Adaptic Xeroforms, then mineral-soaked cotton balls placed dorsally and the bolster dressings were tied down nicely for a slight bit of compression on the skin graft.  The flap volarly pinked up nicely and there were no complicating features.  We were quite pleased with these findings.  Once this was complete, the patient then underwent a very careful and cautious approach to the dressing.  I was pleased with the refill, the cross-finger flap and all issues and aspects looked quite well.  We placed Adaptic Xeroform and a splint in place and the 2 fingers were then very gently hugged into place without difficulty.  There were no complicating features.  The patient tolerated this well.  He was awoken from anesthesia, was taken to recovery room.  All sponge, needle and instruments counts were reported as correct.  The patient will be monitored in recovery room.  I will discharge him home on Keflex 500 q.i.d. x10 days as well as OxyIR p.r.n. pain.  Should any problems occur, we will be immediately available.  These notes have been discussed, and all questions have been encouraged and answered.  It is pleasure see to see him today, see him back in the office in 2 weeks and at that time, PEGs dressing down and scheduling for takedown of the cross-finger flap, if the flap has maintained its viability, etc.  FINAL DISCHARGE DIAGNOSES:  Amputation, index finger and nail bed injury, middle finger left hand with subsequent I and D, cross-finger flap to the index finger and repair of the nail beds.  I should note the nail bed of the index finger was repaired with the flap architecture.  These notes were  discussed.    Satira Anis. Amedeo Plenty, M.D.    [facility]  D:  12/23/2014  T:  12/23/2014  Job:  [id number]

## 2015-01-08 ENCOUNTER — Other Ambulatory Visit: Payer: Self-pay | Admitting: Orthopedic Surgery

## 2015-01-09 ENCOUNTER — Ambulatory Visit: Payer: No Typology Code available for payment source | Attending: Internal Medicine | Admitting: Internal Medicine

## 2015-01-09 ENCOUNTER — Encounter: Payer: Self-pay | Admitting: Internal Medicine

## 2015-01-09 VITALS — BP 119/79 | HR 72 | Temp 97.8°F | Resp 16 | Ht 66.0 in | Wt 173.0 lb

## 2015-01-09 DIAGNOSIS — I10 Essential (primary) hypertension: Secondary | ICD-10-CM | POA: Insufficient documentation

## 2015-01-09 DIAGNOSIS — Z791 Long term (current) use of non-steroidal anti-inflammatories (NSAID): Secondary | ICD-10-CM | POA: Insufficient documentation

## 2015-01-09 DIAGNOSIS — L84 Corns and callosities: Secondary | ICD-10-CM | POA: Insufficient documentation

## 2015-01-09 DIAGNOSIS — E785 Hyperlipidemia, unspecified: Secondary | ICD-10-CM | POA: Insufficient documentation

## 2015-01-09 DIAGNOSIS — K219 Gastro-esophageal reflux disease without esophagitis: Secondary | ICD-10-CM | POA: Insufficient documentation

## 2015-01-09 DIAGNOSIS — E1140 Type 2 diabetes mellitus with diabetic neuropathy, unspecified: Secondary | ICD-10-CM | POA: Insufficient documentation

## 2015-01-09 DIAGNOSIS — D225 Melanocytic nevi of trunk: Secondary | ICD-10-CM | POA: Insufficient documentation

## 2015-01-09 DIAGNOSIS — E119 Type 2 diabetes mellitus without complications: Secondary | ICD-10-CM

## 2015-01-09 DIAGNOSIS — Z79899 Other long term (current) drug therapy: Secondary | ICD-10-CM | POA: Insufficient documentation

## 2015-01-09 LAB — POCT GLYCOSYLATED HEMOGLOBIN (HGB A1C): Hemoglobin A1C: 6.5

## 2015-01-09 LAB — GLUCOSE, POCT (MANUAL RESULT ENTRY): POC Glucose: 147 mg/dl — AB (ref 70–99)

## 2015-01-09 MED ORDER — LISINOPRIL 5 MG PO TABS: 5.0000 mg | ORAL_TABLET | Freq: Every day | ORAL | Status: DC

## 2015-01-09 MED ORDER — GLIMEPIRIDE 2 MG PO TABS: 2.0000 mg | ORAL_TABLET | Freq: Every day | ORAL | Status: DC

## 2015-01-09 MED ORDER — OMEPRAZOLE 40 MG PO CPDR: 40.0000 mg | DELAYED_RELEASE_CAPSULE | Freq: Every day | ORAL | Status: DC

## 2015-01-09 MED ORDER — ROSUVASTATIN CALCIUM 10 MG PO TABS: 10.0000 mg | ORAL_TABLET | Freq: Every day | ORAL | Status: DC

## 2015-01-09 NOTE — Progress Notes (Signed)
Patient ID: Michael Campos, male   DOB: [birth year], 64 y.o.   MRN: [id number] 1. HTN: Medication: lisinopril Home BP monitoring: Does not check at home  Positive ROS none Negative ROS: headaches, chest pain, edema,   2. DM2:  Medication: glimepiride Home CBG monitoring: checks spordically Hypoglycemic event: none  Positive ROS neuropathy  Negative WIO:XBDZHGD vision, polyuria/dipsia  3. HLD: Medication: Crestor Tolerance: well  Positive ROS none Negative JME:[id number]  4. Mole on middle of back that wife noticed has grown in size and bleeds easily when touched.   Social History reviewed: Smoker never Exercise occasionally   Physical Exam  Constitutional: He is oriented to person, place, and time.  Cardiovascular: Normal rate, regular rhythm and normal heart sounds.   Pulses:      Dorsalis pedis pulses are 2+ on the right side, and 2+ on the left side.       Posterior tibial pulses are 2+ on the right side, and 2+ on the left side.  Pulmonary/Chest: Effort normal and breath sounds normal.  Feet:  Right Foot:  Skin Integrity: Positive for erythema and callus (first 2 toes). Negative for skin breakdown or warmth.  Left Foot:  Skin Integrity: Negative for skin breakdown.  Neurological: He is alert and oriented to person, place, and time.  Skin: Skin is warm and dry.   Tamer was seen today for follow-up.  Diagnoses and all orders for this visit:  Type 2 diabetes mellitus without complication Orders: -     Glucose (CBG) -     HgB A1c -     Refill glimepiride (AMARYL) 2 MG tablet; Take 1 tablet (2 mg total) by mouth daily before breakfast. Patients diabetes is well control as evidence by consistently low a1c.  Patient will continue with current therapy and continue to make necessary lifestyle changes.  Reviewed foot care, diet, exercise, annual health maintenance with patient.   Melanocytic nevi of trunk Orders: -     Ambulatory referral to Dermatology Mole  appears that it can be cancerous, will need to see derm asap  Essential hypertension Orders: -     lisinopril (PRINIVIL,ZESTRIL) 5 MG tablet; Take 1 tablet (5 mg total) by mouth daily.  Patient blood pressure is stable and may continue on current medication.  Education on diet, exercise, and modifiable risk factors discussed. Will obtain appropriate labs as needed. Will follow up in 3-6 months.   Dyslipidemia Orders: -     rosuvastatin (CRESTOR) 10 MG tablet; Take 1 tablet (10 mg total) by mouth daily. Education provided on proper lifestyle changes in order to lower cholesterol. Patient advised to maintain healthy weight and to keep total fat intake at 25-35% of total calories and carbohydrates 50-60% of total daily calories. Explained how high cholesterol places patient at risk for heart disease. Patient placed on appropriate medication and repeat labs in 6 months   Gastroesophageal reflux disease, esophagitis presence not specified Orders: -     Refill omeprazole (PRILOSEC) 40 MG capsule; Take 1 capsule (40 mg total) by mouth daily.  Right foot callus  Referral to podiatry placed, advised patient to wear proper fitted shoes and thick socks.   Due to language barrier, an interpreter was present during the history-taking and subsequent discussion (and for part of the physical exam) with this patient.  Return in about 6 months (around 07/12/2015) for DM/HTN.  Chari Manning, NP 01/09/2015 3:31 PM

## 2015-01-09 NOTE — Progress Notes (Signed)
Pt is here following up on his diabetes, HTN and his hyperlipidemia. Interpreter [place].

## 2015-01-09 NOTE — Patient Instructions (Signed)
Lunares  (Moles) Los lunares son lesiones de la piel que no son peligrosos. Son Ardelia Mems acumulacin de clulas de color (pigmentadas) en la piel que:   Pueden tener varios colores, desde color marrn claro a negro.  Pueden aparecer en cualquier parte del cuerpo.  Pueden ser planos o abultados.  Pueden tener pelos.  Pueden ser lisos o arrugados. La mayora de los lunares no son cancerosos (benignos). Sin embargo. algunos pueden mostrar cambios y [employer]. Es importante controlarlos mensualmente. Si los controla con regularidad, podr [employer] cambios que puedan ocurrir.  CAUSAS  Los lunares se producen cuando las clulas de la piel crecen en grupos en lugar de extenderse en la piel como normalmente lo hacen. El motivo de este agrupamiento no se conoce.  DIAGNSTICO  El [employer] har un examen de la piel para diagnosticar el lunar.  TRATAMIENTO  En general los lunares no necesitan tratamiento. Si se vuelven una preocupacin, el mdico podr decidir si toma Truddie Coco del lunar o lo extirpa completamente y lo enva al laboratorio para que lo examinen.  INSTRUCCIONES PARA EL CUIDADO EN EL HOGAR   Controle sus lunares todos los meses para ver si hay cambios que puedan indicar que se trata de cncer. Estos cambios son:  Programme researcher, broadcasting/film/video.  Cambio en el color. Note que los lunares tienden a oscurecerse durante el embarazo o al tomar pldoras anticonceptivas (anticonceptivos orales).  Cambios en la forma.  Cambio en los bordes.  Use protector solar (con un SPF de al menos 30), cuando pase mucho tiempo afuera. Vuelva a Estate agent cada 2 a 3 horas.  Programe controles anuales con su mdico especialista en piel (dermatlogo) si tiene muchos lunares. SOLICITE ATENCIN MDICA SI:   El lunar cambia de tamao, especialmente si se hace ms grande que una goma de Games developer.  Cambia de color o tiene ms de un color.   Le pica o sangra.  El lunar o la piel que lo  rodea le duelen, estn rojos o hinchados.  Se vuelve escamoso, la piel se cae o supura lquido.   Presenta bordes irregulares.  Se vuelve plano o tiene reas elevadas.  Se endurece o se ablanda. Document Released: 05/28/2005 Document Revised: 05/12/2012 [facility] Patient Information 2015 [place]. This information is not intended to replace advice given to you by your health care provider. Make sure you discuss any questions you have with your health care provider.

## 2015-01-10 ENCOUNTER — Encounter (HOSPITAL_COMMUNITY): Payer: Self-pay | Admitting: *Deleted

## 2015-01-10 MED ORDER — CEFAZOLIN SODIUM-DEXTROSE 2-3 GM-% IV SOLR
2.0000 g | INTRAVENOUS | Status: AC
  Administered 2015-01-11: 2 g via INTRAVENOUS
  Filled 2015-01-10: qty 50

## 2015-01-10 MED ORDER — SODIUM CHLORIDE 0.45 % IV SOLN: INTRAVENOUS | Status: DC

## 2015-01-10 NOTE — Progress Notes (Signed)
Pt denies SOB, chest pain, and being under the care of a cardiologist. Pt denies having a stress test, echo and cardiac cath. Pt made aware to not take any diabetic medications the morning of procedure. Pt made aware to stop ASA, NSAID's otc,vitamins and herbal medications. Pt suggested that I leave the telephone number to call at 8:00 AM for arrival time on home phone. Interpreter Elita Quick # [phone] did just that. Pt verbalized understanding of pre-op instructions.

## 2015-01-11 ENCOUNTER — Other Ambulatory Visit: Payer: Self-pay

## 2015-01-11 ENCOUNTER — Ambulatory Visit (HOSPITAL_COMMUNITY)
Admission: RE | Admit: 2015-01-11 | Discharge: 2015-01-11 | Disposition: A | Discharge status: Home / Self Care | Payer: No Typology Code available for payment source | Source: Ambulatory Visit | Attending: Orthopedic Surgery | Admitting: Orthopedic Surgery

## 2015-01-11 ENCOUNTER — Encounter (HOSPITAL_COMMUNITY)
Admission: RE | Disposition: A | Discharge status: Home / Self Care | Payer: Self-pay | Source: Ambulatory Visit | Attending: Orthopedic Surgery

## 2015-01-11 ENCOUNTER — Ambulatory Visit (HOSPITAL_COMMUNITY): Payer: No Typology Code available for payment source | Admitting: Anesthesiology

## 2015-01-11 ENCOUNTER — Ambulatory Visit (HOSPITAL_COMMUNITY): Payer: Self-pay | Admitting: Anesthesiology

## 2015-01-11 ENCOUNTER — Encounter (HOSPITAL_COMMUNITY): Payer: Self-pay | Admitting: *Deleted

## 2015-01-11 DIAGNOSIS — E785 Hyperlipidemia, unspecified: Secondary | ICD-10-CM | POA: Insufficient documentation

## 2015-01-11 DIAGNOSIS — Z48817 Encounter for surgical aftercare following surgery on the skin and subcutaneous tissue: Secondary | ICD-10-CM | POA: Insufficient documentation

## 2015-01-11 DIAGNOSIS — E119 Type 2 diabetes mellitus without complications: Secondary | ICD-10-CM | POA: Insufficient documentation

## 2015-01-11 DIAGNOSIS — I10 Essential (primary) hypertension: Secondary | ICD-10-CM | POA: Insufficient documentation

## 2015-01-11 DIAGNOSIS — Z79899 Other long term (current) drug therapy: Secondary | ICD-10-CM | POA: Insufficient documentation

## 2015-01-11 HISTORY — DX: Other specified postprocedural states: Z98.890

## 2015-01-11 HISTORY — PX: INCISION AND DRAINAGE OF WOUND: SHX1803

## 2015-01-11 HISTORY — DX: Other specified postprocedural states: R11.2

## 2015-01-11 LAB — CBC
HCT: 42.6 % (ref 39.0–52.0)
HEMOGLOBIN: 14.3 g/dL (ref 13.0–17.0)
MCH: 30.2 pg (ref 26.0–34.0)
MCHC: 33.6 g/dL (ref 30.0–36.0)
MCV: 90.1 fL (ref 78.0–100.0)
PLATELETS: 381 10*3/uL (ref 150–400)
RBC: 4.73 MIL/uL (ref 4.22–5.81)
RDW: 12.1 % (ref 11.5–15.5)
WBC: 6.8 10*3/uL (ref 4.0–10.5)

## 2015-01-11 LAB — BASIC METABOLIC PANEL
Anion gap: 11 (ref 5–15)
BUN: 10 mg/dL (ref 6–20)
CALCIUM: 9.7 mg/dL (ref 8.9–10.3)
CO2: 25 mmol/L (ref 22–32)
Chloride: 105 mmol/L (ref 101–111)
Creatinine, Ser: 0.62 mg/dL (ref 0.61–1.24)
GFR calc Af Amer: >60 mL/min (ref >60)
GLUCOSE: 123 mg/dL — AB (ref 65–99)
Potassium: 4.1 mmol/L (ref 3.5–5.1)
Sodium: 141 mmol/L (ref 135–145)

## 2015-01-11 LAB — GLUCOSE, CAPILLARY
GLUCOSE-CAPILLARY: 120 mg/dL — AB (ref 65–99)
Glucose-Capillary: 91 mg/dL (ref 65–99)
Glucose-Capillary: 81 mg/dL (ref 65–99)
Glucose-Capillary: 96 mg/dL (ref 65–99)

## 2015-01-11 SURGERY — IRRIGATION AND DEBRIDEMENT WOUND
Anesthesia: General | Site: Finger | Laterality: Left

## 2015-01-11 MED ORDER — FENTANYL CITRATE (PF) 250 MCG/5ML IJ SOLN
INTRAMUSCULAR | Status: AC
  Filled 2015-01-11: qty 5

## 2015-01-11 MED ORDER — MIDAZOLAM HCL 2 MG/2ML IJ SOLN
INTRAMUSCULAR | Status: AC
  Filled 2015-01-11: qty 2

## 2015-01-11 MED ORDER — DEXAMETHASONE SODIUM PHOSPHATE 4 MG/ML IJ SOLN
INTRAMUSCULAR | Status: AC
  Filled 2015-01-11: qty 1

## 2015-01-11 MED ORDER — DEXAMETHASONE SODIUM PHOSPHATE 10 MG/ML IJ SOLN
INTRAMUSCULAR | Status: DC | PRN
  Administered 2015-01-11: 4 mg via INTRAVENOUS

## 2015-01-11 MED ORDER — SUCCINYLCHOLINE CHLORIDE 20 MG/ML IJ SOLN
INTRAMUSCULAR | Status: AC
  Filled 2015-01-11: qty 1

## 2015-01-11 MED ORDER — HYDROMORPHONE HCL 1 MG/ML IJ SOLN: 0.2500 mg | INTRAMUSCULAR | Status: DC | PRN

## 2015-01-11 MED ORDER — HYDROCODONE-ACETAMINOPHEN 5-325 MG PO TABS
1.0000 | ORAL_TABLET | Freq: Once | ORAL | Status: AC
  Administered 2015-01-11: 1 via ORAL

## 2015-01-11 MED ORDER — MIDAZOLAM HCL 5 MG/5ML IJ SOLN
INTRAMUSCULAR | Status: DC | PRN
  Administered 2015-01-11: 1 mg via INTRAVENOUS

## 2015-01-11 MED ORDER — LIDOCAINE HCL (CARDIAC) 20 MG/ML IV SOLN
INTRAVENOUS | Status: AC
  Filled 2015-01-11: qty 5

## 2015-01-11 MED ORDER — PROPOFOL 10 MG/ML IV BOLUS
INTRAVENOUS | Status: DC | PRN
  Administered 2015-01-11: 200 mg via INTRAVENOUS

## 2015-01-11 MED ORDER — HYDROCODONE-ACETAMINOPHEN 5-325 MG PO TABS
ORAL_TABLET | ORAL | Status: AC
  Filled 2015-01-11: qty 1

## 2015-01-11 MED ORDER — BUPIVACAINE HCL (PF) 0.25 % IJ SOLN
INTRAMUSCULAR | Status: AC
  Filled 2015-01-11: qty 30

## 2015-01-11 MED ORDER — ONDANSETRON HCL 4 MG/2ML IJ SOLN
INTRAMUSCULAR | Status: AC
  Filled 2015-01-11: qty 2

## 2015-01-11 MED ORDER — PROPOFOL 10 MG/ML IV BOLUS
INTRAVENOUS | Status: AC
  Filled 2015-01-11: qty 20

## 2015-01-11 MED ORDER — PHENYLEPHRINE HCL 10 MG/ML IJ SOLN
INTRAMUSCULAR | Status: DC | PRN
  Administered 2015-01-11 (×4): 80 ug via INTRAVENOUS

## 2015-01-11 MED ORDER — BUPIVACAINE HCL (PF) 0.25 % IJ SOLN
INTRAMUSCULAR | Status: DC | PRN
  Administered 2015-01-11: 6 mL

## 2015-01-11 MED ORDER — LACTATED RINGERS IV SOLN
INTRAVENOUS | Status: DC | PRN
  Administered 2015-01-11 (×2): via INTRAVENOUS

## 2015-01-11 MED ORDER — 0.9 % SODIUM CHLORIDE (POUR BTL) OPTIME
TOPICAL | Status: DC | PRN
  Administered 2015-01-11: 1000 mL

## 2015-01-11 MED ORDER — HYDROCODONE-ACETAMINOPHEN 5-325 MG PO TABS: 1.0000 | ORAL_TABLET | Freq: Four times a day (QID) | ORAL | Status: DC | PRN

## 2015-01-11 MED ORDER — LACTATED RINGERS IV SOLN
INTRAVENOUS | Status: DC
  Administered 2015-01-11: 15:00:00 via INTRAVENOUS

## 2015-01-11 MED ORDER — ONDANSETRON HCL 4 MG/2ML IJ SOLN
INTRAMUSCULAR | Status: DC | PRN
  Administered 2015-01-11: 4 mg via INTRAVENOUS

## 2015-01-11 MED ORDER — FENTANYL CITRATE (PF) 100 MCG/2ML IJ SOLN
INTRAMUSCULAR | Status: DC | PRN
  Administered 2015-01-11 (×2): 50 ug via INTRAVENOUS

## 2015-01-11 MED ORDER — METOCLOPRAMIDE HCL 5 MG/ML IJ SOLN
INTRAMUSCULAR | Status: DC | PRN
  Administered 2015-01-11: 5 mg via INTRAVENOUS

## 2015-01-11 MED ORDER — CHLORHEXIDINE GLUCONATE 4 % EX LIQD
60.0000 mL | Freq: Once | CUTANEOUS | Status: DC
  Filled 2015-01-11: qty 60

## 2015-01-11 SURGICAL SUPPLY — 50 items
BANDAGE ELASTIC 3 VELCRO ST LF (GAUZE/BANDAGES/DRESSINGS) IMPLANT
BANDAGE ELASTIC 4 VELCRO ST LF (GAUZE/BANDAGES/DRESSINGS) IMPLANT
BNDG COHESIVE 1X5 TAN STRL LF (GAUZE/BANDAGES/DRESSINGS) ×3 IMPLANT
BNDG CONFORM 2 STRL LF (GAUZE/BANDAGES/DRESSINGS) ×3 IMPLANT
BNDG GAUZE ELAST 4 BULKY (GAUZE/BANDAGES/DRESSINGS) IMPLANT
CORDS BIPOLAR (ELECTRODE) ×3 IMPLANT
COVER SURGICAL LIGHT HANDLE (MISCELLANEOUS) ×3 IMPLANT
CUFF TOURNIQUET SINGLE 18IN (TOURNIQUET CUFF) ×3 IMPLANT
CUFF TOURNIQUET SINGLE 24IN (TOURNIQUET CUFF) IMPLANT
DECANTER SPIKE VIAL GLASS SM (MISCELLANEOUS) IMPLANT
DRAPE SURG 17X23 STRL (DRAPES) ×3 IMPLANT
DRSG EMULSION OIL 3X3 NADH (GAUZE/BANDAGES/DRESSINGS) ×2 IMPLANT
GAUZE SPONGE 2X2 8PLY STRL LF (GAUZE/BANDAGES/DRESSINGS) ×1 IMPLANT
GAUZE SPONGE 4X4 12PLY STRL (GAUZE/BANDAGES/DRESSINGS) IMPLANT
GAUZE XEROFORM 1X8 LF (GAUZE/BANDAGES/DRESSINGS) ×3 IMPLANT
GLOVE BIOGEL M STRL SZ7.5 (GLOVE) IMPLANT
GLOVE SS BIOGEL STRL SZ 8 (GLOVE) ×1 IMPLANT
GLOVE SUPERSENSE BIOGEL SZ 8 (GLOVE) ×2
GOWN STRL REUS W/ TWL LRG LVL3 (GOWN DISPOSABLE) ×2 IMPLANT
GOWN STRL REUS W/ TWL XL LVL3 (GOWN DISPOSABLE) ×1 IMPLANT
GOWN STRL REUS W/TWL LRG LVL3 (GOWN DISPOSABLE) ×6
GOWN STRL REUS W/TWL XL LVL3 (GOWN DISPOSABLE) ×3
KIT BASIN OR (CUSTOM PROCEDURE TRAY) ×3 IMPLANT
KIT ROOM TURNOVER OR (KITS) ×3 IMPLANT
LOOP VESSEL MAXI BLUE (MISCELLANEOUS) IMPLANT
MANIFOLD NEPTUNE II (INSTRUMENTS) IMPLANT
NEEDLE HYPO 25GX1X1/2 BEV (NEEDLE) ×3 IMPLANT
NS IRRIG 1000ML POUR BTL (IV SOLUTION) ×3 IMPLANT
PACK ORTHO EXTREMITY (CUSTOM PROCEDURE TRAY) ×3 IMPLANT
PAD ARMBOARD 7.5X6 YLW CONV (MISCELLANEOUS) ×6 IMPLANT
PAD CAST 4YDX4 CTTN HI CHSV (CAST SUPPLIES) IMPLANT
PADDING CAST COTTON 4X4 STRL (CAST SUPPLIES)
SOLUTION BETADINE 4OZ (MISCELLANEOUS) ×1 IMPLANT
SPEAR EYE SURG WECK-CEL (MISCELLANEOUS) IMPLANT
SPECIMEN JAR SMALL (MISCELLANEOUS) IMPLANT
SPONGE GAUZE 2X2 STER 10/PKG (GAUZE/BANDAGES/DRESSINGS) ×2
SPONGE SCRUB IODOPHOR (GAUZE/BANDAGES/DRESSINGS) ×3 IMPLANT
SUCTION FRAZIER TIP 10 FR DISP (SUCTIONS) IMPLANT
SUT CHROMIC 5 0 P 3 (SUTURE) ×6 IMPLANT
SUT MERSILENE 4 0 P 3 (SUTURE) IMPLANT
SUT PROLENE 4 0 PS 2 18 (SUTURE) IMPLANT
SUT VIC AB 2-0 CT1 27 (SUTURE)
SUT VIC AB 2-0 CT1 TAPERPNT 27 (SUTURE) IMPLANT
SYR CONTROL 10ML LL (SYRINGE) IMPLANT
TOWEL OR 17X24 6PK STRL BLUE (TOWEL DISPOSABLE) ×3 IMPLANT
TOWEL OR 17X26 10 PK STRL BLUE (TOWEL DISPOSABLE) ×3 IMPLANT
TUBE CONNECTING 12'X1/4 (SUCTIONS)
TUBE CONNECTING 12X1/4 (SUCTIONS) IMPLANT
UNDERPAD 30X30 INCONTINENT (UNDERPADS AND DIAPERS) ×3 IMPLANT
WATER STERILE IRR 1000ML POUR (IV SOLUTION) IMPLANT

## 2015-01-11 NOTE — Anesthesia Preprocedure Evaluation (Addendum)
Anesthesia Evaluation  Patient identified by MRN, date of birth, ID band Patient awake    Reviewed: Allergy & Precautions, H&P , NPO status , Patient's Chart, lab work & pertinent test results  History of Anesthesia Complications (+) PONV  Airway Mallampati: I  TM Distance: >3 FB Neck ROM: Full    Dental no notable dental hx. (+) Teeth Intact, Dental Advisory Given   Pulmonary neg pulmonary ROS,  breath sounds clear to auscultation  Pulmonary exam normal       Cardiovascular hypertension, Pt. on medications Rhythm:Regular Rate:Normal     Neuro/Psych negative neurological ROS  negative psych ROS   GI/Hepatic Neg liver ROS, GERD-  Medicated and Controlled,  Endo/Other  diabetes, Type 2, Oral Hypoglycemic Agents  Renal/GU negative Renal ROS  negative genitourinary   Musculoskeletal   Abdominal   Peds  Hematology negative hematology ROS (+)   Anesthesia Other Findings   Reproductive/Obstetrics negative OB ROS                            Anesthesia Physical Anesthesia Plan  ASA: II  Anesthesia Plan: General   Post-op Pain Management:    Induction: Intravenous  Airway Management Planned: LMA  Additional Equipment:   Intra-op Plan:   Post-operative Plan: Extubation in OR  Informed Consent: I have reviewed the patients History and Physical, chart, labs and discussed the procedure including the risks, benefits and alternatives for the proposed anesthesia with the patient or authorized representative who has indicated his/her understanding and acceptance.   Dental advisory given  Plan Discussed with: CRNA  Anesthesia Plan Comments:         Anesthesia Quick Evaluation
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
## 2015-01-11 NOTE — Discharge Instructions (Signed)
Come to Dr Vanetta Shawl office at 10 am Monday  Please move fingers frequently and keep your bandage clean and dry  Keep bandage clean and dry.  Call for any problems.  No smoking.  Criteria for driving a car: you should be off your pain medicine for 7-8 hours, able to drive one handed(confident), thinking clearly and feeling able in your judgement to drive. Continue elevation as it will decrease swelling.  If instructed by MD move your fingers within the confines of the bandage/splint.  Use ice if instructed by your MD. Call immediately for any sudden loss of feeling in your hand/arm or change in functional abilities of the extremity.

## 2015-01-11 NOTE — Transfer of Care (Signed)
Immediate Anesthesia Transfer of Care Note  Patient: Michael Campos  Procedure(s) Performed: Procedure(s): TAKE DOWN CROSS FINGER FLAP IRRIGATION AND DEBRIDEMENT LEFT INDEX AND MIDDLE FINGER (Left)  Patient Location: PACU  Anesthesia Type:General  Level of Consciousness: awake, alert , oriented and patient cooperative  Airway & Oxygen Therapy: Patient Spontanous Breathing and Patient connected to nasal cannula oxygen  Post-op Assessment: Report given to RN, Post -op Vital signs reviewed and stable and Patient moving all extremities  Post vital signs: Reviewed and stable  Last Vitals:  Filed Vitals:   01/11/15 1424  BP: 117/68  Pulse: 70  Temp: 37 C  Resp: 18    Complications: No apparent anesthesia complications

## 2015-01-11 NOTE — Op Note (Signed)
See dictation #[id number] Amedeo Plenty MD

## 2015-01-11 NOTE — Anesthesia Procedure Notes (Signed)
Procedure Name: LMA Insertion Date/Time: 01/11/2015 4:59 PM Performed by: Izora Gala Pre-anesthesia Checklist: Patient identified, Emergency Drugs available, Suction available and Patient being monitored Patient Re-evaluated:Patient Re-evaluated prior to inductionOxygen Delivery Method: Circle system utilized Preoxygenation: Pre-oxygenation with 100% oxygen Intubation Type: IV induction Ventilation: Mask ventilation without difficulty LMA: LMA inserted LMA Size: 5.0 Tube type: Oral Number of attempts: 1 Placement Confirmation: positive ETCO2 Tube secured with: Tape

## 2015-01-12 NOTE — Op Note (Signed)
Michael Campos, Michael Campos     ACCOUNT NO.:  [id number]  MEDICAL RECORD NO.:  [id number]  LOCATION:  [facility]                         FACILITY:  [facility]  PHYSICIAN:  Satira Anis. Delrose Rohwer, M.D.DATE OF BIRTH:  1950/12/08  DATE OF PROCEDURE: DATE OF DISCHARGE:  01/11/2015                              OPERATIVE REPORT   PREOPERATIVE DIAGNOSIS:  Status post cross-finger flap, left hand, middle to index finger.  POSTOPERATIVE DIAGNOSIS:  Status post cross-finger flap, left hand, middle to index finger.  PROCEDURES: 1. Takedown cross-finger flap, left middle to index finger. 2. Debridement of skin and subcutaneous tissue with closure of 1 cm     wound, left middle finger. 3. Irrigation and debridement of skin and subcutaneous tissue, left     index finger with closure of a 1 cm wound. 4. Manipulation under anesthesia, left index, middle, ring and small     fingers about the distal interphalangeal, proximal interphalangeal     and metacarpophalangeal joints.  SURGEON:  Satira Anis. Amedeo Plenty, M.D.  ASSISTANT:  None.  COMPLICATION:  None.  ANESTHESIA:  General.  TOURNIQUET TIME:  Zero.  INDICATIONS:  Pleasant male, presents for takedown of cross-finger flap. Flap looks well.  I have discussed with him the risks and benefits, and he desires to proceed.  All questions have been encouraged and answered preoperatively.  OPERATIVE PROCEDURE:  The patient was seen by myself and Anesthesia, taken to the operative theater and underwent a smooth induction of general anesthesia.  Time-out was called.  He was prepped and draped in usual sterile fashion.  Following this, the patient underwent takedown with sharp knife blade of a cross-finger flap and the flap looked good. There was good bleeding tissue on the proximal and distal ends.  I was pleased with the refill.  I then performed debridement of skin and subcutaneous tissue about the index finger.  This was an excisional debridement with  copious irrigation applied followed by closure of 1 cm wound with chromic suture with 5-0 variety.  Following this, middle finger underwent a debridement of skin and subcutaneous tissue followed by closure of a 1 cm wound.  The patient tolerated this well.  We debrided the skin about all areas, this was a partial thickness area, skin debridement about thumb, ring and small fingers.  Following this, he underwent manipulation under anesthesia of the DIP, PIP and MCP joints.  He tolerated this well. There were no complicating features.  Once this was complete, the patient then underwent a very careful and cautious approach to the extremity with sterile soft tissue bandage.  He looked quite well.  Adaptic, Xeroform and sterile bandage were placed.  I placed 8 mL of Sensorcaine with epinephrine and wound for postop analgesia.  He tolerated the procedure well.  There were no complicating features.  All sponge, needle, and instrument counts were reported as correct.  He will be monitored and then discharged home.  We will see him back on Monday.  Should any problems occur, he will notify me.  Do's and don'ts have been discussed and all questions have been encouraged and answered. Pleasure to see and treat him today and we will look forward in participating in his postoperative recovery.     Satira Anis.  Amedeo Plenty, M.D.     [facility]  D:  01/11/2015  T:  01/12/2015  Job:  [id number]

## 2015-01-15 ENCOUNTER — Encounter (HOSPITAL_COMMUNITY): Payer: Self-pay | Admitting: Orthopedic Surgery

## 2015-01-15 NOTE — Anesthesia Postprocedure Evaluation (Signed)
  Anesthesia Post-op Note  Patient: Michael Campos  Procedure(s) Performed: Procedure(s) (LRB): TAKE DOWN CROSS FINGER FLAP IRRIGATION AND DEBRIDEMENT LEFT INDEX AND MIDDLE FINGER (Left)  Patient Location: PACU  Anesthesia Type: General  Level of Consciousness: awake and alert   Airway and Oxygen Therapy: Patient Spontanous Breathing  Post-op Pain: mild  Post-op Assessment: Post-op Vital signs reviewed, Patient's Cardiovascular Status Stable, Respiratory Function Stable, Patent Airway and No signs of Nausea or vomiting  Last Vitals:  Filed Vitals:   01/11/15 1849  BP:   Pulse: 63  Temp:   Resp: 14    Post-op Vital Signs: stable   Complications: No apparent anesthesia complications

## 2015-01-18 ENCOUNTER — Ambulatory Visit: Payer: No Typology Code available for payment source | Admitting: Internal Medicine

## 2015-01-23 ENCOUNTER — Ambulatory Visit: Payer: No Typology Code available for payment source | Attending: Orthopedic Surgery | Admitting: Occupational Therapy

## 2015-01-23 DIAGNOSIS — M6289 Other specified disorders of muscle: Secondary | ICD-10-CM | POA: Insufficient documentation

## 2015-01-23 DIAGNOSIS — M25642 Stiffness of left hand, not elsewhere classified: Secondary | ICD-10-CM | POA: Insufficient documentation

## 2015-01-23 DIAGNOSIS — R29898 Other symptoms and signs involving the musculoskeletal system: Secondary | ICD-10-CM

## 2015-01-23 DIAGNOSIS — M79645 Pain in left finger(s): Secondary | ICD-10-CM | POA: Insufficient documentation
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
## 2015-01-25 ENCOUNTER — Encounter: Payer: Self-pay | Admitting: Occupational Therapy

## 2015-01-25 ENCOUNTER — Ambulatory Visit: Payer: No Typology Code available for payment source | Admitting: Occupational Therapy

## 2015-01-25 DIAGNOSIS — M25642 Stiffness of left hand, not elsewhere classified: Secondary | ICD-10-CM

## 2015-01-25 DIAGNOSIS — M79645 Pain in left finger(s): Secondary | ICD-10-CM
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
## 2015-01-25 NOTE — Patient Instructions (Signed)
MP Flexion (Active Isolated)   Bend __index, middle____ finger at large knuckle, keeping other fingers straight. Do not bend tips. Repeat _10-15___ times. Do __4-6__ sessions per day.  AROM: PIP Flexion / Extension   Pinch bottom knuckle of __index, middle______ finger of hand to prevent bending. Actively bend middle knuckle until stretch is felt. Hold __5__ seconds. Relax. Straighten finger as far as possible. Repeat __10-15__ times per set. Do _4-6___ sessions per day.   AROM: DIP Flexion / Extension   Pinch middle knuckle of ___index, middle_____ finger of  hand to prevent bending. Bend end knuckle until stretch is felt. Hold _5___ seconds. Relax. Straighten finger as far as possible. Repeat _10-15___ times per set.  Do _4-6___ sessions per day.  AROM: Finger Flexion / Extension   Actively bend fingers of  hand. Start with knuckles furthest from palm, and slowly make a fist. Hold __5__ seconds. Relax. Then straighten fingers as far as possible. Repeat _10-15___ times per set.  Do _4-6___ sessions per day.   PROM: Finger MP Joints   Passively bend __index, middle______ finger of hand at big knuckle until stretch is felt. Hold _10___ seconds. Relax. Straighten finger as far as possible. Repeat __5__ times per set.  Do __4-6__ sessions per day.   PIP Flexion (Passive)   Use other hand to bend the middle joint of __index, middle____ finger down as far as possible. Hold _10___ seconds. Repeat __5__ times. Do _4-6___ sessions per day.    PROM: Finger DIP Joints   Passively bend __index, middle______ finger(s) of  hand at tip joint until stretch is felt. Hold __10__ seconds. Relax. Straighten finger as far as possible. Repeat _5___ times per set.  Do __4-6__ sessions per day.  Copyright  VHI. All rights reserved.

## 2015-02-06 ENCOUNTER — Encounter: Payer: Self-pay | Admitting: Occupational Therapy

## 2015-02-06 ENCOUNTER — Ambulatory Visit: Payer: No Typology Code available for payment source | Attending: Orthopedic Surgery | Admitting: Occupational Therapy

## 2015-02-06 DIAGNOSIS — M6289 Other specified disorders of muscle: Secondary | ICD-10-CM | POA: Insufficient documentation

## 2015-02-06 DIAGNOSIS — M25642 Stiffness of left hand, not elsewhere classified: Secondary | ICD-10-CM | POA: Insufficient documentation

## 2015-02-06 DIAGNOSIS — M79645 Pain in left finger(s): Secondary | ICD-10-CM | POA: Insufficient documentation

## 2015-02-06 DIAGNOSIS — R29898 Other symptoms and signs involving the musculoskeletal system: Secondary | ICD-10-CM
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
## 2015-02-08 ENCOUNTER — Ambulatory Visit: Payer: No Typology Code available for payment source | Admitting: Occupational Therapy

## 2015-02-08 DIAGNOSIS — R29898 Other symptoms and signs involving the musculoskeletal system: Secondary | ICD-10-CM

## 2015-02-08 DIAGNOSIS — M79645 Pain in left finger(s): Secondary | ICD-10-CM

## 2015-02-08 DIAGNOSIS — M25642 Stiffness of left hand, not elsewhere classified: Secondary | ICD-10-CM
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
## 2015-02-12 ENCOUNTER — Ambulatory Visit: Payer: No Typology Code available for payment source | Admitting: Occupational Therapy

## 2015-02-12 ENCOUNTER — Encounter: Payer: Self-pay | Admitting: Occupational Therapy

## 2015-02-12 DIAGNOSIS — R29898 Other symptoms and signs involving the musculoskeletal system: Secondary | ICD-10-CM

## 2015-02-12 DIAGNOSIS — M25642 Stiffness of left hand, not elsewhere classified: Secondary | ICD-10-CM

## 2015-02-12 DIAGNOSIS — M79645 Pain in left finger(s): Secondary | ICD-10-CM
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
## 2015-02-13 ENCOUNTER — Encounter: Payer: No Typology Code available for payment source | Admitting: Occupational Therapy

## 2015-02-14 ENCOUNTER — Ambulatory Visit: Payer: No Typology Code available for payment source | Admitting: Occupational Therapy

## 2015-02-14 ENCOUNTER — Encounter: Payer: Self-pay | Admitting: Occupational Therapy

## 2015-02-14 DIAGNOSIS — M25642 Stiffness of left hand, not elsewhere classified: Secondary | ICD-10-CM

## 2015-02-14 DIAGNOSIS — M79645 Pain in left finger(s): Secondary | ICD-10-CM

## 2015-02-14 DIAGNOSIS — R29898 Other symptoms and signs involving the musculoskeletal system: Secondary | ICD-10-CM
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
## 2015-02-15 ENCOUNTER — Encounter: Payer: No Typology Code available for payment source | Admitting: Occupational Therapy

## 2015-02-19 ENCOUNTER — Encounter: Payer: Self-pay | Admitting: Occupational Therapy

## 2015-02-19 ENCOUNTER — Ambulatory Visit: Payer: No Typology Code available for payment source | Admitting: Occupational Therapy

## 2015-02-19 DIAGNOSIS — R29898 Other symptoms and signs involving the musculoskeletal system: Secondary | ICD-10-CM

## 2015-02-19 DIAGNOSIS — M79645 Pain in left finger(s): Secondary | ICD-10-CM

## 2015-02-19 DIAGNOSIS — M25642 Stiffness of left hand, not elsewhere classified: Secondary | ICD-10-CM
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
## 2015-02-20 ENCOUNTER — Ambulatory Visit: Payer: No Typology Code available for payment source | Admitting: Occupational Therapy

## 2015-02-20 ENCOUNTER — Encounter: Payer: Self-pay | Admitting: Occupational Therapy

## 2015-02-20 DIAGNOSIS — M79645 Pain in left finger(s): Secondary | ICD-10-CM

## 2015-02-20 DIAGNOSIS — M25642 Stiffness of left hand, not elsewhere classified: Secondary | ICD-10-CM

## 2015-02-20 DIAGNOSIS — R29898 Other symptoms and signs involving the musculoskeletal system: Secondary | ICD-10-CM
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
## 2015-02-21 ENCOUNTER — Encounter: Payer: No Typology Code available for payment source | Admitting: Occupational Therapy

## 2015-02-26 ENCOUNTER — Ambulatory Visit: Payer: No Typology Code available for payment source | Admitting: Occupational Therapy

## 2015-02-26 ENCOUNTER — Encounter: Payer: Self-pay | Admitting: Occupational Therapy

## 2015-02-26 DIAGNOSIS — M79645 Pain in left finger(s): Secondary | ICD-10-CM

## 2015-02-26 DIAGNOSIS — R29898 Other symptoms and signs involving the musculoskeletal system: Secondary | ICD-10-CM

## 2015-02-26 DIAGNOSIS — M25642 Stiffness of left hand, not elsewhere classified: Secondary | ICD-10-CM
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
## 2015-02-27 ENCOUNTER — Encounter: Payer: No Typology Code available for payment source | Admitting: Occupational Therapy

## 2015-02-28 ENCOUNTER — Encounter: Payer: Self-pay | Admitting: Occupational Therapy

## 2015-02-28 ENCOUNTER — Ambulatory Visit: Payer: No Typology Code available for payment source | Admitting: Occupational Therapy

## 2015-02-28 DIAGNOSIS — M79645 Pain in left finger(s): Secondary | ICD-10-CM

## 2015-02-28 DIAGNOSIS — R29898 Other symptoms and signs involving the musculoskeletal system: Secondary | ICD-10-CM

## 2015-02-28 DIAGNOSIS — M25642 Stiffness of left hand, not elsewhere classified: Secondary | ICD-10-CM
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
## 2015-03-01 ENCOUNTER — Encounter: Payer: No Typology Code available for payment source | Admitting: Occupational Therapy

## 2015-03-06 ENCOUNTER — Ambulatory Visit: Payer: No Typology Code available for payment source | Attending: Orthopedic Surgery | Admitting: Occupational Therapy

## 2015-03-06 DIAGNOSIS — R29898 Other symptoms and signs involving the musculoskeletal system: Secondary | ICD-10-CM

## 2015-03-06 DIAGNOSIS — M6289 Other specified disorders of muscle: Secondary | ICD-10-CM | POA: Insufficient documentation

## 2015-03-06 DIAGNOSIS — M79645 Pain in left finger(s): Secondary | ICD-10-CM | POA: Insufficient documentation

## 2015-03-06 DIAGNOSIS — M25642 Stiffness of left hand, not elsewhere classified: Secondary | ICD-10-CM | POA: Insufficient documentation
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
## 2015-03-08 ENCOUNTER — Encounter: Payer: Self-pay | Admitting: Occupational Therapy

## 2015-03-08 ENCOUNTER — Ambulatory Visit: Payer: No Typology Code available for payment source | Admitting: Occupational Therapy

## 2015-03-08 ENCOUNTER — Encounter: Payer: No Typology Code available for payment source | Admitting: Occupational Therapy

## 2015-03-08 DIAGNOSIS — M25642 Stiffness of left hand, not elsewhere classified: Secondary | ICD-10-CM

## 2015-03-08 DIAGNOSIS — M79645 Pain in left finger(s): Secondary | ICD-10-CM

## 2015-03-08 DIAGNOSIS — R29898 Other symptoms and signs involving the musculoskeletal system: Secondary | ICD-10-CM
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
## 2015-03-13 ENCOUNTER — Encounter: Payer: No Typology Code available for payment source | Admitting: Occupational Therapy

## 2015-03-13 ENCOUNTER — Encounter: Payer: Self-pay | Admitting: Occupational Therapy

## 2015-03-13 ENCOUNTER — Ambulatory Visit: Payer: No Typology Code available for payment source | Admitting: Occupational Therapy

## 2015-03-13 DIAGNOSIS — R29898 Other symptoms and signs involving the musculoskeletal system: Secondary | ICD-10-CM

## 2015-03-13 DIAGNOSIS — M79645 Pain in left finger(s): Secondary | ICD-10-CM

## 2015-03-13 DIAGNOSIS — M25642 Stiffness of left hand, not elsewhere classified: Secondary | ICD-10-CM
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
## 2015-03-15 ENCOUNTER — Encounter: Payer: Self-pay | Admitting: Occupational Therapy

## 2015-03-15 ENCOUNTER — Encounter: Payer: No Typology Code available for payment source | Admitting: Occupational Therapy

## 2015-03-15 ENCOUNTER — Ambulatory Visit: Payer: No Typology Code available for payment source | Admitting: Occupational Therapy

## 2015-03-15 DIAGNOSIS — R29898 Other symptoms and signs involving the musculoskeletal system: Secondary | ICD-10-CM

## 2015-03-15 DIAGNOSIS — M79645 Pain in left finger(s): Secondary | ICD-10-CM

## 2015-03-15 DIAGNOSIS — M25642 Stiffness of left hand, not elsewhere classified: Secondary | ICD-10-CM
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
## 2015-03-17 IMAGING — CR DG ELBOW COMPLETE 3+V*L*
4 series · 4 of 4 positions shown · non-contrast
Comparison: None.

CLINICAL DATA: Left elbow pain, no trauma

LEFT ELBOW - COMPLETE 3+ VIEW

[x elbow ap left]
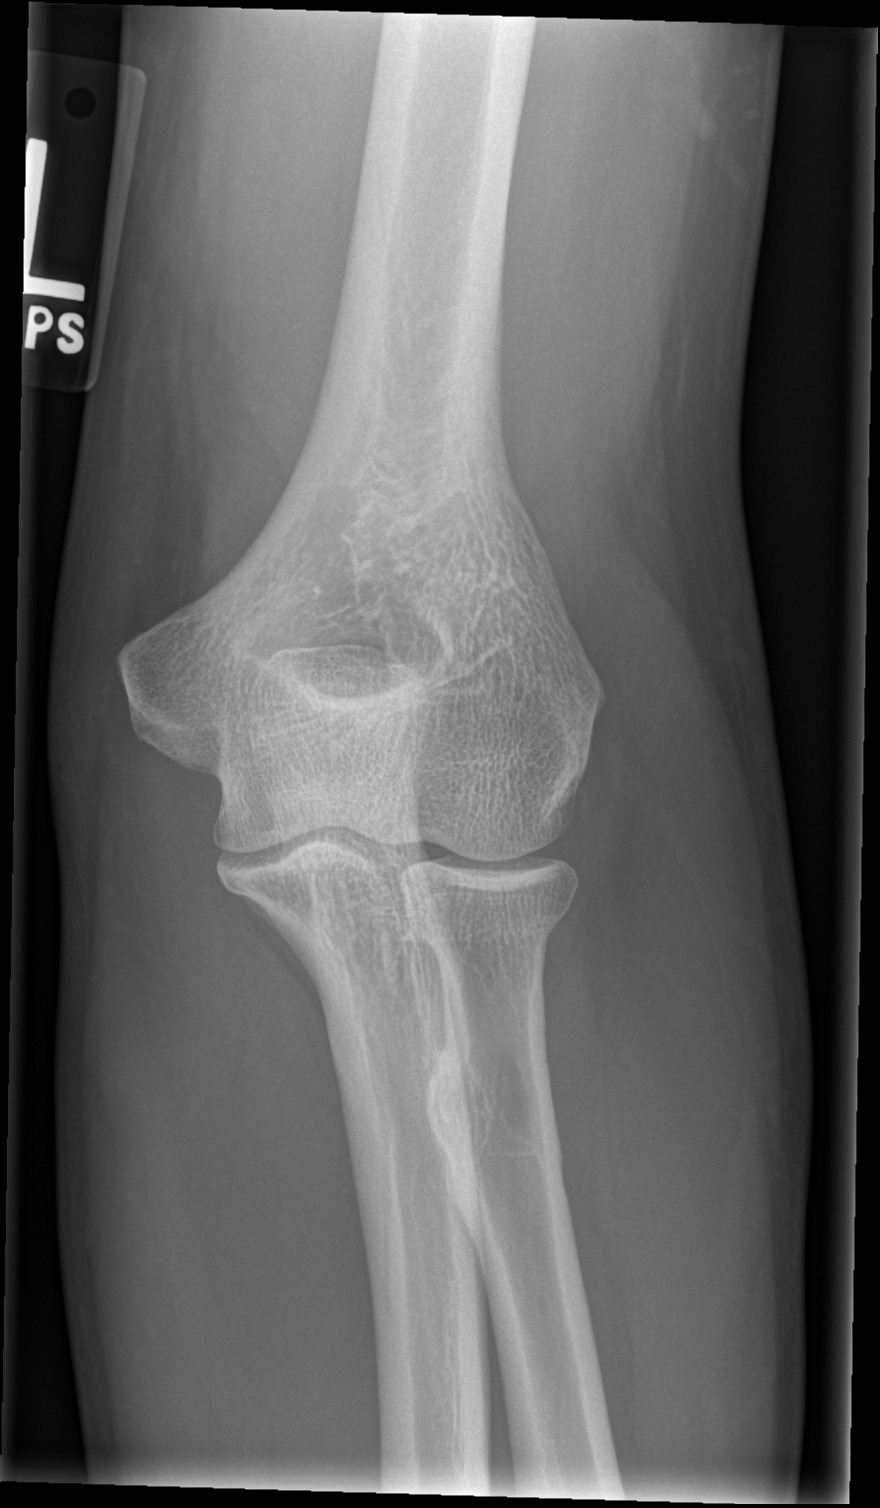

[x elbow obl left (1 of 2)]
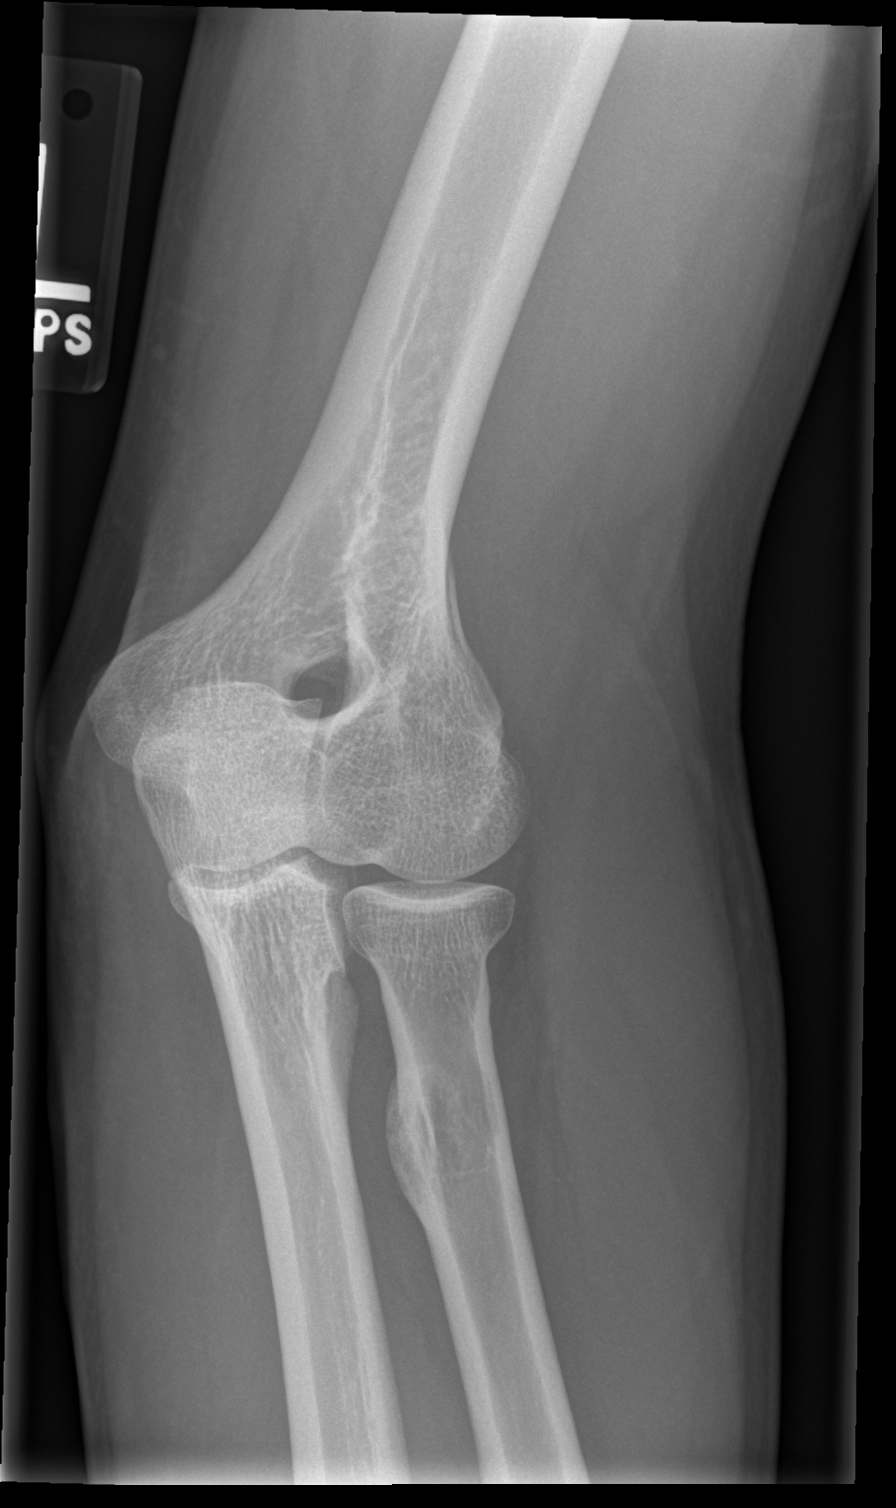

[x elbow obl left (2 of 2)]
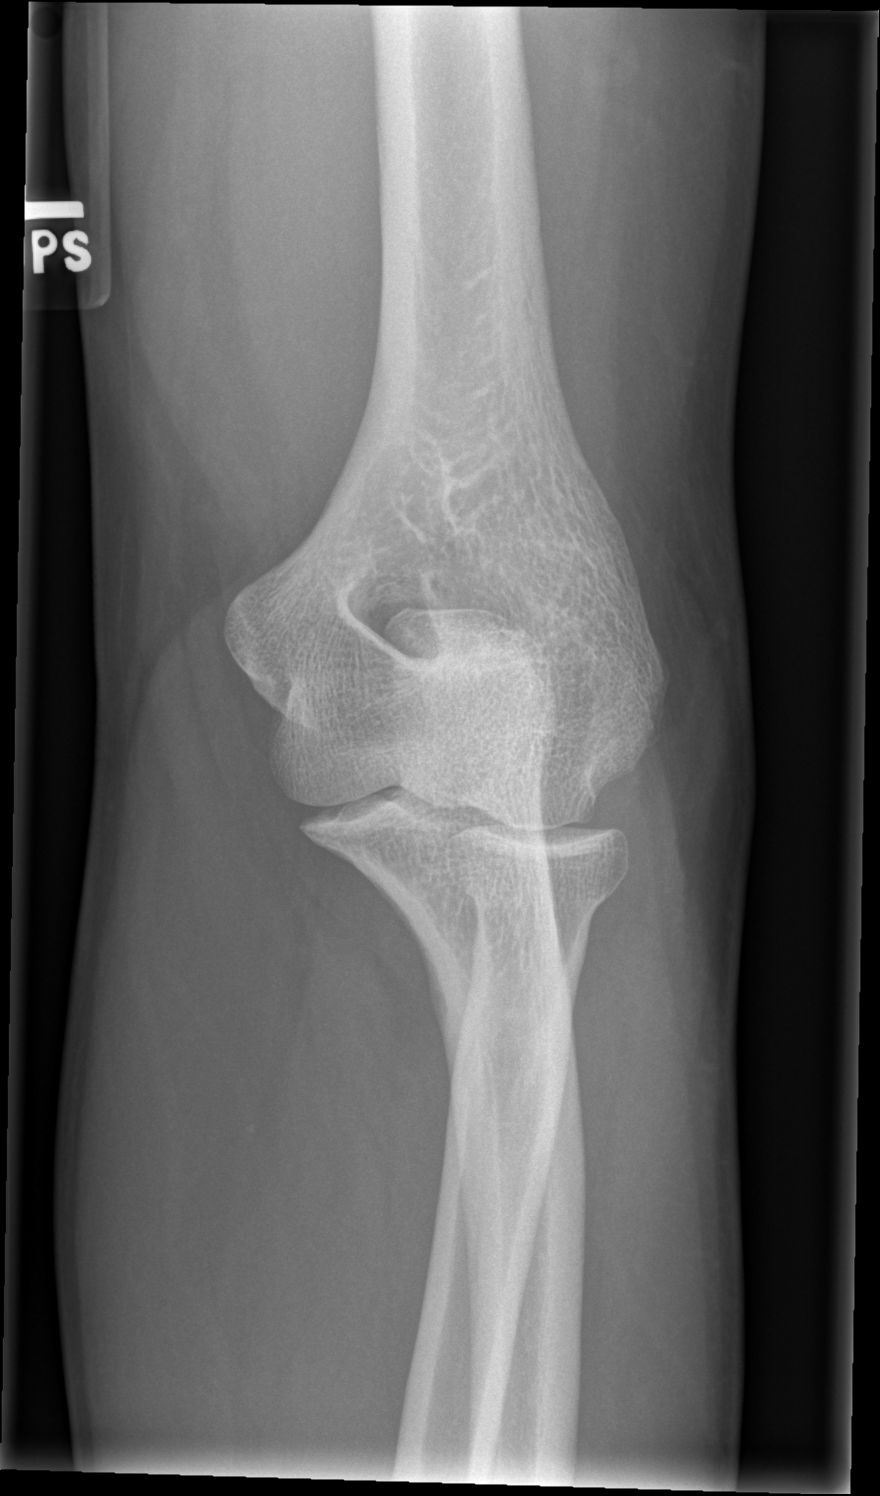

[x elbow lat left]
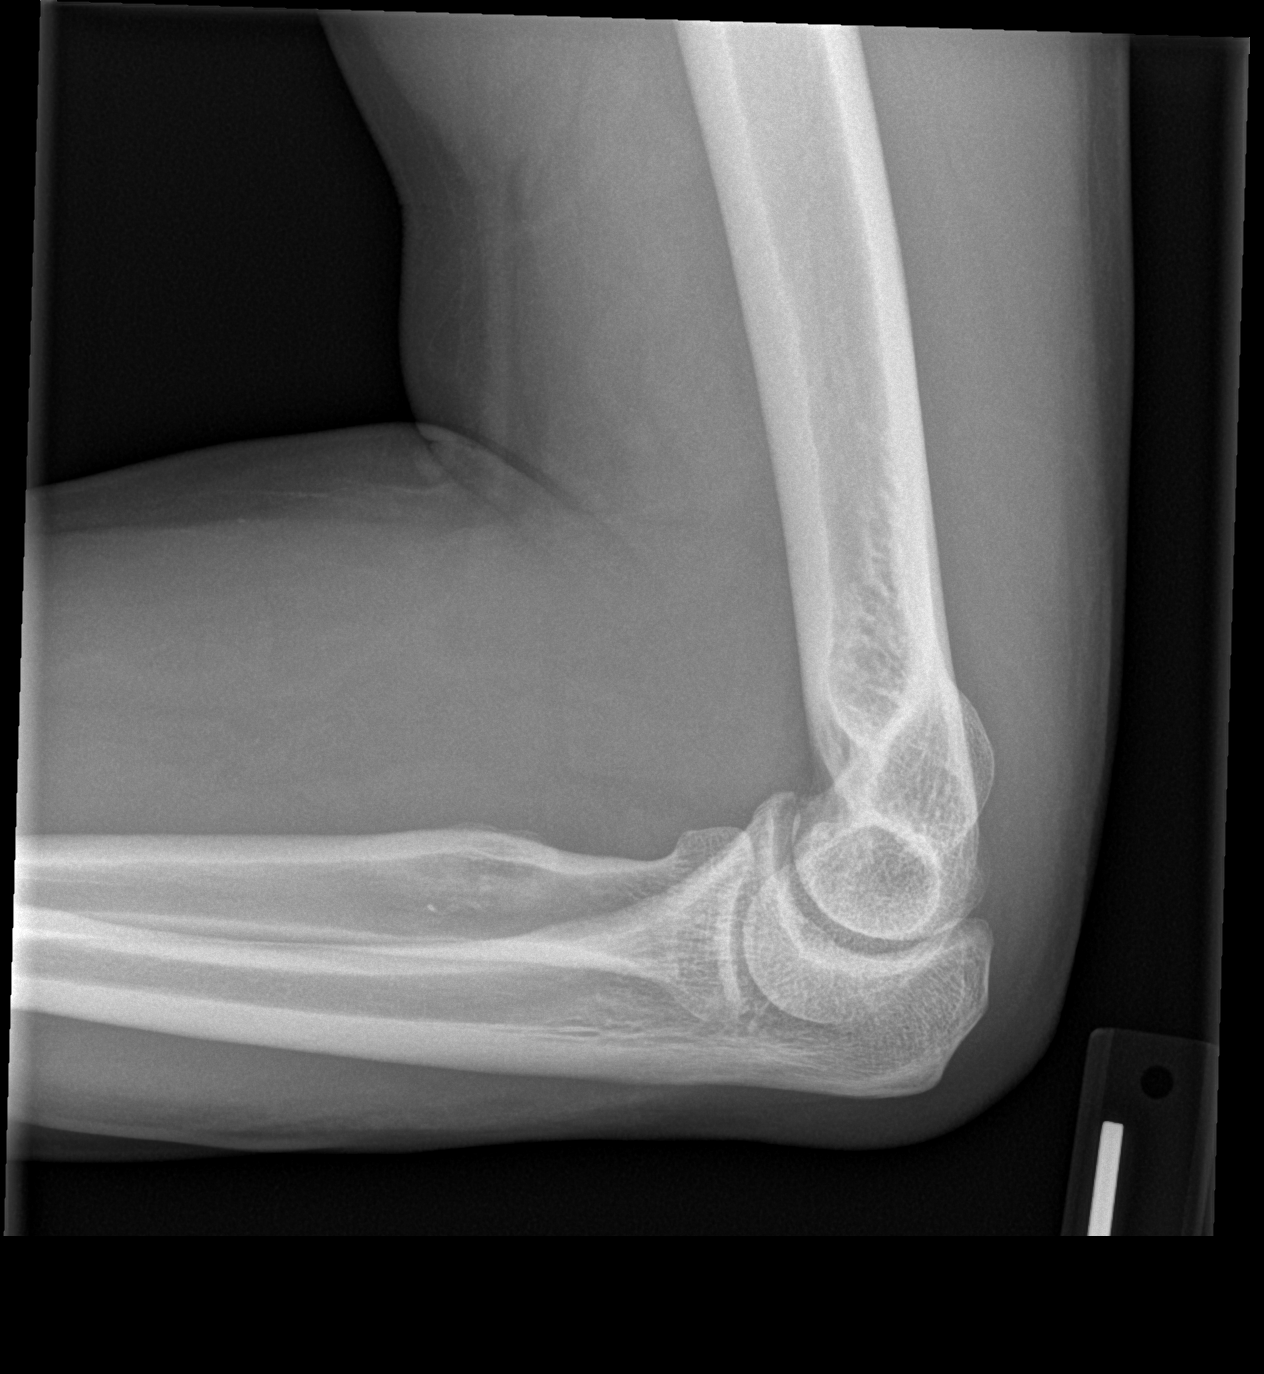

[4 of 4 positions shown; findings below may reference images not displayed]

FINDINGS: No fracture or dislocation.  No soft tissue abnormality.
No radiopaque foreign body.
IMPRESSION: No acute osseous abnormality of the left elbow.

## 2015-03-20 ENCOUNTER — Ambulatory Visit: Payer: No Typology Code available for payment source | Admitting: Occupational Therapy

## 2015-03-20 ENCOUNTER — Encounter: Payer: Self-pay | Admitting: Occupational Therapy

## 2015-03-20 ENCOUNTER — Encounter: Payer: No Typology Code available for payment source | Admitting: Occupational Therapy

## 2015-03-20 DIAGNOSIS — M79645 Pain in left finger(s): Secondary | ICD-10-CM

## 2015-03-20 DIAGNOSIS — R29898 Other symptoms and signs involving the musculoskeletal system: Secondary | ICD-10-CM

## 2015-03-20 DIAGNOSIS — M25642 Stiffness of left hand, not elsewhere classified: Secondary | ICD-10-CM
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
## 2015-03-22 ENCOUNTER — Encounter: Payer: No Typology Code available for payment source | Admitting: Occupational Therapy

## 2015-03-22 ENCOUNTER — Ambulatory Visit: Payer: No Typology Code available for payment source | Admitting: Occupational Therapy

## 2015-03-22 DIAGNOSIS — M79645 Pain in left finger(s): Secondary | ICD-10-CM

## 2015-03-22 DIAGNOSIS — R29898 Other symptoms and signs involving the musculoskeletal system: Secondary | ICD-10-CM

## 2015-03-22 DIAGNOSIS — M25642 Stiffness of left hand, not elsewhere classified: Secondary | ICD-10-CM
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
## 2015-03-27 ENCOUNTER — Other Ambulatory Visit: Payer: Self-pay | Admitting: Internal Medicine

## 2015-03-27 ENCOUNTER — Encounter: Payer: No Typology Code available for payment source | Admitting: Occupational Therapy

## 2015-03-29 ENCOUNTER — Encounter: Payer: No Typology Code available for payment source | Admitting: Occupational Therapy

## 2015-04-03 ENCOUNTER — Encounter: Payer: No Typology Code available for payment source | Admitting: Occupational Therapy

## 2015-04-10 ENCOUNTER — Other Ambulatory Visit: Payer: Self-pay | Admitting: Family Medicine

## 2015-04-11 ENCOUNTER — Other Ambulatory Visit: Payer: Self-pay | Admitting: Family Medicine

## 2015-04-24 ENCOUNTER — Other Ambulatory Visit: Payer: Self-pay | Admitting: Internal Medicine

## 2015-04-30 ENCOUNTER — Other Ambulatory Visit: Payer: Self-pay | Admitting: Internal Medicine

## 2015-04-30 DIAGNOSIS — E785 Hyperlipidemia, unspecified: Secondary | ICD-10-CM

## 2015-04-30 MED ORDER — ROSUVASTATIN CALCIUM 10 MG PO TABS: 10.0000 mg | ORAL_TABLET | Freq: Every day | ORAL | Status: DC

## 2015-05-03 ENCOUNTER — Ambulatory Visit: Payer: No Typology Code available for payment source | Attending: Internal Medicine | Admitting: Internal Medicine

## 2015-05-03 ENCOUNTER — Encounter: Payer: Self-pay | Admitting: Internal Medicine

## 2015-05-03 VITALS — BP 131/79 | HR 70 | Temp 98.4°F | Resp 18 | Ht 66.0 in | Wt 176.2 lb

## 2015-05-03 DIAGNOSIS — I10 Essential (primary) hypertension: Secondary | ICD-10-CM

## 2015-05-03 DIAGNOSIS — Z79899 Other long term (current) drug therapy: Secondary | ICD-10-CM | POA: Insufficient documentation

## 2015-05-03 DIAGNOSIS — E785 Hyperlipidemia, unspecified: Secondary | ICD-10-CM | POA: Insufficient documentation

## 2015-05-03 DIAGNOSIS — E119 Type 2 diabetes mellitus without complications: Secondary | ICD-10-CM

## 2015-05-03 DIAGNOSIS — M545 Low back pain, unspecified: Secondary | ICD-10-CM

## 2015-05-03 LAB — POCT GLYCOSYLATED HEMOGLOBIN (HGB A1C): Hemoglobin A1C: 6.5

## 2015-05-03 LAB — GLUCOSE, POCT (MANUAL RESULT ENTRY): POC Glucose: 139 mg/dl — AB (ref 70–99)

## 2015-05-03 MED ORDER — DICLOFENAC SODIUM 1 % TD GEL: 2.0000 g | Freq: Four times a day (QID) | TRANSDERMAL | Status: DC

## 2015-05-03 MED ORDER — LISINOPRIL 5 MG PO TABS: 5.0000 mg | ORAL_TABLET | Freq: Every day | ORAL | Status: DC

## 2015-05-03 MED ORDER — ROSUVASTATIN CALCIUM 10 MG PO TABS: 10.0000 mg | ORAL_TABLET | Freq: Every day | ORAL | Status: DC

## 2015-05-03 MED ORDER — GLIMEPIRIDE 2 MG PO TABS: 2.0000 mg | ORAL_TABLET | Freq: Every day | ORAL | Status: DC

## 2015-05-03 NOTE — Progress Notes (Signed)
Virtual interpreter used Marcela ID# [id number] Patient here for follow up on his diabetes Patient also complains of right side lower back pain Patient thinks he pulled it when he was getting tools out from under a bench But not really sure

## 2015-05-03 NOTE — Progress Notes (Addendum)
Patient ID: Michael Campos, male   DOB: June 23, 1951, 64 y.o.   MRN: [id number]  CC: Diabetes and hypertension follow-up  HPI: Michael Campos is a 64 y.o. male here today for a follow up visit.  Patient has past medical history of T2DM, HTN, and HLD. Patient currently takes Lisinopril for HTN and denies any side effects of cough, headache, SOB. Patient notes compliance with glimepiride and ADA diet recommendations. He checks his sugars once per day and has no reported hypoglycemic events. He denies blurred vision, neuropathy, polyuria, polydipsia. Patient also complains of low back pain when he does excessive bending at working or lifting heavy tools. He has been using Voltaren gel for pain which helps significantly.   No Known Allergies Past Medical History  Diagnosis Date  . Diabetes mellitus   . Hypertension   . Hyperlipidemia   . PONV (postoperative nausea and vomiting)    Current Outpatient Prescriptions on File Prior to Visit  Medication Sig Dispense Refill  . calcium carbonate (OS-CAL - DOSED IN MG OF ELEMENTAL CALCIUM) 1250 (500 CA) MG tablet Take 1 tablet by mouth daily with breakfast.    . Calcium Carbonate-Vitamin D (CALCIUM + D PO) Take by mouth.    . diclofenac sodium (VOLTAREN) 1 % GEL Apply 2 g topically 4 (four) times daily. 2 Tube 12  . glimepiride (AMARYL) 2 MG tablet Take 1 tablet (2 mg total) by mouth daily before breakfast. 30 tablet 5  . lisinopril (PRINIVIL,ZESTRIL) 5 MG tablet Take 1 tablet (5 mg total) by mouth daily. 30 tablet 5  . Multiple Vitamins-Minerals (MULTIVITAMIN ADULT PO) Take 1 tablet by mouth daily.    Marland Kitchen omeprazole (PRILOSEC) 40 MG capsule Take 1 capsule (40 mg total) by mouth daily. 30 capsule 4  . rosuvastatin (CRESTOR) 10 MG tablet Take 1 tablet (10 mg total) by mouth daily. 90 tablet 1  . vitamin B-12 (CYANOCOBALAMIN) 1000 MCG tablet Take 1,000 mcg by mouth daily.    Marland Kitchen HYDROcodone-acetaminophen (NORCO) 5-325 MG per tablet Take 1 tablet  by mouth every 6 (six) hours as needed for moderate pain. 30 tablet 0  . oxyCODONE (OXY IR/ROXICODONE) 5 MG immediate release tablet Take 2 tablets (10 mg total) by mouth every 4 (four) hours as needed for moderate pain or severe pain. 45 tablet 0  . TRUETEST TEST test strip USE TO TEST BLOOD SUGARS AS DIRECTED BY PHYSICIAN 50 each 12   No current facility-administered medications on file prior to visit.   History reviewed. No pertinent family history. Social History   Social History  . Marital Status: Married    Spouse Name: N/A  . Number of Children: N/A  . Years of Education: N/A   Occupational History  . Not on file.   Social History Main Topics  . Smoking status: Never Smoker   . Smokeless tobacco: Never Used  . Alcohol Use: No  . Drug Use: No  . Sexual Activity: Not on file   Other Topics Concern  . Not on file   Social History Narrative    Review of Systems: Other than what is stated in HPI, all other systems are negative.   Objective:   Filed Vitals:   05/03/15 1717  BP: 131/79  Pulse: 70  Temp: 98.4 F (36.9 C)  Resp: 18    Physical Exam  Constitutional: He is oriented to person, place, and time.  Cardiovascular: Normal rate, regular rhythm and normal heart sounds.   No murmur heard. Pulses:  Dorsalis pedis pulses are 2+ on the right side, and 2+ on the left side.       Posterior tibial pulses are 2+ on the right side, and 2+ on the left side.  Pulmonary/Chest: Effort normal and breath sounds normal. He has no wheezes.  Musculoskeletal: Normal range of motion. He exhibits no tenderness (lower back).  Feet:  Right Foot:  Skin Integrity: Negative for ulcer or skin breakdown.  Left Foot:  Skin Integrity: Negative for ulcer or skin breakdown.  Neurological: He is alert and oriented to person, place, and time.  Skin: Skin is warm and dry.     Lab Results  Component Value Date   WBC 6.8 01/11/2015   HGB 14.3 01/11/2015   HCT 42.6 01/11/2015    MCV 90.1 01/11/2015   PLT 381 01/11/2015   Lab Results  Component Value Date   CREATININE 0.62 01/11/2015   BUN 10 01/11/2015   NA 141 01/11/2015   K 4.1 01/11/2015   CL 105 01/11/2015   CO2 25 01/11/2015    Lab Results  Component Value Date   HGBA1C 6.50 05/03/2015   Lipid Panel     Component Value Date/Time   CHOL 151 05/15/2014 0913   TRIG 165* 05/15/2014 0913   HDL 50 05/15/2014 0913   CHOLHDL 3.0 05/15/2014 0913   VLDL 33 05/15/2014 0913   LDLCALC 68 05/15/2014 0913       Assessment and plan:   Jumar was seen today for follow-up.  Diagnoses and all orders for this visit:  Type 2 diabetes mellitus without complication -     Glucose (CBG) -     HgB A1c -     Refill glimepiride (AMARYL) 2 MG tablet; Take 1 tablet (2 mg total) by mouth daily before breakfast. Patients diabetes is well control as evidence by consistently low a1c.  Patient will continue with current therapy and continue to make necessary lifestyle changes.  Reviewed foot care, diet, exercise, annual health maintenance with patient.   Essential hypertension -     lisinopril (PRINIVIL,ZESTRIL) 5 MG tablet; Take 1 tablet (5 mg total) by mouth daily. Patient blood pressure is stable and may continue on current medication.  Education on diet, exercise, and modifiable risk factors discussed. Will obtain appropriate labs as needed. Will follow up in 3-6 months.   Dyslipidemia -     Refill rosuvastatin (CRESTOR) 10 MG tablet; Take 1 tablet (10 mg total) by mouth daily. It is time to repeat lipid panel, will get on next follow up visit. Last LDL was 68. For now he will continue lipitor and a daily Asprin 81 mg. He is currently controlled and at goal based off last LDL.  Education provided on proper lifestyle changes in order to lower cholesterol. Patient advised to maintain healthy weight and to keep total fat intake at 25-35% of total calories and carbohydrates 50-60% of total daily calories. Explained how  high cholesterol places patient at risk for heart disease. Patient placed on appropriate medication and repeat labs in 6 months   Midline low back pain without sciatica -     Refill diclofenac sodium (VOLTAREN) 1 % GEL; Apply 2 g topically 4 (four) times daily. Advise heat and back stretches to help with pain. Patient reports pain is not severe enough to require Tramadol currently.    Due to language barrier, an interpreter was present during the history-taking and subsequent discussion (and for part of the physical exam) with this patient.  Return  in about 6 months (around 10/31/2015) for DM/HTN.      Lance Bosch, [facility] [phone] 05/03/2015, 5:23 PM

## 2015-05-08 ENCOUNTER — Ambulatory Visit: Payer: Self-pay | Attending: Internal Medicine

## 2015-05-10 ENCOUNTER — Other Ambulatory Visit: Payer: Self-pay

## 2015-05-10 ENCOUNTER — Other Ambulatory Visit: Payer: Self-pay | Admitting: Internal Medicine

## 2015-05-10 NOTE — Telephone Encounter (Signed)
Patient presents to the clinic stating that he was needing a new Rx for Ibuprofen 800 on his last visit on 05/03/15 but the Rx was never sent to the pharmacy. Please follow up with pt. Pt uses our pharmacy. Thank you.

## 2015-05-10 NOTE — Telephone Encounter (Signed)
Patient came to ask for Ibuprofen refill that was not sent to the pharmacy on last office visit.

## 2015-05-11 ENCOUNTER — Ambulatory Visit: Payer: Self-pay

## 2015-05-21 NOTE — Telephone Encounter (Signed)
Ok to [employer]

## 2015-05-21 NOTE — Telephone Encounter (Signed)
Patient is requesting a refill on ibuprofen 800 mg Were you supposed to send this on his last visit i see we never prescribed this for him Thank you

## 2015-05-22 ENCOUNTER — Telehealth: Payer: Self-pay

## 2015-05-22 MED ORDER — IBUPROFEN 800 MG PO TABS: 800.0000 mg | ORAL_TABLET | Freq: Three times a day (TID) | ORAL | Status: DC | PRN

## 2015-05-22 NOTE — Telephone Encounter (Signed)
Spoke with patient and she is aware her husbands [facility]

## 2015-05-22 NOTE — Addendum Note (Signed)
Addended by: Dorothe Pea on: 05/22/2015 09:27 AM   Modules accepted: Orders

## 2015-08-11 ENCOUNTER — Emergency Department (INDEPENDENT_AMBULATORY_CARE_PROVIDER_SITE_OTHER): Payer: No Typology Code available for payment source

## 2015-08-11 ENCOUNTER — Emergency Department (INDEPENDENT_AMBULATORY_CARE_PROVIDER_SITE_OTHER)
Admission: EM | Admit: 2015-08-11 | Discharge: 2015-08-11 | Disposition: A | Discharge status: Home / Self Care | Payer: No Typology Code available for payment source | Source: Home / Self Care | Attending: Family Medicine | Admitting: Family Medicine

## 2015-08-11 ENCOUNTER — Encounter (HOSPITAL_COMMUNITY): Payer: Self-pay | Admitting: Emergency Medicine

## 2015-08-11 DIAGNOSIS — M549 Dorsalgia, unspecified: Secondary | ICD-10-CM

## 2015-08-11 DIAGNOSIS — M545 Low back pain, unspecified: Secondary | ICD-10-CM

## 2015-08-11 DIAGNOSIS — M47819 Spondylosis without myelopathy or radiculopathy, site unspecified: Secondary | ICD-10-CM

## 2015-08-11 MED ORDER — CYCLOBENZAPRINE HCL 10 MG PO TABS: 10.0000 mg | ORAL_TABLET | Freq: Two times a day (BID) | ORAL | Status: DC | PRN

## 2015-08-11 MED ORDER — TRAMADOL HCL 50 MG PO TABS: 50.0000 mg | ORAL_TABLET | Freq: Four times a day (QID) | ORAL | Status: DC | PRN
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
## 2015-08-11 NOTE — ED Notes (Signed)
C/o back pain x1 month; denies inj/trauma Reports pain is worse in the am but feels better as the days goes by A&O x4... No acute distress.

## 2015-08-11 NOTE — Discharge Instructions (Signed)
Dolor de espalda en adultos  (Back Pain, Adult)  El dolor de espalda es muy frecuente en los adultos. La causa del dolor de espalda es rara vez peligrosa y el dolor a menudo mejora con el tiempo. Es posible que se desconozca la causa de esta afección. Algunas causas comunes son las siguientes:  · Distensión de los músculos o ligamentos que sostienen la columna vertebral.  · Desgaste (degeneración) de los discos vertebrales.  · Artritis.  · Lesiones directas en la espalda.  En muchas personas, el dolor de espalda es recurrente. Como rara vez es peligroso, las personas pueden aprender a manejar esta afección por sí mismas.  INSTRUCCIONES PARA EL CUIDADO EN EL HOGAR  Controle su dolor de espalda a fin de detectar algún cambio. Las siguientes indicaciones ayudarán a aliviar cualquier molestia que pueda sentir:  · Permanezca activo. Si permanece sentado o de pie en un mismo lugar durante mucho tiempo, se tensiona la espalda. No se siente, conduzca o permanezca de pie en un mismo lugar durante más de 30 minutos seguidos. Realice caminatas cortas en superficies planas tan pronto como le sea posible. Trate de caminar un poco más de tiempo cada día.  · Haga ejercicio regularmente como se lo haya indicado el médico. El ejercicio ayuda a que su espalda se cure más rápidamente. También ayuda a prevenir futuras lesiones al mantener los músculos fuertes y flexibles.  · No permanezca en la cama. Si hace reposo más de 1 a 2 días, puede demorar su recuperación.  · Preste atención a su cuerpo al inclinarse y levantarse. Las posiciones más cómodas son las que ejercen menos tensión en la espalda en recuperación. Siempre use técnicas apropiadas para levantar objetos, como por ejemplo:    Flexionar las rodillas.    Mantener la carga cerca del cuerpo.    No torcerse.  · Encuentre una posición cómoda para dormir. Use un colchón firme y recuéstese de costado con las rodillas ligeramente flexionadas. Si se recuesta sobre la espalda, coloque  una almohada debajo de las rodillas.  · Evite sentir ansiedad o estrés. El estrés aumenta la tensión muscular y puede empeorar el dolor de espalda. Es importante reconocer si se siente ansioso o estresado y aprender maneras de controlarlo, por ejemplo haciendo ejercicio.  · Tome los medicamentos solamente como se lo haya indicado el médico. Los medicamentos de venta libre para aliviar el dolor y la inflamación a menudo son los más eficaces. El médico puede recetarle relajantes musculares. Estos medicamentos ayudan a calmar el dolor de modo que pueda reanudar más rápidamente sus actividades normales y el ejercicio saludable.  · Aplique hielo sobre la zona lesionada.    Ponga el hielo en una bolsa plástica.    Coloque una toalla entre la piel y la bolsa de hielo.    Deje el hielo durante 20 minutos, 2 a 3 veces por día, durante los primeros 2 o 3 días. Después de eso, puede alternar el hielo y el calor para reducir el dolor y los espasmos.  · Mantenga un peso saludable. El exceso de peso ejerce presión adicional sobre la espalda y hace que resulte difícil mantener una buena postura.  SOLICITE ATENCIÓN MÉDICA SI:  · Siente un dolor que no se alivia con reposo o medicamentos.  · Siente mucho dolor que se extiende a las piernas o los glúteos.  · El dolor no mejora en una semana.  · Siente dolor por la noche.  · Pierde peso.  · Siente escalofríos o fiebre.  SOLICITE ATENCIÓN MÉDICA DE INMEDIATO SI:   ·   Tiene nuevos problemas para controlar la vejiga o los intestinos.  · Siente debilidad o adormecimiento inusuales en los brazos o en las piernas.  · Siente náuseas o vómitos.  · Siente dolor abdominal.  · Siente que va a desmayarse.     Esta información no tiene como fin reemplazar el consejo del médico. Asegúrese de hacerle al médico cualquier pregunta que tenga.     Document Released: 08/18/2005 Document Revised: 09/08/2014  [employer]vier Interactive Patient Education ©2016 [employer]

## 2015-08-23 ENCOUNTER — Ambulatory Visit: Payer: No Typology Code available for payment source | Attending: Internal Medicine | Admitting: Internal Medicine

## 2015-08-23 ENCOUNTER — Encounter: Payer: Self-pay | Admitting: Internal Medicine

## 2015-08-23 VITALS — BP 122/74 | HR 62 | Temp 98.0°F | Resp 17 | Ht 66.0 in | Wt 183.2 lb

## 2015-08-23 DIAGNOSIS — M545 Low back pain, unspecified: Secondary | ICD-10-CM

## 2015-08-23 DIAGNOSIS — I10 Essential (primary) hypertension: Secondary | ICD-10-CM

## 2015-08-23 DIAGNOSIS — Z79899 Other long term (current) drug therapy: Secondary | ICD-10-CM | POA: Insufficient documentation

## 2015-08-23 DIAGNOSIS — E785 Hyperlipidemia, unspecified: Secondary | ICD-10-CM

## 2015-08-23 DIAGNOSIS — K219 Gastro-esophageal reflux disease without esophagitis: Secondary | ICD-10-CM

## 2015-08-23 DIAGNOSIS — E119 Type 2 diabetes mellitus without complications: Secondary | ICD-10-CM

## 2015-08-23 LAB — GLUCOSE, POCT (MANUAL RESULT ENTRY): POC Glucose: 147 mg/dl — AB (ref 70–99)

## 2015-08-23 LAB — POCT GLYCOSYLATED HEMOGLOBIN (HGB A1C): Hemoglobin A1C: 6.6

## 2015-08-23 LAB — MICROALBUMIN, URINE: Microalb, Ur: 0.5 mg/dL

## 2015-08-23 MED ORDER — OMEPRAZOLE 40 MG PO CPDR: 40.0000 mg | DELAYED_RELEASE_CAPSULE | Freq: Every day | ORAL | Status: DC

## 2015-08-23 MED ORDER — IBUPROFEN 800 MG PO TABS: 800.0000 mg | ORAL_TABLET | Freq: Three times a day (TID) | ORAL | Status: DC | PRN

## 2015-08-23 MED ORDER — GLIMEPIRIDE 2 MG PO TABS: 2.0000 mg | ORAL_TABLET | Freq: Every day | ORAL | Status: DC

## 2015-08-23 MED ORDER — CALCIUM CARBONATE 1250 (500 CA) MG PO TABS: 1.0000 | ORAL_TABLET | Freq: Every day | ORAL | Status: DC

## 2015-08-23 MED ORDER — DICLOFENAC SODIUM 1 % TD GEL: 2.0000 g | Freq: Four times a day (QID) | TRANSDERMAL | Status: DC

## 2015-08-23 MED ORDER — LISINOPRIL 5 MG PO TABS: 5.0000 mg | ORAL_TABLET | Freq: Every day | ORAL | Status: DC

## 2015-08-23 MED ORDER — ROSUVASTATIN CALCIUM 10 MG PO TABS: 10.0000 mg | ORAL_TABLET | Freq: Every day | ORAL | Status: DC

## 2015-08-23 NOTE — Progress Notes (Signed)
Patient ID: Michael Campos, male   DOB: May 18, 1951, 64 y.o.   MRN: [id number]  CC: urgent care follow up  HPI: Michael Campos is a 64 y.o. male here today for a follow up visit.  Patient has past medical history of diabetes, HTN, HLD. Patient reports that she was evaluated at the urgent care 12 days ago for chronic lower back pain. He received xrays at that time that showed mild degenerative disease in the thoracic and lumbar regions. He has been taking diclofenac, flexeril, and Tramadol for pain without relief. He is requesting a referral to orthopedics today. Pain is described as stabbing and does not radiate. Bending and twisting aggravates the pain. He denies bowel or bladder dysfunction. He states that the pain is worse and he has a heavy sensation in his back when he attempted to get out of bed this morning.   Patient is takes his diabetes medication daily with complications. He follows a ADA diet and checks his sugars sporadically. Last colonoscopy was 11/2013 and had polyps so he will need a repeat in 5 years.    No Known Allergies Past Medical History  Diagnosis Date  . Diabetes mellitus   . Hypertension   . Hyperlipidemia   . PONV (postoperative nausea and vomiting)    Current Outpatient Prescriptions on File Prior to Visit  Medication Sig Dispense Refill  . ibuprofen (ADVIL,MOTRIN) 800 MG tablet TAKE 1 TABLET BY MOUTH EVERY 8 HOURS AS NEEDED FOR PAIN 65 tablet 1  . Multiple Vitamins-Minerals (MULTIVITAMIN ADULT PO) Take 1 tablet by mouth daily.    . cyclobenzaprine (FLEXERIL) 10 MG tablet Take 1 tablet (10 mg total) by mouth 2 (two) times daily as needed for muscle spasms. 20 tablet 0  . traMADol (ULTRAM) 50 MG tablet Take 1 tablet (50 mg total) by mouth every 6 (six) hours as needed. 15 tablet 0  . vitamin B-12 (CYANOCOBALAMIN) 1000 MCG tablet Take 1,000 mcg by mouth daily.     No current facility-administered medications on file prior to visit.   History  reviewed. No pertinent family history. Social History   Social History  . Marital Status: Married    Spouse Name: N/A  . Number of Children: N/A  . Years of Education: N/A   Occupational History  . Not on file.   Social History Main Topics  . Smoking status: Never Smoker   . Smokeless tobacco: Never Used  . Alcohol Use: No  . Drug Use: No  . Sexual Activity: Not on file   Other Topics Concern  . Not on file   Social History Narrative    Review of Systems: Other than what is stated in HPI, all other systems are negative.   Objective:   Filed Vitals:   08/23/15 0954  BP: 122/74  Pulse: 62  Temp: 98 F (36.7 C)  Resp: 17    Physical Exam  Constitutional: He is oriented to person, place, and time.  Cardiovascular: Normal rate, regular rhythm and normal heart sounds.   Pulmonary/Chest: Effort normal and breath sounds normal.  Musculoskeletal: He exhibits no edema or tenderness.  Neurological: He is alert and oriented to person, place, and time.  Skin: Skin is warm and dry.  Psychiatric: He has a normal mood and affect.     Lab Results  Component Value Date   WBC 6.8 01/11/2015   HGB 14.3 01/11/2015   HCT 42.6 01/11/2015   MCV 90.1 01/11/2015   PLT 381 01/11/2015   Lab Results  Component Value Date   CREATININE 0.62 01/11/2015   BUN 10 01/11/2015   NA 141 01/11/2015   K 4.1 01/11/2015   CL 105 01/11/2015   CO2 25 01/11/2015    Lab Results  Component Value Date   HGBA1C 6.60 08/23/2015   Lipid Panel     Component Value Date/Time   CHOL 151 05/15/2014 0913   TRIG 165* 05/15/2014 0913   HDL 50 05/15/2014 0913   CHOLHDL 3.0 05/15/2014 0913   VLDL 33 05/15/2014 0913   LDLCALC 68 05/15/2014 0913       Assessment and plan:   [name] was seen today for follow-up.  Diagnoses and all orders for this visit:  Midline low back pain without sciatica -     diclofenac sodium (VOLTAREN) 1 % GEL; Apply 2 g topically 4 (four) times daily. -      Ambulatory referral to Orthopedic Surgery  Type 2 diabetes mellitus without complication, without long-term current use of insulin (HCC) -     Glucose (CBG) -     HgB A1c -     glimepiride (AMARYL) 2 MG tablet; Take 1 tablet (2 mg total) by mouth daily before breakfast. -     Flu Vaccine QUAD 36+ mos PF IM (Fluarix & Fluzone Quad PF) -     Cancel: Microalbumin, urine, 24 hour; Future -     Microalbumin, urine -     Basic Metabolic Panel; Future Patients diabetes is well control as evidence by consistently low a1c.  Patient will continue with current therapy and continue to make necessary lifestyle changes.  Reviewed foot care, diet, exercise, annual health maintenance with patient.   Essential hypertension -     lisinopril (PRINIVIL,ZESTRIL) 5 MG tablet; Take 1 tablet (5 mg total) by mouth daily. Patient blood pressure is stable and may continue on current medication.  Education on diet, exercise, and modifiable risk factors discussed. Will obtain appropriate labs as needed. Will follow up in 3-6 months.   Dyslipidemia -     rosuvastatin (CRESTOR) 10 MG tablet; Take 1 tablet (10 mg total) by mouth daily. -     Lipid panel; Future Education provided on proper lifestyle changes in order to lower cholesterol. Patient advised to maintain healthy weight and to keep total fat intake at 25-35% of total calories and carbohydrates 50-60% of total daily calories. Explained how high cholesterol places patient at risk for heart disease. Patient placed on appropriate medication and repeat labs in 6 months   Gastroesophageal reflux disease, esophagitis presence not specified -     omeprazole (PRILOSEC) 40 MG capsule; Take 1 capsule (40 mg total) by mouth daily. Stable, meds refilled    Return in about 1 day (around 08/24/2015) for Lab Visit and 6 mo DM.        Lance Bosch, [facility] [phone] 08/23/2015, 10:22 AM

## 2015-08-23 NOTE — Progress Notes (Signed)
Patient here for follow up from the urgent care for his chronic back pain Patient was given vicodin for his pain Patient will need referral to ortho Patient also requesting refills on his medicatons

## 2015-08-24 ENCOUNTER — Ambulatory Visit: Payer: No Typology Code available for payment source | Attending: Internal Medicine

## 2015-08-24 DIAGNOSIS — E785 Hyperlipidemia, unspecified: Secondary | ICD-10-CM

## 2015-08-24 DIAGNOSIS — E119 Type 2 diabetes mellitus without complications: Secondary | ICD-10-CM

## 2015-08-24 LAB — LIPID PANEL
Cholesterol: 162 mg/dL (ref 125–200)
HDL: 50 mg/dL (ref >=40)
LDL Cholesterol: 65 mg/dL (ref <130)
Total CHOL/HDL Ratio: 3.2 Ratio (ref <=5.0)
Triglycerides: 237 mg/dL — ABNORMAL HIGH (ref <150)
VLDL: 47 mg/dL — AB (ref <30)

## 2015-08-24 LAB — BASIC METABOLIC PANEL
BUN: 17 mg/dL (ref 7–25)
CALCIUM: 9.7 mg/dL (ref 8.6–10.3)
CO2: 28 mmol/L (ref 20–31)
Chloride: 104 mmol/L (ref 98–110)
Creat: 0.71 mg/dL (ref 0.70–1.25)
GLUCOSE: 104 mg/dL — AB (ref 65–99)
Potassium: 4.5 mmol/L (ref 3.5–5.3)
SODIUM: 140 mmol/L (ref 135–146)

## 2015-08-28 ENCOUNTER — Telehealth: Payer: Self-pay

## 2015-08-28 NOTE — Telephone Encounter (Signed)
Interpreter line used Valarie Merino ID# [id number] Tried to call patient this am Patient not available Message left on voice mail to return our call

## 2015-08-28 NOTE — Telephone Encounter (Signed)
-----   Message from Lance Bosch, NP sent at 08/28/2015 10:53 AM EST ----- Have patient to get some OTC Fish oil tablets and take twice per day because his triglycerides are elevated. Fish oil works great on triglycerides.

## 2015-09-25 MED FILL — ?GLIMEPIRIDE 2 MG TABLET: 2 | 30 days supply | Qty: 30 | Fill #1

## 2015-09-25 MED FILL — VOLTAREN 1% GEL: 1 | 30 days supply | Qty: 100 | Fill #1

## 2015-09-25 MED FILL — ROSUVASTATIN CAL 10 MG TAB: 10 | 30 days supply | Qty: 30 | Fill #1

## 2015-09-25 MED FILL — LISINOPRIL 5 MG TABLET: 5 | 30 days supply | Qty: 30 | Fill #1

## 2015-09-25 MED FILL — OMEPRAZOLE DR 40 MG CAPSULE: 40 | 30 days supply | Qty: 30 | Fill #1

## 2015-10-23 MED FILL — ?GLIMEPIRIDE 2 MG TABLET: 2 | 30 days supply | Qty: 30 | Fill #2

## 2015-10-23 MED FILL — VOLTAREN 1% GEL: 1 | 30 days supply | Qty: 100 | Fill #2

## 2015-10-23 MED FILL — OMEPRAZOLE DR 40 MG CAPSULE: 40 | 30 days supply | Qty: 30 | Fill #2

## 2015-10-23 MED FILL — LISINOPRIL 5 MG TABLET: 5 | 30 days supply | Qty: 30 | Fill #2

## 2015-10-25 MED FILL — ROSUVASTATIN CAL 10 MG TAB: 10 | 30 days supply | Qty: 30 | Fill #2

## 2015-11-19 MED FILL — LISINOPRIL 5 MG TABLET: 5 | 30 days supply | Qty: 30 | Fill #3

## 2015-11-19 MED FILL — ?GLIMEPIRIDE 2 MG TABLET: 2 | 30 days supply | Qty: 30 | Fill #3

## 2015-11-19 MED FILL — ROSUVASTATIN CAL 10 MG TAB: 10 | 30 days supply | Qty: 30 | Fill #3

## 2015-11-19 MED FILL — OMEPRAZOLE DR 40 MG CAPSULE: 40 | 30 days supply | Qty: 30 | Fill #3

## 2015-11-19 MED FILL — VOLTAREN 1% GEL: 1 | 30 days supply | Qty: 100 | Fill #3

## 2015-11-20 ENCOUNTER — Ambulatory Visit: Payer: Self-pay | Attending: Internal Medicine

## 2015-12-06 ENCOUNTER — Other Ambulatory Visit: Payer: Self-pay | Admitting: Internal Medicine

## 2015-12-07 MED FILL — FLUTICASONE PROP 50 MCG SPR: 50 | 30 days supply | Qty: 16 | Fill #0

## 2015-12-20 MED FILL — LISINOPRIL 5 MG TABLET: 5 | 30 days supply | Qty: 30 | Fill #4

## 2015-12-20 MED FILL — ?GLIMEPIRIDE 2 MG TABLET: 2 | 30 days supply | Qty: 30 | Fill #4

## 2015-12-20 MED FILL — ROSUVASTATIN CAL 10 MG TAB: 10 | 30 days supply | Qty: 30 | Fill #4

## 2015-12-20 MED FILL — OMEPRAZOLE DR 40 MG CAPSULE: 40 | 30 days supply | Qty: 30 | Fill #4

## 2015-12-20 MED FILL — VOLTAREN 1% GEL: 1 | 30 days supply | Qty: 100 | Fill #4

## 2016-01-17 ENCOUNTER — Other Ambulatory Visit: Payer: Self-pay | Admitting: Internal Medicine

## 2016-01-17 MED FILL — LISINOPRIL 5 MG TABLET: 5 | 30 days supply | Qty: 30 | Fill #5

## 2016-01-17 MED FILL — GLIMEPIRIDE 2 MG TABLET: 2 | 30 days supply | Qty: 30 | Fill #5

## 2016-01-17 MED FILL — OMEPRAZOLE DR 40 MG CAPSULE: 40 | 30 days supply | Qty: 30 | Fill #0

## 2016-01-17 MED FILL — VOLTAREN 1% GEL: 1 | 30 days supply | Qty: 100 | Fill #5

## 2016-01-17 MED FILL — ROSUVASTATIN CAL 10 MG TAB: 10 | 30 days supply | Qty: 30 | Fill #5

## 2016-01-25 ENCOUNTER — Encounter: Payer: Self-pay | Admitting: Family Medicine

## 2016-01-25 ENCOUNTER — Ambulatory Visit: Payer: Self-pay | Attending: Family Medicine | Admitting: Family Medicine

## 2016-01-25 VITALS — BP 127/78 | HR 68 | Temp 98.1°F | Resp 12 | Ht 66.5 in | Wt 178.2 lb

## 2016-01-25 DIAGNOSIS — K219 Gastro-esophageal reflux disease without esophagitis: Secondary | ICD-10-CM | POA: Insufficient documentation

## 2016-01-25 DIAGNOSIS — Z79899 Other long term (current) drug therapy: Secondary | ICD-10-CM | POA: Insufficient documentation

## 2016-01-25 DIAGNOSIS — I10 Essential (primary) hypertension: Secondary | ICD-10-CM | POA: Insufficient documentation

## 2016-01-25 DIAGNOSIS — M25551 Pain in right hip: Secondary | ICD-10-CM | POA: Insufficient documentation

## 2016-01-25 DIAGNOSIS — E119 Type 2 diabetes mellitus without complications: Secondary | ICD-10-CM | POA: Insufficient documentation

## 2016-01-25 DIAGNOSIS — E785 Hyperlipidemia, unspecified: Secondary | ICD-10-CM | POA: Insufficient documentation

## 2016-01-25 LAB — GLUCOSE, POCT (MANUAL RESULT ENTRY): POC GLUCOSE: 119 mg/dL — AB (ref 70–99)

## 2016-01-25 LAB — POCT GLYCOSYLATED HEMOGLOBIN (HGB A1C): Hemoglobin A1C: 6.9

## 2016-01-25 MED ORDER — IBUPROFEN 600 MG PO TABS: 600.0000 mg | ORAL_TABLET | Freq: Two times a day (BID) | ORAL | Status: DC | PRN

## 2016-01-25 MED ORDER — LISINOPRIL 5 MG PO TABS: 5.0000 mg | ORAL_TABLET | Freq: Every day | ORAL | Status: DC

## 2016-01-25 MED ORDER — GLIMEPIRIDE 2 MG PO TABS: 2.0000 mg | ORAL_TABLET | Freq: Every day | ORAL | Status: DC

## 2016-01-25 MED ORDER — OMEPRAZOLE 40 MG PO CPDR: 40.0000 mg | DELAYED_RELEASE_CAPSULE | Freq: Every day | ORAL | Status: DC

## 2016-01-25 MED ORDER — ROSUVASTATIN CALCIUM 10 MG PO TABS: 10.0000 mg | ORAL_TABLET | Freq: Every day | ORAL | Status: DC

## 2016-01-25 NOTE — Progress Notes (Signed)
Subjective:  Patient ID: Michael Campos, male    DOB: 1951-04-14  Age: 65 y.o. MRN: [id number]  CC: Hip Pain   HPI Michael Campos is a 64 year old male with a history of hypertension, type 2 diabetes mellitus (A1c 6.9 from today), hyperlipidemia, GERD who comes into the clinic for a follow-up visit and to establish care with me.  He has been compliant with his medications and a diabetic as well as low-sodium and low-cholesterol diet. Blood sugars have been controlled with fasting in the 100s to 120s and random less than 200. He denies any numbness in his extremities. He denies any reflux symptoms- states he has optimal control on his PPI.  Complains of a two-month history of right hip pain ever since he took a fall and hit his right hip. Maximum pain level is 6/10 when he sleeps on the right side and so he often sleeps on the left. Pain does not radiate and is alleviated by taking ibuprofen. He had a lower back imaging in 08/2015 which revealed degenerative disc disease of the lumbar spine; he denies back pain at this time.  Outpatient Prescriptions Prior to Visit  Medication Sig Dispense Refill  . calcium carbonate (OS-CAL - DOSED IN MG OF ELEMENTAL CALCIUM) 1250 (500 CA) MG tablet Take 1 tablet (500 mg of elemental calcium total) by mouth daily with breakfast. 30 tablet 2  . diclofenac sodium (VOLTAREN) 1 % GEL Apply 2 g topically 4 (four) times daily. 2 Tube 12  . Multiple Vitamins-Minerals (MULTIVITAMIN ADULT PO) Take 1 tablet by mouth daily.    . vitamin B-12 (CYANOCOBALAMIN) 1000 MCG tablet Take 1,000 mcg by mouth daily.    Marland Kitchen glimepiride (AMARYL) 2 MG tablet Take 1 tablet (2 mg total) by mouth daily before breakfast. 30 tablet 6  . ibuprofen (ADVIL,MOTRIN) 800 MG tablet TAKE 1 TABLET BY MOUTH EVERY 8 HOURS AS NEEDED FOR PAIN 65 tablet 1  . ibuprofen (ADVIL,MOTRIN) 800 MG tablet Take 1 tablet (800 mg total) by mouth every 8 (eight) hours as needed. 30 tablet 0  .  lisinopril (PRINIVIL,ZESTRIL) 5 MG tablet Take 1 tablet (5 mg total) by mouth daily. 30 tablet 6  . omeprazole (PRILOSEC) 40 MG capsule TAKE 1 CAPSULE BY MOUTH DAILY. 30 capsule 2  . rosuvastatin (CRESTOR) 10 MG tablet Take 1 tablet (10 mg total) by mouth daily. 90 tablet 2  . cyclobenzaprine (FLEXERIL) 10 MG tablet Take 1 tablet (10 mg total) by mouth 2 (two) times daily as needed for muscle spasms. (Patient not taking: Reported on 01/25/2016) 20 tablet 0  . traMADol (ULTRAM) 50 MG tablet Take 1 tablet (50 mg total) by mouth every 6 (six) hours as needed. (Patient not taking: Reported on 01/25/2016) 15 tablet 0  . fluticasone (FLONASE) 50 MCG/ACT nasal spray PLACE 2 SPRAYS INTO BOTH NOSTRILS DAILY. (Patient not taking: Reported on 01/25/2016) 16 g 2   No facility-administered medications prior to visit.    ROS Review of Systems  Constitutional: Negative for activity change and appetite change.  HENT: Negative for sinus pressure and sore throat.   Eyes: Negative for visual disturbance.  Respiratory: Negative for cough, chest tightness and shortness of breath.   Cardiovascular: Negative for chest pain and leg swelling.  Gastrointestinal: Negative for abdominal pain, diarrhea, constipation and abdominal distention.  Endocrine: Negative.   Genitourinary: Negative for dysuria.  Musculoskeletal: Positive for myalgias.       See hpi  Skin: Negative for rash.  Allergic/Immunologic: Negative.  Neurological: Negative for weakness, light-headedness and numbness.  Psychiatric/Behavioral: Negative for suicidal ideas and dysphoric mood.    Objective:  BP 127/78 mmHg  Pulse 68  Temp(Src) 98.1 F (36.7 C) (Oral)  Resp 12  Ht 5' 6.5" (1.689 m)  Wt 178 lb 3.2 oz (80.831 kg)  BMI 28.33 kg/m2  SpO2 94%  BP/Weight 01/25/2016 08/23/2015 [id number]  Systolic BP [id number]  Diastolic BP 78 74 72  Wt. (Lbs) 178.2 183.2 -  BMI 28.33 29.58 -      Physical Exam  Constitutional: He is oriented to  person, place, and time. He appears well-developed and well-nourished.  Cardiovascular: Normal rate, normal heart sounds and intact distal pulses.   No murmur heard. Pulmonary/Chest: Effort normal and breath sounds normal. He has no wheezes. He has no rales. He exhibits no tenderness.  Abdominal: Soft. Bowel sounds are normal. He exhibits no distension and no mass. There is no tenderness.  Musculoskeletal: He exhibits tenderness (tenderness on palpation of right hip but no tenderness on range of motion).  Neurological: He is alert and oriented to person, place, and time.  Skin: Skin is warm and dry.  Psychiatric: He has a normal mood and affect.   Lab Results  Component Value Date   HGBA1C 6.9 01/25/2016    Lipid Panel     Component Value Date/Time   CHOL 162 08/24/2015 0924   TRIG 237* 08/24/2015 0924   HDL 50 08/24/2015 0924   CHOLHDL 3.2 08/24/2015 0924   VLDL 47* 08/24/2015 0924   LDLCALC 65 08/24/2015 0924      Assessment & Plan:   1. Gastroesophageal reflux disease without esophagitis Controlled Continue PPI  2. Type 2 diabetes mellitus without complication, without long-term current use of insulin (HCC) Controlled with A1c of 6.9 ADA diet - HgB A1c - Glucose (CBG) - glimepiride (AMARYL) 2 MG tablet; Take 1 tablet (2 mg total) by mouth daily before breakfast.  Dispense: 30 tablet; Refill: 3 - COMPLETE METABOLIC PANEL WITH GFR; Future - Microalbumin / creatinine urine ratio  3. Essential hypertension Controlled - lisinopril (PRINIVIL,ZESTRIL) 5 MG tablet; Take 1 tablet (5 mg total) by mouth daily.  Dispense: 30 tablet; Refill: 3  4. Dyslipidemia Lipid panel ordered Low-cholesterol diet - rosuvastatin (CRESTOR) 10 MG tablet; Take 1 tablet (10 mg total) by mouth daily.  Dispense: 30 tablet; Refill: 3 - Lipid panel; Future  5. Right hip pain Secondary to trauma 2 months ago Continue ibuprofen If symptoms persist at next visit will order imaging - ibuprofen  (ADVIL,MOTRIN) 600 MG tablet; Take 1 tablet (600 mg total) by mouth every 12 (twelve) hours as needed.  Dispense: 60 tablet; Refill: 1   Meds ordered this encounter  Medications  . glimepiride (AMARYL) 2 MG tablet    Sig: Take 1 tablet (2 mg total) by mouth daily before breakfast.    Dispense:  30 tablet    Refill:  3  . lisinopril (PRINIVIL,ZESTRIL) 5 MG tablet    Sig: Take 1 tablet (5 mg total) by mouth daily.    Dispense:  30 tablet    Refill:  3  . omeprazole (PRILOSEC) 40 MG capsule    Sig: Take 1 capsule (40 mg total) by mouth daily.    Dispense:  30 capsule    Refill:  3  . rosuvastatin (CRESTOR) 10 MG tablet    Sig: Take 1 tablet (10 mg total) by mouth daily.    Dispense:  30 tablet    Refill:  3  . ibuprofen (ADVIL,MOTRIN) 600 MG tablet    Sig: Take 1 tablet (600 mg total) by mouth every 12 (twelve) hours as needed.    Dispense:  60 tablet    Refill:  1    Follow-up: Return in about 1 month (around 02/25/2016) for follow up of right hip pain.   Arnoldo Morale MD

## 2016-01-25 NOTE — Progress Notes (Signed)
Pt here for right hip pain rated at a 4. Pain is worse at night. Pain comes and go and has been present for 8 months. The pain has gotten worse since a fall 2 months ago. Pt CBG is 119 and A1C 6.9. Pt has taken his morning medications today and needs refills on all medications.

## 2016-01-25 NOTE — Patient Instructions (Signed)
Diabetes Mellitus and Food It is important for you to manage your blood sugar (glucose) level. Your blood glucose level can be greatly affected by what you eat. Eating healthier foods in the appropriate amounts throughout the day at about the same time each day will help you control your blood glucose level. It can also help slow or prevent worsening of your diabetes mellitus. Healthy eating may even help you improve the level of your blood pressure and reach or maintain a healthy weight.  General recommendations for healthful eating and cooking habits include:  Eating meals and snacks regularly. Avoid going long periods of time without eating to lose weight.  Eating a diet that consists mainly of plant-based foods, such as fruits, vegetables, nuts, legumes, and whole grains.  Using low-heat cooking methods, such as baking, instead of high-heat cooking methods, such as deep frying. Work with your dietitian to make sure you understand how to use the Nutrition Facts information on food labels. HOW CAN FOOD AFFECT ME? Carbohydrates Carbohydrates affect your blood glucose level more than any other type of food. Your dietitian will help you determine how many carbohydrates to eat at each meal and teach you how to count carbohydrates. Counting carbohydrates is important to keep your blood glucose at a healthy level, especially if you are using insulin or taking certain medicines for diabetes mellitus. Alcohol Alcohol can cause sudden decreases in blood glucose (hypoglycemia), especially if you use insulin or take certain medicines for diabetes mellitus. Hypoglycemia can be a life-threatening condition. Symptoms of hypoglycemia (sleepiness, dizziness, and disorientation) are similar to symptoms of having too much alcohol.  If your health care provider has given you approval to drink alcohol, do so in moderation and use the following guidelines:  Women should not have more than one drink per day, and men  should not have more than two drinks per day. One drink is equal to:  12 oz of beer.  5 oz of wine.  1 oz of hard liquor.  Do not drink on an empty stomach.  Keep yourself hydrated. Have water, diet soda, or unsweetened iced tea.  Regular soda, juice, and other mixers might contain a lot of carbohydrates and should be counted. WHAT FOODS ARE NOT RECOMMENDED? As you make food choices, it is important to remember that all foods are not the same. Some foods have fewer nutrients per serving than other foods, even though they might have the same number of calories or carbohydrates. It is difficult to get your body what it needs when you eat foods with fewer nutrients. Examples of foods that you should avoid that are high in calories and carbohydrates but low in nutrients include:  Trans fats (most processed foods list trans fats on the Nutrition Facts label).  Regular soda.  Juice.  Candy.  Sweets, such as cake, pie, doughnuts, and cookies.  Fried foods. WHAT FOODS CAN I EAT? Eat nutrient-rich foods, which will nourish your body and keep you healthy. The food you should eat also will depend on several factors, including:  The calories you need.  The medicines you take.  Your weight.  Your blood glucose level.  Your blood pressure level.  Your cholesterol level. You should eat a variety of foods, including:  Protein.  Lean cuts of meat.  Proteins low in saturated fats, such as fish, egg whites, and beans. Avoid processed meats.  Fruits and vegetables.  Fruits and vegetables that may help control blood glucose levels, such as apples, mangoes, and   yams.  Dairy products.  Choose fat-free or low-fat dairy products, such as milk, yogurt, and cheese.  Grains, bread, pasta, and rice.  Choose whole grain products, such as multigrain bread, whole oats, and brown rice. These foods may help control blood pressure.  Fats.  Foods containing healthful fats, such as nuts,  avocado, olive oil, canola oil, and fish. DOES EVERYONE WITH DIABETES MELLITUS HAVE THE SAME MEAL PLAN? Because every person with diabetes mellitus is different, there is not one meal plan that works for everyone. It is very important that you meet with a dietitian who will help you create a meal plan that is just right for you.   This information is not intended to replace advice given to you by your health care provider. Make sure you discuss any questions you have with your health care provider.   Document Released: 05/15/2005 Document Revised: 09/08/2014 Document Reviewed: 07/15/2013 Elsevier Interactive Patient Education 2016 [employer] Inc[employer]  

## 2016-01-26 LAB — MICROALBUMIN / CREATININE URINE RATIO
Creatinine, Urine: 140 mg/dL (ref 20–370)
MICROALB/CREAT RATIO: 4 ug/mg{creat} (ref <30)
Microalb, Ur: 0.6 mg/dL

## 2016-01-30 ENCOUNTER — Other Ambulatory Visit: Payer: Self-pay

## 2016-01-31 ENCOUNTER — Ambulatory Visit: Payer: Self-pay | Attending: Family Medicine

## 2016-01-31 ENCOUNTER — Other Ambulatory Visit: Payer: Self-pay | Admitting: *Deleted

## 2016-01-31 DIAGNOSIS — E785 Hyperlipidemia, unspecified: Secondary | ICD-10-CM | POA: Insufficient documentation

## 2016-01-31 DIAGNOSIS — E119 Type 2 diabetes mellitus without complications: Secondary | ICD-10-CM | POA: Insufficient documentation

## 2016-01-31 NOTE — Patient Instructions (Signed)
Patient will receive a FU call with results

## 2016-02-01 MED FILL — IBUPROFEN 600 MG TABLET: 600 | 30 days supply | Qty: 60 | Fill #0

## 2016-02-18 MED FILL — OMEPRAZOLE DR 40 MG CAPSULE: 40 | 30 days supply | Qty: 30 | Fill #0

## 2016-02-18 MED FILL — GLIMEPIRIDE 2 MG TABLET: 2 | 30 days supply | Qty: 30 | Fill #0

## 2016-02-18 MED FILL — VOLTAREN 1% GEL: 1 | 30 days supply | Qty: 100 | Fill #6

## 2016-02-18 MED FILL — ROSUVASTATIN CAL 10 MG TAB: 10 | 30 days supply | Qty: 30 | Fill #0

## 2016-02-18 MED FILL — ?LISINOPRIL 5 MG TABLET: 5 | 30 days supply | Qty: 30 | Fill #0

## 2016-02-25 ENCOUNTER — Ambulatory Visit: Payer: Self-pay | Admitting: Family Medicine

## 2016-03-17 MED FILL — ROSUVASTATIN CAL 10 MG TAB: 10 | 30 days supply | Qty: 30 | Fill #1

## 2016-03-17 MED FILL — GLIMEPIRIDE 2 MG TABLET: 2 | 30 days supply | Qty: 30 | Fill #1

## 2016-03-17 MED FILL — IBUPROFEN 600 MG TABLET: 600 | 30 days supply | Qty: 60 | Fill #1

## 2016-03-17 MED FILL — ?LISINOPRIL 5 MG TABLET: 5 | 30 days supply | Qty: 30 | Fill #1

## 2016-03-17 MED FILL — OMEPRAZOLE DR 40 MG CAPSULE: 40 | 30 days supply | Qty: 30 | Fill #1

## 2016-03-17 MED FILL — VOLTAREN 1% GEL: 1 | 30 days supply | Qty: 100 | Fill #7

## 2016-04-15 MED FILL — ?GLIMEPIRIDE 2 MG TABLET: 2 | 30 days supply | Qty: 30 | Fill #2

## 2016-04-15 MED FILL — ROSUVASTATIN CAL 10 MG TAB: 10 | 30 days supply | Qty: 30 | Fill #2

## 2016-04-15 MED FILL — VOLTAREN 1% GEL: 1 | 30 days supply | Qty: 100 | Fill #8

## 2016-04-15 MED FILL — OMEPRAZOLE DR 40 MG CAPSULE: 40 | 30 days supply | Qty: 30 | Fill #2

## 2016-04-15 MED FILL — ?LISINOPRIL 5 MG TABLET: 5 | 30 days supply | Qty: 30 | Fill #2

## 2016-05-13 ENCOUNTER — Ambulatory Visit: Payer: Self-pay | Attending: Family Medicine

## 2016-05-13 MED FILL — VOLTAREN 1% GEL: 1 | 30 days supply | Qty: 100 | Fill #9

## 2016-05-13 MED FILL — ROSUVASTATIN CAL 10 MG TAB: 10 | 30 days supply | Qty: 30 | Fill #3

## 2016-05-13 MED FILL — ?LISINOPRIL 5 MG TABLET: 5 | 30 days supply | Qty: 30 | Fill #3

## 2016-05-13 MED FILL — ?GLIMEPIRIDE 2 MG TABLET: 2 | 30 days supply | Qty: 30 | Fill #3

## 2016-05-13 MED FILL — OMEPRAZOLE DR 40 MG CAPSULE: 40 | 30 days supply | Qty: 30 | Fill #3

## 2016-06-02 ENCOUNTER — Encounter (HOSPITAL_COMMUNITY): Payer: Self-pay | Admitting: *Deleted

## 2016-06-02 ENCOUNTER — Ambulatory Visit (HOSPITAL_COMMUNITY)
Admission: EM | Admit: 2016-06-02 | Discharge: 2016-06-02 | Disposition: A | Discharge status: Home / Self Care | Payer: Self-pay | Attending: Internal Medicine | Admitting: Internal Medicine

## 2016-06-02 DIAGNOSIS — L237 Allergic contact dermatitis due to plants, except food: Secondary | ICD-10-CM

## 2016-06-02 MED ORDER — SULFAMETHOXAZOLE-TRIMETHOPRIM 800-160 MG PO TABS: 1.0000 | ORAL_TABLET | Freq: Two times a day (BID) | ORAL | 0 refills | Status: AC

## 2016-06-02 MED ORDER — PREDNISONE 50 MG PO TABS: 50.0000 mg | ORAL_TABLET | Freq: Every day | ORAL | 0 refills | Status: AC
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
## 2016-06-02 NOTE — Discharge Instructions (Addendum)
Rash seems most likely to be a slightly infected poison ivy type rash.  Prescriptions for trimethoprim/sulfa (antibiotic) and prednisone (for itching) were sent to the [facility] on [address].  Recheck or followup with primary care provider, Michael Campos, for increasing redness/pain/swelling/drainage or new fever >100.5, or if itching not starting to improve in a few days.  Anticipate gradual improvement in rash over the next week or two.

## 2016-06-02 NOTE — ED Triage Notes (Signed)
Patient works outside, noted rash to bilateral anterior aspects of arm and left side of chest since Friday. Reports extreme itching.

## 2016-06-11 ENCOUNTER — Encounter: Payer: Self-pay | Admitting: Internal Medicine

## 2016-06-17 MED FILL — DICLOFENAC SODIUM 1% GEL: 1 | 30 days supply | Qty: 100 | Fill #10

## 2016-06-17 MED FILL — LISINOPRIL 5 MG TABLET: 5 | 30 days supply | Qty: 30 | Fill #6

## 2016-06-17 MED FILL — GLIMEPIRIDE 2 MG TABLET: 2 | 30 days supply | Qty: 30 | Fill #6

## 2016-06-17 MED FILL — OMEPRAZOLE DR 40 MG CAPSULE: 40 | 30 days supply | Qty: 30 | Fill #1

## 2016-06-30 ENCOUNTER — Ambulatory Visit: Payer: Self-pay | Attending: Family Medicine | Admitting: Family Medicine

## 2016-06-30 ENCOUNTER — Ambulatory Visit: Payer: Self-pay | Admitting: Family Medicine

## 2016-06-30 ENCOUNTER — Encounter: Payer: Self-pay | Admitting: Family Medicine

## 2016-06-30 VITALS — BP 130/76 | HR 64 | Temp 98.0°F | Ht 66.0 in | Wt 181.8 lb

## 2016-06-30 DIAGNOSIS — M549 Dorsalgia, unspecified: Secondary | ICD-10-CM | POA: Insufficient documentation

## 2016-06-30 DIAGNOSIS — E785 Hyperlipidemia, unspecified: Secondary | ICD-10-CM

## 2016-06-30 DIAGNOSIS — Z79899 Other long term (current) drug therapy: Secondary | ICD-10-CM | POA: Insufficient documentation

## 2016-06-30 DIAGNOSIS — K219 Gastro-esophageal reflux disease without esophagitis: Secondary | ICD-10-CM

## 2016-06-30 DIAGNOSIS — Z9889 Other specified postprocedural states: Secondary | ICD-10-CM | POA: Insufficient documentation

## 2016-06-30 DIAGNOSIS — E119 Type 2 diabetes mellitus without complications: Secondary | ICD-10-CM

## 2016-06-30 DIAGNOSIS — I10 Essential (primary) hypertension: Secondary | ICD-10-CM

## 2016-06-30 DIAGNOSIS — E781 Pure hyperglyceridemia: Secondary | ICD-10-CM | POA: Insufficient documentation

## 2016-06-30 DIAGNOSIS — M25551 Pain in right hip: Secondary | ICD-10-CM | POA: Insufficient documentation

## 2016-06-30 DIAGNOSIS — Z23 Encounter for immunization: Secondary | ICD-10-CM

## 2016-06-30 LAB — GLUCOSE, POCT (MANUAL RESULT ENTRY): POC GLUCOSE: 143 mg/dL — AB (ref 70–99)

## 2016-06-30 LAB — POCT GLYCOSYLATED HEMOGLOBIN (HGB A1C): Hemoglobin A1C: 6.5

## 2016-06-30 MED ORDER — ROSUVASTATIN CALCIUM 10 MG PO TABS: 10.0000 mg | ORAL_TABLET | Freq: Every day | ORAL | 3 refills | Status: DC

## 2016-06-30 MED ORDER — LISINOPRIL 5 MG PO TABS: 5.0000 mg | ORAL_TABLET | Freq: Every day | ORAL | 3 refills | Status: DC

## 2016-06-30 MED ORDER — GLUCOSE BLOOD VI STRP: ORAL_STRIP | 12 refills | Status: DC

## 2016-06-30 MED ORDER — TRUE METRIX METER DEVI: 1.0000 | Freq: Three times a day (TID) | 0 refills | Status: AC

## 2016-06-30 MED ORDER — TRUEPLUS LANCETS 28G MISC: 1.0000 | Freq: Three times a day (TID) | 12 refills | Status: DC

## 2016-06-30 MED ORDER — OMEPRAZOLE 40 MG PO CPDR: 40.0000 mg | DELAYED_RELEASE_CAPSULE | Freq: Every day | ORAL | 3 refills | Status: DC

## 2016-06-30 MED ORDER — GLIMEPIRIDE 2 MG PO TABS
2.0000 mg | ORAL_TABLET | Freq: Every day | ORAL | 3 refills | Status: DC
Start: 2016-06-30 — End: 2016-09-02

## 2016-06-30 MED FILL — ROSUVASTATIN CALCIUM 10 MG: 10 | 30 days supply | Qty: 30 | Fill #0

## 2016-06-30 MED FILL — TRUE METRIX TEST STRIP: 30 days supply | Qty: 100 | Fill #0

## 2016-06-30 MED FILL — TRUEplus LANCETS 28G MISC: 30 days supply | Qty: 100 | Fill #0

## 2016-06-30 MED FILL — TRUE METRIX BLOOD GLUCOSE M: W/DEVICE | 1 days supply | Qty: 1 | Fill #0
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
## 2016-06-30 NOTE — Progress Notes (Signed)
Needs a new glucometer

## 2016-07-01 ENCOUNTER — Ambulatory Visit: Payer: Self-pay | Attending: Family Medicine

## 2016-07-01 DIAGNOSIS — E119 Type 2 diabetes mellitus without complications: Secondary | ICD-10-CM | POA: Insufficient documentation

## 2016-07-01 LAB — LIPID PANEL
CHOL/HDL RATIO: 3.8 ratio (ref <=5.0)
Cholesterol: 188 mg/dL (ref 125–200)
HDL: 50 mg/dL (ref >=40)
LDL CALC: 91 mg/dL (ref <130)
TRIGLYCERIDES: 234 mg/dL — AB (ref <150)
VLDL: 47 mg/dL — ABNORMAL HIGH (ref <30)

## 2016-07-01 LAB — COMPLETE METABOLIC PANEL WITH GFR
ALT: 25 U/L (ref 9–46)
AST: 21 U/L (ref 10–35)
Albumin: 4.3 g/dL (ref 3.6–5.1)
Alkaline Phosphatase: 83 U/L (ref 40–115)
BILIRUBIN TOTAL: 0.5 mg/dL (ref 0.2–1.2)
BUN: 13 mg/dL (ref 7–25)
CHLORIDE: 104 mmol/L (ref 98–110)
CO2: 27 mmol/L (ref 20–31)
Calcium: 9.7 mg/dL (ref 8.6–10.3)
Creat: 0.75 mg/dL (ref 0.70–1.25)
GLUCOSE: 118 mg/dL — AB (ref 65–99)
POTASSIUM: 4.9 mmol/L (ref 3.5–5.3)
SODIUM: 141 mmol/L (ref 135–146)
TOTAL PROTEIN: 6.7 g/dL (ref 6.1–8.1)

## 2016-07-01 NOTE — Progress Notes (Signed)
Patient here for lab visit only 

## 2016-07-17 MED FILL — GLIMEPIRIDE 2 MG TABLET: 2 | 30 days supply | Qty: 30 | Fill #0

## 2016-07-17 MED FILL — VOLTAREN 1% GEL: 1 | 30 days supply | Qty: 100 | Fill #11

## 2016-07-17 MED FILL — LISINOPRIL 5 MG TABLET: 5 | 30 days supply | Qty: 30 | Fill #0

## 2016-07-17 MED FILL — OMEPRAZOLE DR 40 MG CAPSULE: 40 | 30 days supply | Qty: 30 | Fill #0

## 2016-07-20 ENCOUNTER — Encounter (HOSPITAL_COMMUNITY): Payer: Self-pay | Admitting: Emergency Medicine

## 2016-07-20 ENCOUNTER — Ambulatory Visit (HOSPITAL_COMMUNITY)
Admission: EM | Admit: 2016-07-20 | Discharge: 2016-07-20 | Disposition: A | Discharge status: Home / Self Care | Payer: Self-pay | Attending: Emergency Medicine | Admitting: Emergency Medicine

## 2016-07-20 DIAGNOSIS — S93492A Sprain of other ligament of left ankle, initial encounter: Secondary | ICD-10-CM

## 2016-07-20 MED ORDER — TRAMADOL HCL 50 MG PO TABS: 50.0000 mg | ORAL_TABLET | Freq: Four times a day (QID) | ORAL | 0 refills | Status: DC | PRN
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
## 2016-07-20 NOTE — Discharge Instructions (Signed)
You have a bad sprain of your ankle. Wear the brace every day for the next week. Ice the ankle at least twice a day. Take ibuprofen as needed for pain. Use the tramadol for pain at night. You should see gradual improvement over the next week. If things are not getting better, come back for an x-ray.

## 2016-07-20 NOTE — ED Triage Notes (Signed)
Patient presents to [facility] today for Pain in his left foot. Patient states that the floor he was walking on was unlevel and he twisted his ankle.

## 2016-07-22 MED FILL — traMADol HCL 50 MG TABS: 50 | 3 days supply | Qty: 15 | Fill #0

## 2016-07-29 MED FILL — !CRESTOR 10 MG TABLET: 10 | 30 days supply | Qty: 30 | Fill #1

## 2016-08-14 MED FILL — VOLTAREN 1% GEL: 1 | 30 days supply | Qty: 100 | Fill #12

## 2016-08-14 MED FILL — LISINOPRIL 5 MG TABLET: 5 | 30 days supply | Qty: 30 | Fill #1

## 2016-08-14 MED FILL — GLIMEPIRIDE 2 MG TABLET: 2 | 30 days supply | Qty: 30 | Fill #1

## 2016-08-14 MED FILL — OMEPRAZOLE DR 40 MG CAPSULE: 40 | 30 days supply | Qty: 30 | Fill #1

## 2016-08-21 MED FILL — !CRESTOR 10 MG TABLET: 10 | 30 days supply | Qty: 30 | Fill #2

## 2016-08-21 MED FILL — TRUE METRIX TEST STRIP: 30 days supply | Qty: 100 | Fill #1

## 2016-09-02 ENCOUNTER — Encounter: Payer: Self-pay | Admitting: Family Medicine

## 2016-09-02 ENCOUNTER — Ambulatory Visit: Payer: Self-pay | Attending: Family Medicine | Admitting: Family Medicine

## 2016-09-02 VITALS — BP 123/81 | HR 70 | Temp 97.3°F | Ht 66.0 in | Wt 181.8 lb

## 2016-09-02 DIAGNOSIS — E785 Hyperlipidemia, unspecified: Secondary | ICD-10-CM

## 2016-09-02 DIAGNOSIS — J322 Chronic ethmoidal sinusitis: Secondary | ICD-10-CM | POA: Insufficient documentation

## 2016-09-02 DIAGNOSIS — E119 Type 2 diabetes mellitus without complications: Secondary | ICD-10-CM | POA: Insufficient documentation

## 2016-09-02 DIAGNOSIS — Z79899 Other long term (current) drug therapy: Secondary | ICD-10-CM | POA: Insufficient documentation

## 2016-09-02 DIAGNOSIS — Z1159 Encounter for screening for other viral diseases: Secondary | ICD-10-CM

## 2016-09-02 DIAGNOSIS — J0120 Acute ethmoidal sinusitis, unspecified: Secondary | ICD-10-CM

## 2016-09-02 DIAGNOSIS — I10 Essential (primary) hypertension: Secondary | ICD-10-CM | POA: Insufficient documentation

## 2016-09-02 DIAGNOSIS — Z5189 Encounter for other specified aftercare: Secondary | ICD-10-CM | POA: Insufficient documentation

## 2016-09-02 DIAGNOSIS — K219 Gastro-esophageal reflux disease without esophagitis: Secondary | ICD-10-CM | POA: Insufficient documentation

## 2016-09-02 DIAGNOSIS — Z9889 Other specified postprocedural states: Secondary | ICD-10-CM | POA: Insufficient documentation

## 2016-09-02 DIAGNOSIS — E0800 Diabetes mellitus due to underlying condition with hyperosmolarity without nonketotic hyperglycemic-hyperosmolar coma (NKHHC): Secondary | ICD-10-CM

## 2016-09-02 DIAGNOSIS — E870 Hyperosmolality and hypernatremia: Secondary | ICD-10-CM | POA: Insufficient documentation

## 2016-09-02 LAB — GLUCOSE, POCT (MANUAL RESULT ENTRY): POC Glucose: 124 mg/dl — AB (ref 70–99)

## 2016-09-02 LAB — HEPATITIS C ANTIBODY: HCV AB: NEGATIVE

## 2016-09-02 MED ORDER — LISINOPRIL 5 MG PO TABS: 5.0000 mg | ORAL_TABLET | Freq: Every day | ORAL | 3 refills | Status: DC

## 2016-09-02 MED ORDER — GLIMEPIRIDE 2 MG PO TABS: 2.0000 mg | ORAL_TABLET | Freq: Every day | ORAL | 3 refills | Status: DC

## 2016-09-02 MED ORDER — CETIRIZINE HCL 10 MG PO TABS: 10.0000 mg | ORAL_TABLET | Freq: Every day | ORAL | 1 refills | Status: DC

## 2016-09-02 MED ORDER — OMEPRAZOLE 40 MG PO CPDR: 40.0000 mg | DELAYED_RELEASE_CAPSULE | Freq: Every day | ORAL | 3 refills | Status: DC

## 2016-09-02 MED ORDER — AMOXICILLIN 500 MG PO CAPS: 500.0000 mg | ORAL_CAPSULE | Freq: Three times a day (TID) | ORAL | 0 refills | Status: DC

## 2016-09-02 MED ORDER — ROSUVASTATIN CALCIUM 10 MG PO TABS: 10.0000 mg | ORAL_TABLET | Freq: Every day | ORAL | 3 refills | Status: DC

## 2016-09-02 MED FILL — ?AMOXICILLIN 500 MG CAPSULE: 500 | 10 days supply | Qty: 30 | Fill #0

## 2016-09-02 NOTE — Progress Notes (Signed)
Med refills
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
## 2016-09-03 LAB — COMPLETE METABOLIC PANEL WITH GFR
ALBUMIN: 4.3 g/dL (ref 3.6–5.1)
ALK PHOS: 90 U/L (ref 40–115)
ALT: 33 U/L (ref 9–46)
AST: 30 U/L (ref 10–35)
BUN: 17 mg/dL (ref 7–25)
CALCIUM: 9.2 mg/dL (ref 8.6–10.3)
CO2: 30 mmol/L (ref 20–31)
Chloride: 102 mmol/L (ref 98–110)
Creat: 0.63 mg/dL — ABNORMAL LOW (ref 0.70–1.25)
Glucose, Bld: 142 mg/dL — ABNORMAL HIGH (ref 65–99)
POTASSIUM: 4.2 mmol/L (ref 3.5–5.3)
Sodium: 140 mmol/L (ref 135–146)
Total Bilirubin: 0.4 mg/dL (ref 0.2–1.2)
Total Protein: 7 g/dL (ref 6.1–8.1)

## 2016-09-04 ENCOUNTER — Telehealth: Payer: Self-pay

## 2016-09-04 NOTE — Telephone Encounter (Signed)
Writer called patient per Dr. Jarold Song through [facility] [employer] to discuss lab result.  LVM requesting patient to call back to discuss.

## 2016-09-04 NOTE — Telephone Encounter (Signed)
-----   Message from Arnoldo Morale, MD sent at 09/03/2016 10:20 AM EST ----- Please inform the patient that labs are normal. Thank you.

## 2016-09-10 ENCOUNTER — Other Ambulatory Visit: Payer: Self-pay | Admitting: Internal Medicine

## 2016-09-10 DIAGNOSIS — M545 Low back pain, unspecified: Secondary | ICD-10-CM

## 2016-09-10 MED FILL — VOLTAREN 1% GEL: 1 | 18 days supply | Qty: 100 | Fill #0

## 2016-09-10 MED FILL — OMEPRAZOLE 40 MG CPDR: 40 | 30 days supply | Qty: 30 | Fill #2

## 2016-09-10 MED FILL — LISINOPRIL 5 MG TABLET: 5 | 30 days supply | Qty: 30 | Fill #2

## 2016-09-10 MED FILL — GLIMEPIRIDE 2 MG TABLET: 2 | 30 days supply | Qty: 30 | Fill #2

## 2016-09-18 ENCOUNTER — Other Ambulatory Visit: Payer: Self-pay | Admitting: Family Medicine

## 2016-09-18 DIAGNOSIS — M25551 Pain in right hip: Secondary | ICD-10-CM

## 2016-09-18 MED FILL — CRESTOR 10 MG TABLET: 10 | 30 days supply | Qty: 30 | Fill #3

## 2016-09-19 MED FILL — IBUPROFEN 600 MG TABLET: 600 | 30 days supply | Qty: 60 | Fill #0

## 2016-10-08 MED FILL — LISINOPRIL 5 MG TABLET: 5 | 30 days supply | Qty: 30 | Fill #3

## 2016-10-08 MED FILL — OMEPRAZOLE 40 MG CPDR: 40 | 30 days supply | Qty: 30 | Fill #3

## 2016-10-08 MED FILL — GLIMEPIRIDE 2 MG TABLET: 2 | 30 days supply | Qty: 30 | Fill #3

## 2016-10-09 NOTE — Progress Notes (Signed)
After many attempts to try and reach patient by telephone, writer sent him a letter regarding his lab results.

## 2016-10-14 MED FILL — ROSUVASTATIN CALCIUM 10 MG: 10 | 30 days supply | Qty: 30 | Fill #0

## 2016-10-17 ENCOUNTER — Ambulatory Visit: Payer: Self-pay | Admitting: Family Medicine

## 2016-10-27 MED FILL — TRUEplus LANCETS 28G MISC: 30 days supply | Qty: 100 | Fill #1

## 2016-10-28 ENCOUNTER — Encounter: Payer: Self-pay | Admitting: Family Medicine

## 2016-10-28 ENCOUNTER — Ambulatory Visit: Payer: Self-pay | Attending: Family Medicine | Admitting: Family Medicine

## 2016-10-28 VITALS — BP 127/69 | HR 64 | Temp 98.3°F | Ht 66.0 in | Wt 182.6 lb

## 2016-10-28 DIAGNOSIS — K219 Gastro-esophageal reflux disease without esophagitis: Secondary | ICD-10-CM | POA: Insufficient documentation

## 2016-10-28 DIAGNOSIS — Z79899 Other long term (current) drug therapy: Secondary | ICD-10-CM | POA: Insufficient documentation

## 2016-10-28 DIAGNOSIS — E119 Type 2 diabetes mellitus without complications: Secondary | ICD-10-CM | POA: Insufficient documentation

## 2016-10-28 DIAGNOSIS — G8929 Other chronic pain: Secondary | ICD-10-CM | POA: Insufficient documentation

## 2016-10-28 DIAGNOSIS — Z7984 Long term (current) use of oral hypoglycemic drugs: Secondary | ICD-10-CM | POA: Insufficient documentation

## 2016-10-28 DIAGNOSIS — M25551 Pain in right hip: Secondary | ICD-10-CM | POA: Insufficient documentation

## 2016-10-28 DIAGNOSIS — I10 Essential (primary) hypertension: Secondary | ICD-10-CM | POA: Insufficient documentation

## 2016-10-28 DIAGNOSIS — E785 Hyperlipidemia, unspecified: Secondary | ICD-10-CM | POA: Insufficient documentation

## 2016-10-28 DIAGNOSIS — J302 Other seasonal allergic rhinitis: Secondary | ICD-10-CM

## 2016-10-28 DIAGNOSIS — B351 Tinea unguium: Secondary | ICD-10-CM | POA: Insufficient documentation

## 2016-10-28 DIAGNOSIS — Z0001 Encounter for general adult medical examination with abnormal findings: Secondary | ICD-10-CM | POA: Insufficient documentation

## 2016-10-28 DIAGNOSIS — M25511 Pain in right shoulder: Secondary | ICD-10-CM | POA: Insufficient documentation

## 2016-10-28 DIAGNOSIS — Z9889 Other specified postprocedural states: Secondary | ICD-10-CM | POA: Insufficient documentation

## 2016-10-28 DIAGNOSIS — J309 Allergic rhinitis, unspecified: Secondary | ICD-10-CM | POA: Insufficient documentation

## 2016-10-28 LAB — GLUCOSE, POCT (MANUAL RESULT ENTRY): POC Glucose: 197 mg/dl — AB (ref 70–99)

## 2016-10-28 LAB — POCT GLYCOSYLATED HEMOGLOBIN (HGB A1C): HEMOGLOBIN A1C: 6.5

## 2016-10-28 MED ORDER — LISINOPRIL 5 MG PO TABS: 5.0000 mg | ORAL_TABLET | Freq: Every day | ORAL | 3 refills | Status: DC

## 2016-10-28 MED ORDER — CICLOPIROX OLAMINE 0.77 % EX SUSP: CUTANEOUS | 1 refills | Status: DC

## 2016-10-28 MED ORDER — GLIMEPIRIDE 2 MG PO TABS: 2.0000 mg | ORAL_TABLET | Freq: Every day | ORAL | 3 refills | Status: DC

## 2016-10-28 MED ORDER — IBUPROFEN 600 MG PO TABS: 600.0000 mg | ORAL_TABLET | Freq: Two times a day (BID) | ORAL | 2 refills | Status: DC | PRN

## 2016-10-28 MED ORDER — OMEPRAZOLE 40 MG PO CPDR: 40.0000 mg | DELAYED_RELEASE_CAPSULE | Freq: Every day | ORAL | 3 refills | Status: DC

## 2016-10-28 MED ORDER — CETIRIZINE HCL 10 MG PO TABS: 10.0000 mg | ORAL_TABLET | Freq: Every day | ORAL | 1 refills | Status: DC

## 2016-10-28 MED ORDER — ROSUVASTATIN CALCIUM 10 MG PO TABS: 10.0000 mg | ORAL_TABLET | Freq: Every day | ORAL | 3 refills | Status: DC
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
## 2016-10-28 NOTE — Progress Notes (Signed)
Medication refills- glimepiride, ibuprofen,lisinopril,omeprazole- please send to our pharmacy

## 2016-10-29 MED FILL — ?CETIRIZINE HCL 10 MG TABLE: 10 | 30 days supply | Qty: 30 | Fill #0

## 2016-10-29 MED FILL — OMEPRAZOLE DR 40 MG CAPSULE: 40 | 30 days supply | Qty: 30 | Fill #0

## 2016-10-29 MED FILL — IBUPROFEN 600 MG TABLET: 600 | 30 days supply | Qty: 60 | Fill #0

## 2016-11-04 MED FILL — ROSUVASTATIN CALCIUM 10 MG: 10 | 30 days supply | Qty: 30 | Fill #1

## 2016-11-04 MED FILL — ?LISINOPRIL 5 MG TABLET: 5 | 30 days supply | Qty: 30 | Fill #0

## 2016-11-04 MED FILL — GLIMEPIRIDE 2 MG TABLET: 2 | 30 days supply | Qty: 30 | Fill #0

## 2016-11-11 ENCOUNTER — Other Ambulatory Visit: Payer: Self-pay | Admitting: Family Medicine

## 2016-11-11 MED ORDER — CICLOPIROX 0.77 % EX GEL: 1.0000 | Freq: Two times a day (BID) | CUTANEOUS | 1 refills | Status: DC

## 2016-11-11 MED FILL — CICLOPIROX 0.77% TOPICAL SU: 0.77 | 15 days supply | Qty: 60 | Fill #0

## 2016-11-14 ENCOUNTER — Ambulatory Visit: Payer: Self-pay | Attending: Family Medicine

## 2016-11-17 ENCOUNTER — Ambulatory Visit: Payer: Self-pay

## 2016-12-04 MED FILL — ROSUVASTATIN CALCIUM 10 MG: 10 | 30 days supply | Qty: 30 | Fill #2

## 2016-12-04 MED FILL — OMEPRAZOLE DR 40 MG CAPSULE: 40 | 30 days supply | Qty: 30 | Fill #1

## 2016-12-04 MED FILL — ?LISINOPRIL 5 MG TABLET: 5 | 30 days supply | Qty: 30 | Fill #1

## 2016-12-04 MED FILL — TRUE METRIX TEST STRIP: 30 days supply | Qty: 100 | Fill #2

## 2016-12-04 MED FILL — GLIMEPIRIDE 2 MG TABLET: 2 | 30 days supply | Qty: 30 | Fill #1

## 2016-12-31 MED FILL — TRUEplus LANCETS 28G MISC: 30 days supply | Qty: 100 | Fill #2

## 2016-12-31 MED FILL — GLIMEPIRIDE 2 MG TABLET: 2 | 30 days supply | Qty: 30 | Fill #2

## 2016-12-31 MED FILL — OMEPRAZOLE DR 40 MG CAPSULE: 40 | 30 days supply | Qty: 30 | Fill #2

## 2016-12-31 MED FILL — LISINOPRIL 5 MG TAB: 5 | 30 days supply | Qty: 30 | Fill #2

## 2016-12-31 MED FILL — ROSUVASTATIN CALCIUM 10 MG: 10 | 30 days supply | Qty: 30 | Fill #3

## 2017-01-06 ENCOUNTER — Ambulatory Visit (HOSPITAL_COMMUNITY)
Admission: EM | Admit: 2017-01-06 | Discharge: 2017-01-06 | Disposition: A | Discharge status: Home / Self Care | Payer: Self-pay | Attending: Internal Medicine | Admitting: Internal Medicine

## 2017-01-06 ENCOUNTER — Encounter (HOSPITAL_COMMUNITY): Payer: Self-pay | Admitting: Emergency Medicine

## 2017-01-06 DIAGNOSIS — E119 Type 2 diabetes mellitus without complications: Secondary | ICD-10-CM | POA: Insufficient documentation

## 2017-01-06 DIAGNOSIS — I10 Essential (primary) hypertension: Secondary | ICD-10-CM | POA: Insufficient documentation

## 2017-01-06 DIAGNOSIS — M62830 Muscle spasm of back: Secondary | ICD-10-CM | POA: Insufficient documentation

## 2017-01-06 DIAGNOSIS — Z9889 Other specified postprocedural states: Secondary | ICD-10-CM | POA: Insufficient documentation

## 2017-01-06 DIAGNOSIS — T148XXA Other injury of unspecified body region, initial encounter: Secondary | ICD-10-CM

## 2017-01-06 DIAGNOSIS — S29012A Strain of muscle and tendon of back wall of thorax, initial encounter: Secondary | ICD-10-CM | POA: Insufficient documentation

## 2017-01-06 DIAGNOSIS — X58XXXA Exposure to other specified factors, initial encounter: Secondary | ICD-10-CM | POA: Insufficient documentation

## 2017-01-06 DIAGNOSIS — Z79899 Other long term (current) drug therapy: Secondary | ICD-10-CM | POA: Insufficient documentation

## 2017-01-06 DIAGNOSIS — Z7984 Long term (current) use of oral hypoglycemic drugs: Secondary | ICD-10-CM | POA: Insufficient documentation

## 2017-01-06 DIAGNOSIS — E785 Hyperlipidemia, unspecified: Secondary | ICD-10-CM | POA: Insufficient documentation

## 2017-01-06 MED ORDER — METHOCARBAMOL 500 MG PO TABS
500.0000 mg | ORAL_TABLET | Freq: Two times a day (BID) | ORAL | 0 refills | Status: DC
Start: 2017-01-06 — End: 2017-01-27

## 2017-01-06 MED ORDER — NAPROXEN 375 MG PO TABS
375.0000 mg | ORAL_TABLET | Freq: Two times a day (BID) | ORAL | 0 refills | Status: DC
Start: 2017-01-06 — End: 2017-01-27

## 2017-01-06 MED FILL — METHOCARBAMOL 500 MG TABLET: 500 | 10 days supply | Qty: 20 | Fill #0

## 2017-01-06 NOTE — Discharge Instructions (Signed)
Apply heat to the area of pain in the back. Perform gentle stretches as demonstrated. No heavy lifting, pulling or bending for the next several days. Take medications as directed. These may cause drowsiness. Off for the next 2 day.
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
## 2017-01-06 NOTE — ED Triage Notes (Signed)
Pt complains of mid back pain for two days.  He states it may be from lifting something heavy.  Pt is able to bend over with very little discomfort but twisting his torso is very painful.  No pain with inspiration.

## 2017-01-08 MED FILL — NAPROXEN 375 MG TABLET: 375 | 10 days supply | Qty: 20 | Fill #0

## 2017-01-27 ENCOUNTER — Encounter: Payer: Self-pay | Admitting: Family Medicine

## 2017-01-27 ENCOUNTER — Ambulatory Visit: Payer: Self-pay | Attending: Family Medicine | Admitting: Family Medicine

## 2017-01-27 ENCOUNTER — Other Ambulatory Visit: Payer: Self-pay

## 2017-01-27 VITALS — BP 127/76 | HR 61 | Temp 98.0°F | Resp 18 | Ht 66.0 in | Wt 183.4 lb

## 2017-01-27 DIAGNOSIS — K219 Gastro-esophageal reflux disease without esophagitis: Secondary | ICD-10-CM | POA: Insufficient documentation

## 2017-01-27 DIAGNOSIS — Z1329 Encounter for screening for other suspected endocrine disorder: Secondary | ICD-10-CM

## 2017-01-27 DIAGNOSIS — E0800 Diabetes mellitus due to underlying condition with hyperosmolarity without nonketotic hyperglycemic-hyperosmolar coma (NKHHC): Secondary | ICD-10-CM

## 2017-01-27 DIAGNOSIS — E785 Hyperlipidemia, unspecified: Secondary | ICD-10-CM | POA: Insufficient documentation

## 2017-01-27 DIAGNOSIS — I10 Essential (primary) hypertension: Secondary | ICD-10-CM | POA: Insufficient documentation

## 2017-01-27 DIAGNOSIS — Z9889 Other specified postprocedural states: Secondary | ICD-10-CM | POA: Insufficient documentation

## 2017-01-27 DIAGNOSIS — M62838 Other muscle spasm: Secondary | ICD-10-CM | POA: Insufficient documentation

## 2017-01-27 DIAGNOSIS — Z87828 Personal history of other (healed) physical injury and trauma: Secondary | ICD-10-CM | POA: Insufficient documentation

## 2017-01-27 DIAGNOSIS — Z7984 Long term (current) use of oral hypoglycemic drugs: Secondary | ICD-10-CM | POA: Insufficient documentation

## 2017-01-27 DIAGNOSIS — E119 Type 2 diabetes mellitus without complications: Secondary | ICD-10-CM | POA: Insufficient documentation

## 2017-01-27 DIAGNOSIS — H938X3 Other specified disorders of ear, bilateral: Secondary | ICD-10-CM

## 2017-01-27 DIAGNOSIS — R0789 Other chest pain: Secondary | ICD-10-CM | POA: Insufficient documentation

## 2017-01-27 LAB — GLUCOSE, POCT (MANUAL RESULT ENTRY): POC Glucose: 111 mg/dl — AB (ref 70–99)

## 2017-01-27 MED ORDER — METHOCARBAMOL 500 MG PO TABS: 500.0000 mg | ORAL_TABLET | Freq: Two times a day (BID) | ORAL | 2 refills | Status: DC

## 2017-01-27 MED ORDER — CETIRIZINE HCL 10 MG PO TABS: 10.0000 mg | ORAL_TABLET | Freq: Every day | ORAL | 1 refills | Status: DC

## 2017-01-27 MED ORDER — ROSUVASTATIN CALCIUM 10 MG PO TABS: 10.0000 mg | ORAL_TABLET | Freq: Every day | ORAL | 3 refills | Status: DC

## 2017-01-27 MED ORDER — GLIMEPIRIDE 2 MG PO TABS
2.0000 mg | ORAL_TABLET | Freq: Every day | ORAL | 3 refills | Status: DC
Start: 2017-01-27 — End: 2017-06-23

## 2017-01-27 MED ORDER — LISINOPRIL 5 MG PO TABS: 5.0000 mg | ORAL_TABLET | Freq: Every day | ORAL | 3 refills | Status: DC

## 2017-01-27 MED ORDER — OMEPRAZOLE 40 MG PO CPDR: 40.0000 mg | DELAYED_RELEASE_CAPSULE | Freq: Every day | ORAL | 3 refills | Status: DC

## 2017-01-27 MED FILL — GLIMEPIRIDE 2 MG TABLET: 2 | 30 days supply | Qty: 30 | Fill #0

## 2017-01-27 MED FILL — METHOCARBAMOL 500 MG TABLET: 500 | 30 days supply | Qty: 60 | Fill #0

## 2017-01-27 MED FILL — ?CETIRIZINE HCL 10 MG TABLE: 10 | 30 days supply | Qty: 30 | Fill #0

## 2017-01-27 MED FILL — OMEPRAZOLE DR 40 MG CAPSULE: 40 | 30 days supply | Qty: 30 | Fill #0

## 2017-01-27 MED FILL — ?LISINOPRIL 5 MG TABLET: 5 | 30 days supply | Qty: 30 | Fill #0

## 2017-01-27 MED FILL — ROSUVASTATIN CALCIUM 10 MG: 10 | 30 days supply | Qty: 30 | Fill #0

## 2017-01-27 NOTE — Patient Instructions (Signed)
La diabetes mellitus y los alimentos (Diabetes Mellitus and Food) Es importante que controle su nivel de azcar en la sangre (glucosa). El nivel de glucosa en sangre depende en gran medida de lo que usted come. Comer alimentos saludables en las cantidades [place] a lo largo del Training and development officer, aproximadamente a la misma hora [employer], lo ayudar a Chief Technology Officer su nivel de Multimedia programmer. Tambin puede ayudarlo a retrasar o Patent attorney de la diabetes mellitus. Comer de [employer] saludable incluso puede ayudarlo a Chartered loss adjuster de presin arterial y a Science writer o Theatre manager un peso saludable. Entre las recomendaciones generales para alimentarse y Audiological scientist los alimentos de forma saludable, se incluyen las siguientes:  Respetar las comidas principales y comer colaciones con regularidad. Evitar pasar largos perodos sin comer con el fin de perder peso.  Seguir una dieta que consista principalmente en alimentos de origen vegetal, como frutas, vegetales, frutos secos, legumbres y cereales integrales.  Utilizar mtodos de coccin a baja temperatura, como hornear, en lugar de mtodos de coccin a alta temperatura, como frer en abundante aceite. Trabaje con el nutricionista para aprender a Financial planner nutricional de las etiquetas de los alimentos. CMO PUEDEN AFECTARME LOS ALIMENTOS? Carbohidratos Los carbohidratos afectan el nivel de glucosa en sangre ms que cualquier otro tipo de alimento. El nutricionista lo ayudar a Teacher, adult education cuntos carbohidratos puede consumir en cada comida y ensearle a contarlos. El recuento de carbohidratos es importante para mantener la glucosa en sangre en un nivel saludable, en especial si utiliza insulina o toma determinados medicamentos para la diabetes mellitus. Alcohol El alcohol puede provocar disminuciones sbitas de la glucosa en sangre (hipoglucemia), en especial si utiliza insulina o toma determinados medicamentos para la diabetes mellitus. La  hipoglucemia es una afeccin que puede poner en peligro la vida. Los sntomas de la hipoglucemia (somnolencia, mareos y Data processing manager) son similares a los sntomas de haber consumido mucho alcohol. Si el mdico lo autoriza a beber alcohol, hgalo con moderacin y siga estas pautas:  Las mujeres no deben beber ms de un trago por da, y los hombres no deben beber ms de dos tragos por Training and development officer. Un trago es igual a:  12 onzas (355 ml) de cerveza  5 onzas de vino (150 ml) de vino  1,5onzas (23m) de bebidas espirituosas  No beba con el estmago vaco.  Mantngase hidratado. Beba agua, gaseosas dietticas o t helado sin azcar.  Las gaseosas comunes, los jugos y otros refrescos podran contener muchos carbohidratos y se dCivil Service fast streamer QU ALIMENTOS NO SE RECOMIENDAN? Cuando haga las elecciones de alimentos, es importante que recuerde que todos los alimentos son distintos. Algunos tienen menos nutrientes que otros por porcin, aunque podran tener la misma cantidad de caloras o carbohidratos. Es difcil darle al cuerpo lo que necesita cuando consume alimentos con menos nutrientes. Estos son algunos ejemplos de alimentos que debera evitar ya que contienen muchas caloras y carbohidratos, pero pocos nutrientes:  GPhysicist, medicaltrans (la mayora de los alimentos procesados incluyen grasas trans en la etiqueta de Informacin nutricional).  Gaseosas comunes.  Jugos.  Caramelos.  Dulces, como tortas, pasteles, rosquillas y g[place]  Comidas fritas. QU ALIMENTOS PUEDO COMER? Consuma alimentos ricos en nutrientes, que nutrirn el cuerpo y lo mantendrn saludable. Los alimentos que debe comer tambin dependern de varios factores, como:  Las caloras que necesita.  Los medicamentos que toma.  Su peso.  El nivel de glucosa en sElizabeth  El nCalhoun Cityde presin arterial.  El nivel de colesterol. Debe consumir  una amplia variedad de alimentos, por ejemplo:  Protenas.  Cortes de carne  magros.  Protenas con bajo contenido de grasas saturadas, como pescado, clara de huevo y frijoles. Evite las carnes procesadas.  Frutas y vegetales.  Frutas y vegetales que pueden ayudar a controlar los niveles sanguneos de glucosa, como manzanas, mangos y batatas.  Productos lcteos.  Elija productos lcteos sin grasa o con bajo contenido de grasa, como leche, yogur y queso.  Cereales, panes, pastas y arroz.  Elija cereales integrales, como panes multicereales, avena en grano y arroz integral. Estos alimentos pueden ayudar a controlar la presin arterial.  Grasas.  Alimentos que contengan grasas saludables, como frutos secos, aguacate, aceite de oliva, aceite de canola y pescado. TODOS LOS QUE PADECEN DIABETES MELLITUS TIENEN EL MISMO PLAN DE COMIDAS? Dado que todas las personas que padecen diabetes mellitus son distintas, no hay un solo plan de comidas que funcione para todos. Es muy importante que se rena con un nutricionista que lo ayudar a crear un plan de comidas adecuado para usted. Esta informacin no tiene como fin reemplazar el consejo del mdico. Asegrese de hacerle al mdico cualquier pregunta que tenga. Document Released: 11/25/2007 Document Revised: 09/08/2014 Document Reviewed: 07/15/2013 Elsevier Interactive Patient Education  2017 Elsevier Inc.  
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
## 2017-01-27 NOTE — Progress Notes (Signed)
Patient is here for f/up  Patient denies pain for today   Patient has taking his medication for today   Patient has eaten for today   Patient needs refill on glimepiride lisinopril omeprazole & rosuvastatin

## 2017-01-28 LAB — CMP14+EGFR
ALK PHOS: 118 IU/L — AB (ref 39–117)
ALT: 26 IU/L (ref 0–44)
AST: 23 IU/L (ref 0–40)
Albumin/Globulin Ratio: 2 (ref 1.2–2.2)
Albumin: 4.8 g/dL (ref 3.6–4.8)
BUN/Creatinine Ratio: 34 — ABNORMAL HIGH (ref 10–24)
BUN: 21 mg/dL (ref 8–27)
Bilirubin Total: <0.2 mg/dL (ref 0.0–1.2)
CALCIUM: 10.1 mg/dL (ref 8.6–10.2)
CO2: 22 mmol/L (ref 18–29)
CREATININE: 0.62 mg/dL — AB (ref 0.76–1.27)
Chloride: 105 mmol/L (ref 96–106)
GFR calc Af Amer: 120 mL/min/{1.73_m2} (ref >59)
GFR, EST NON AFRICAN AMERICAN: 104 mL/min/{1.73_m2} (ref >59)
GLUCOSE: 112 mg/dL — AB (ref 65–99)
Globulin, Total: 2.4 g/dL (ref 1.5–4.5)
Potassium: 4.7 mmol/L (ref 3.5–5.2)
Sodium: 141 mmol/L (ref 134–144)
Total Protein: 7.2 g/dL (ref 6.0–8.5)

## 2017-01-28 LAB — TESTOSTERONE, FREE, TOTAL, SHBG
SEX HORMONE BINDING: 56.3 nmol/L (ref 19.3–76.4)
TESTOSTERONE FREE: 1.2 pg/mL — AB (ref 6.6–18.1)
TESTOSTERONE: 22 ng/dL — AB (ref 264–916)

## 2017-02-11 ENCOUNTER — Ambulatory Visit: Payer: Self-pay | Attending: Family Medicine | Admitting: Family Medicine

## 2017-02-11 ENCOUNTER — Encounter: Payer: Self-pay | Admitting: Family Medicine

## 2017-02-11 DIAGNOSIS — E349 Endocrine disorder, unspecified: Secondary | ICD-10-CM

## 2017-02-11 DIAGNOSIS — I10 Essential (primary) hypertension: Secondary | ICD-10-CM | POA: Insufficient documentation

## 2017-02-11 DIAGNOSIS — Z7984 Long term (current) use of oral hypoglycemic drugs: Secondary | ICD-10-CM | POA: Insufficient documentation

## 2017-02-11 DIAGNOSIS — E785 Hyperlipidemia, unspecified: Secondary | ICD-10-CM | POA: Insufficient documentation

## 2017-02-11 DIAGNOSIS — K219 Gastro-esophageal reflux disease without esophagitis: Secondary | ICD-10-CM | POA: Insufficient documentation

## 2017-02-11 DIAGNOSIS — E119 Type 2 diabetes mellitus without complications: Secondary | ICD-10-CM | POA: Insufficient documentation

## 2017-02-11 DIAGNOSIS — Z79899 Other long term (current) drug therapy: Secondary | ICD-10-CM | POA: Insufficient documentation

## 2017-02-11 MED ORDER — TESTOSTERONE CYPIONATE 100 MG/ML IM SOLN: 100.0000 mg | INTRAMUSCULAR | 5 refills | Status: DC
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
## 2017-02-11 NOTE — Patient Instructions (Signed)
Testosterone injection Qu es este medicamento? La TESTOSTERONA es la hormona masculina principal. Mantiene el desarrollo masculino normal, como el aumento de la masa muscular, el vello en el rostro y la voz grave. Se [employer] para tratar los niveles bajos de [place]. Este medicamento puede ser utilizado para otros usos; si tiene alguna pregunta consulte con su proveedor de atencin mdica o con su farmacutico. MARCAS COMUNES: Andro-L.A., Aveed, Delatestryl, Depo-Testosterone, Virilon Qu le debo informar a mi profesional de la salud antes de tomar este medicamento? Necesitan saber si usted presenta alguno de los [employer] o situaciones: cncer diabetes enfermedad cardiaca enfermedad renal enfermedad heptica enfermedad pulmonar enfermedad de la prstata una reaccin alrgica o inusual a la testosterona, a otros medicamentos, alimentos, colorantes o conservantes si est embarazada o buscando quedar embarazada si est amamantando a un beb Cmo debo utilizar este medicamento? Este medicamento se administra mediante una inyeccin por va intramuscular. Generalmente lo administra un profesional de Technical sales engineer en un hospital o en un entorno clnico. Hable con su pediatra para informarse acerca del uso de este medicamento en nios. Aunque este medicamento ha sido recetado a nios tan menores como de 12 aos de edad para condiciones selectivas, las precauciones se aplican. Sobredosis: Pngase en contacto inmediatamente con un centro toxicolgico o una sala de urgencia si usted cree que haya tomado demasiado medicamento. ATENCIN: [employer] es solo para usted. No comparta este medicamento con nadie. Qu sucede si me olvido de una dosis? Trate de no olvidar ninguna dosis. Su mdico o su profesional de Librarian, academic indicarn cuando es la prxima inyeccin. Notifique a la clinica si no puede asistir a una cita. Qu puede interactuar con este medicamento? medicamentos para  la diabetes medicamentos que tratan o previenen cogulos sanguneos, como warfarina oxifenbutazona propranolol medicamentos esteroideos, como la prednisona o la cortisona Puede ser que esta lista no menciona todas las posibles interacciones. Informe a su profesional de [employer]	[place] de [employer] productos a base de hierbas, medicamentos de [place] o suplementos nutritivos que est tomando. Si usted fuma, consume bebidas alcohlicas o si utiliza drogas ilegales, indqueselo tambin a su profesional de [employer]	[place]. Algunas sustancias pueden interactuar con su medicamento. A qu debo estar atento al usar Coca[employer]? Visite a su mdico o a su profesional de la salud para revisar regularmente su evolucin. Necesitarn revisar su nivel de Barista. Este medicamento est aprobado solamente para uso en hombres que tienen niveles bajos de testosterona relacionados con ciertos problemas mdicos. Se han informado ataques cardiacos y derrames cerebrales con el uso de [place]. Notifique a su mdico o a su profesional de la salud y busque tratamiento mdico de emergencia si desarrolla problemas respiratorios; cambios en la visin; confusin; dolor u opresin en el pecho; dolor repentino en el brazo; dolor de cabeza repentino e intenso; problemas para hablar o comprender; entumecimiento o debilidad repentina en el rostro, el brazo o la pierna; prdida del equilibrio o la coordinacin. Hable con su mdico [employer] y beneficios de [employer]. Este medicamento podra [employer] niveles de Dispensing optician. Si tiene diabetes, consulte a su mdico o a su profesional de la salud antes de cambiar su dieta o la dosis de su medicamento para la diabetes. Las inyecciones de testosterona no se utilizan generalmente en mujeres. Las mujeres deben informar a su mdico si estn buscando quedar embarazadas o si creen que estn embarazadas. Existe la posibilidad de efectos secundarios graves  en  un beb sin nacer. Para obtener ms informacin, hable con su profesional de la salud o su farmacutico. Hable con su mdico o profesional de la salud sobre sus opciones de mtodos anticonceptivos mientras toma este medicamento. La [employer] de las organizaciones deportivas prohben que los deportistas usen este frmaco. Qu efectos secundarios puedo tener al utilizar este medicamento? Efectos secundarios que debe informar a su mdico o a Barrister's clerk de la salud tan pronto como sea posible: Chief of Staff, como erupcin cutnea, comezn/picazn o urticarias, e hinchazn de la cara, los labios o la lengua aumento del tamao de las mamas problemas respiratorios cambios en las emociones o el estado de nimo voz grave o ronca perodos menstruales irregulares signos y sntomas de lesin al hgado, como orina amarilla oscura o [place]; sensacin general de estar enfermo o sntomas gripales; heces claras; prdida de apetito; nuseas; dolor en la regin abdominal superior derecha; cansancio o debilidad inusual; color amarillento de los ojos o la piel dolor estomacal hinchazn de tobillos, pies, manos erecciones demasiado frecuentes o persistentes dificultad para orinar o cambios en el volumen de orina Efectos secundarios que generalmente no requieren atencin mdica (infrmelos a su mdico o a su profesional de la salud si persisten o si son molestos): acn cambios en el deseo o desempeo sexual crecimiento de vello facial cada del cabello dolor de cabeza Puede ser que esta lista no menciona todos los posibles efectos secundarios. Comunquese a su mdico por asesoramiento mdico [employer]. Usted puede informar los efectos secundarios a la FDA por telfono al 1-800-FDA-1088. Dnde debo guardar mi medicina? Mantngala fuera del alcance de los nios. Este medicamento puede ser abusado. Mantenga su medicamento en un lugar seguro para protegerlo contra robos. No comparta este medicamento con  nadie. Es peligroso vender o Associate Professor y est prohibido por la ley. Gurdela a temperatura ambiente de entre 20 y 56 grados C (74 y 73 grados F). No la congele. Protjala de la luz. Siga las instrucciones para le producto que se le haya recetado. Deseche todo el medicamento que no haya utilizado, despus de la fecha de vencimiento. ATENCIN: Este folleto es un resumen. Puede ser que no cubra toda la posible informacin. Si usted tiene preguntas acerca de esta medicina, consulte con su mdico, su farmacutico o su profesional de Technical sales engineer.  2018 Elsevier/Gold Standard (2016-09-18 00:00:00)

## 2017-02-12 LAB — CBC WITH DIFFERENTIAL/PLATELET
BASOS: 0 %
Basophils Absolute: 0 10*3/uL (ref 0.0–0.2)
EOS (ABSOLUTE): 0.3 10*3/uL (ref 0.0–0.4)
EOS: 5 %
HEMOGLOBIN: 13.2 g/dL (ref 13.0–17.7)
Hematocrit: 40.3 % (ref 37.5–51.0)
IMMATURE GRANS (ABS): 0 10*3/uL (ref 0.0–0.1)
IMMATURE GRANULOCYTES: 0 %
LYMPHS: 46 %
Lymphocytes Absolute: 2.9 10*3/uL (ref 0.7–3.1)
MCH: 30.6 pg (ref 26.6–33.0)
MCHC: 32.8 g/dL (ref 31.5–35.7)
MCV: 94 fL (ref 79–97)
MONOCYTES: 7 %
Monocytes Absolute: 0.4 10*3/uL (ref 0.1–0.9)
NEUTROS ABS: 2.7 10*3/uL (ref 1.4–7.0)
Neutrophils: 42 %
Platelets: 324 10*3/uL (ref 150–379)
RBC: 4.31 x10E6/uL (ref 4.14–5.80)
RDW: 13.3 % (ref 12.3–15.4)
WBC: 6.3 10*3/uL (ref 3.4–10.8)

## 2017-02-12 LAB — PSA, TOTAL AND FREE
PSA, Free Pct: 20 %
PSA, Free: 0.04 ng/mL
Prostate Specific Ag, Serum: 0.2 ng/mL (ref 0.0–4.0)

## 2017-02-17 ENCOUNTER — Ambulatory Visit: Payer: Self-pay

## 2017-02-24 ENCOUNTER — Ambulatory Visit (HOSPITAL_BASED_OUTPATIENT_CLINIC_OR_DEPARTMENT_OTHER): Payer: Self-pay | Admitting: *Deleted

## 2017-02-24 ENCOUNTER — Ambulatory Visit: Payer: Self-pay | Attending: Family Medicine

## 2017-02-24 DIAGNOSIS — E349 Endocrine disorder, unspecified: Secondary | ICD-10-CM

## 2017-02-24 DIAGNOSIS — E291 Testicular hypofunction: Secondary | ICD-10-CM | POA: Insufficient documentation

## 2017-02-24 MED ORDER — TESTOSTERONE CYPIONATE 100 MG/ML IM SOLN
100.0000 mg | INTRAMUSCULAR | Status: AC
  Administered 2017-02-24 – 2017-06-16 (×5): 100 mg via INTRAMUSCULAR

## 2017-02-24 MED FILL — TESTOSTERONE CYP 200 MG/ML: 200 | 28 days supply | Qty: 1 | Fill #0

## 2017-02-24 NOTE — Progress Notes (Signed)
Patient arrives to [facility], accompanied by wife, and here today for testosterone injection. Given today 02/24/2017  upper outer quadrant.  Site unremarkable & patient tolerated injection. Next injection due in 28 days. Appointment made for 03/24/2017. Reminder card given. Carilyn Goodpasture, RN,BSN

## 2017-03-03 ENCOUNTER — Telehealth (HOSPITAL_COMMUNITY): Payer: Self-pay | Admitting: Emergency Medicine

## 2017-03-03 ENCOUNTER — Ambulatory Visit (HOSPITAL_COMMUNITY)
Admission: EM | Admit: 2017-03-03 | Discharge: 2017-03-03 | Disposition: A | Discharge status: Home / Self Care | Payer: Self-pay | Attending: Internal Medicine | Admitting: Internal Medicine

## 2017-03-03 ENCOUNTER — Encounter (HOSPITAL_COMMUNITY): Payer: Self-pay | Admitting: *Deleted

## 2017-03-03 DIAGNOSIS — S0501XA Injury of conjunctiva and corneal abrasion without foreign body, right eye, initial encounter: Secondary | ICD-10-CM

## 2017-03-03 MED ORDER — SULFACETAMIDE SODIUM 10 % OP SOLN: 1.0000 [drp] | OPHTHALMIC | 0 refills | Status: DC

## 2017-03-03 MED ORDER — POLYETHYL GLYCOL-PROPYL GLYCOL 0.4-0.3 % OP GEL: 1.0000 "application " | Freq: Four times a day (QID) | OPHTHALMIC | 0 refills | Status: DC

## 2017-03-03 NOTE — Telephone Encounter (Signed)
Pt came in and [facility] not have the meds in stock   Wanted to know if we could call in meds to [facility])  Per Ivar Drape, [place]... Ok to call in.
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
## 2017-03-03 NOTE — Discharge Instructions (Signed)
Start sulfacetamide 1-2 drops in the right eye every 4 hours. Use artifical tears gel every 6 hours as needed for comfort. Do not apply the gel before the antibiotic eye drop. The gel can make your vision blurry. Wear protective eye wear during work.
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
## 2017-03-05 MED FILL — SULFACETAMIDE 10% EYE DROPS: 10 | 15 days supply | Qty: 5 | Fill #0

## 2017-03-09 MED FILL — ROSUVASTATIN CALCIUM 10 MG: 10 | 30 days supply | Qty: 30 | Fill #1

## 2017-03-10 ENCOUNTER — Other Ambulatory Visit: Payer: Self-pay | Admitting: *Deleted

## 2017-03-10 MED ORDER — TESTOSTERONE CYPIONATE 200 MG/ML IM SOLN: 100.0000 mg | INTRAMUSCULAR | 5 refills | Status: DC

## 2017-03-10 NOTE — Telephone Encounter (Signed)
PRINTED FOR PASS PROGRAM 

## 2017-03-12 MED FILL — TRUE METRIX TEST STRIP: 30 days supply | Qty: 100 | Fill #3

## 2017-03-14 ENCOUNTER — Ambulatory Visit (HOSPITAL_COMMUNITY)
Admission: EM | Admit: 2017-03-14 | Discharge: 2017-03-14 | Disposition: A | Discharge status: Home / Self Care | Payer: Self-pay | Attending: Internal Medicine | Admitting: Internal Medicine

## 2017-03-14 ENCOUNTER — Ambulatory Visit (INDEPENDENT_AMBULATORY_CARE_PROVIDER_SITE_OTHER): Payer: Self-pay

## 2017-03-14 ENCOUNTER — Encounter (HOSPITAL_COMMUNITY): Payer: Self-pay | Admitting: Emergency Medicine

## 2017-03-14 DIAGNOSIS — S61112A Laceration without foreign body of left thumb with damage to nail, initial encounter: Secondary | ICD-10-CM

## 2017-03-14 DIAGNOSIS — W312XXA Contact with powered woodworking and forming machines, initial encounter: Secondary | ICD-10-CM

## 2017-03-14 NOTE — ED Triage Notes (Signed)
Pt reports he cut his left thumb with a circular saw around 1800  Last tetanus = 12/23/14  Bleeding controlled... Hx of DM  A&O x4... NAD... Ambulatory

## 2017-03-14 NOTE — Discharge Instructions (Signed)
Take tylenol/motrin for pain. Keep area clean and dry. Follow up with PCP in 2 days for wound recheck.
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
## 2017-03-16 MED FILL — TRUEplus LANCETS 28G MISC: 30 days supply | Qty: 100 | Fill #3

## 2017-03-18 ENCOUNTER — Ambulatory Visit: Payer: Self-pay | Attending: Family Medicine | Admitting: Family Medicine

## 2017-03-18 ENCOUNTER — Encounter: Payer: Self-pay | Admitting: Family Medicine

## 2017-03-18 VITALS — BP 115/68 | HR 67 | Temp 97.8°F | Ht 66.0 in | Wt 185.4 lb

## 2017-03-18 DIAGNOSIS — E1100 Type 2 diabetes mellitus with hyperosmolarity without nonketotic hyperglycemic-hyperosmolar coma (NKHHC): Secondary | ICD-10-CM | POA: Insufficient documentation

## 2017-03-18 DIAGNOSIS — Z79899 Other long term (current) drug therapy: Secondary | ICD-10-CM | POA: Insufficient documentation

## 2017-03-18 DIAGNOSIS — S61112A Laceration without foreign body of left thumb with damage to nail, initial encounter: Secondary | ICD-10-CM

## 2017-03-18 DIAGNOSIS — Z7984 Long term (current) use of oral hypoglycemic drugs: Secondary | ICD-10-CM | POA: Insufficient documentation

## 2017-03-18 DIAGNOSIS — S61012A Laceration without foreign body of left thumb without damage to nail, initial encounter: Secondary | ICD-10-CM | POA: Insufficient documentation

## 2017-03-18 DIAGNOSIS — E785 Hyperlipidemia, unspecified: Secondary | ICD-10-CM | POA: Insufficient documentation

## 2017-03-18 DIAGNOSIS — W298XXA Contact with other powered powered hand tools and household machinery, initial encounter: Secondary | ICD-10-CM | POA: Insufficient documentation

## 2017-03-18 DIAGNOSIS — E0800 Diabetes mellitus due to underlying condition with hyperosmolarity without nonketotic hyperglycemic-hyperosmolar coma (NKHHC): Secondary | ICD-10-CM

## 2017-03-18 DIAGNOSIS — K219 Gastro-esophageal reflux disease without esophagitis: Secondary | ICD-10-CM | POA: Insufficient documentation

## 2017-03-18 DIAGNOSIS — I10 Essential (primary) hypertension: Secondary | ICD-10-CM | POA: Insufficient documentation

## 2017-03-18 LAB — GLUCOSE, POCT (MANUAL RESULT ENTRY): POC GLUCOSE: 129 mg/dL — AB (ref 70–99)

## 2017-03-18 LAB — POCT GLYCOSYLATED HEMOGLOBIN (HGB A1C): HEMOGLOBIN A1C: 6.8

## 2017-03-18 MED ORDER — ACETAMINOPHEN-CODEINE #3 300-30 MG PO TABS: 1.0000 | ORAL_TABLET | Freq: Two times a day (BID) | ORAL | 0 refills | Status: DC | PRN

## 2017-03-18 MED FILL — ACETAMINOPHEN/COD #3 TABLET: 300-30 | 20 days supply | Qty: 40 | Fill #0
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
## 2017-03-24 ENCOUNTER — Encounter: Payer: Self-pay | Admitting: Family Medicine

## 2017-03-24 ENCOUNTER — Ambulatory Visit (HOSPITAL_BASED_OUTPATIENT_CLINIC_OR_DEPARTMENT_OTHER): Payer: Self-pay | Admitting: Family Medicine

## 2017-03-24 ENCOUNTER — Ambulatory Visit: Payer: Self-pay | Attending: Family Medicine | Admitting: *Deleted

## 2017-03-24 VITALS — BP 127/71 | HR 63 | Temp 98.3°F | Resp 18 | Ht 66.0 in | Wt 185.4 lb

## 2017-03-24 DIAGNOSIS — E349 Endocrine disorder, unspecified: Secondary | ICD-10-CM

## 2017-03-24 DIAGNOSIS — E785 Hyperlipidemia, unspecified: Secondary | ICD-10-CM | POA: Insufficient documentation

## 2017-03-24 DIAGNOSIS — I10 Essential (primary) hypertension: Secondary | ICD-10-CM | POA: Insufficient documentation

## 2017-03-24 DIAGNOSIS — E0800 Diabetes mellitus due to underlying condition with hyperosmolarity without nonketotic hyperglycemic-hyperosmolar coma (NKHHC): Secondary | ICD-10-CM

## 2017-03-24 DIAGNOSIS — E1140 Type 2 diabetes mellitus with diabetic neuropathy, unspecified: Secondary | ICD-10-CM | POA: Insufficient documentation

## 2017-03-24 DIAGNOSIS — E870 Hyperosmolality and hypernatremia: Secondary | ICD-10-CM | POA: Insufficient documentation

## 2017-03-24 DIAGNOSIS — K219 Gastro-esophageal reflux disease without esophagitis: Secondary | ICD-10-CM | POA: Insufficient documentation

## 2017-03-24 DIAGNOSIS — Z7984 Long term (current) use of oral hypoglycemic drugs: Secondary | ICD-10-CM | POA: Insufficient documentation

## 2017-03-24 DIAGNOSIS — S61112D Laceration without foreign body of left thumb with damage to nail, subsequent encounter: Secondary | ICD-10-CM

## 2017-03-24 DIAGNOSIS — E1149 Type 2 diabetes mellitus with other diabetic neurological complication: Secondary | ICD-10-CM

## 2017-03-24 DIAGNOSIS — X58XXXD Exposure to other specified factors, subsequent encounter: Secondary | ICD-10-CM | POA: Insufficient documentation

## 2017-03-24 LAB — GLUCOSE, POCT (MANUAL RESULT ENTRY): POC GLUCOSE: 133 mg/dL — AB (ref 70–99)

## 2017-03-24 MED ORDER — GABAPENTIN 300 MG PO CAPS: 300.0000 mg | ORAL_CAPSULE | Freq: Two times a day (BID) | ORAL | 3 refills | Status: DC

## 2017-03-24 MED FILL — GABAPENTIN 300 MG CAPSULE: 300 | 30 days supply | Qty: 60 | Fill #0

## 2017-03-24 NOTE — Progress Notes (Addendum)
Patient here today for testosterone injection.  Testosterone injection given today left upper outer quadrant.  Site unremarkable & patient tolerated injection.  Next injection due 04/21/2017.

## 2017-03-24 NOTE — Patient Instructions (Signed)
Diabetic Neuropathy Diabetic neuropathy is a nerve disease or nerve damage that is caused by diabetes mellitus. About half of all people with diabetes mellitus have some form of nerve damage. Nerve damage is more common in those who have had diabetes mellitus for many years and who generally have not had good control of their blood sugar (glucose) level. Diabetic neuropathy is a common complication of diabetes mellitus. There are three common types of diabetic neuropathy and a fourth type that is less common and less understood:  Peripheral neuropathy-This is the most common type of diabetic neuropathy. It causes damage to the nerves of the feet and legs first and then eventually the hands and arms. The damage affects the ability to sense touch.  Autonomic neuropathy-This type causes damage to the autonomic nervous system, which controls the following functions: ? Heartbeat. ? Body temperature. ? Blood pressure. ? Urination. ? Digestion. ? Sweating. ? Sexual function.  Focal neuropathy-Focal neuropathy can be painful and unpredictable and occurs most often in older adults with diabetes mellitus. It involves a specific nerve or one area and often comes on suddenly. It usually does not cause long-term problems.  Radiculoplexus neuropathy- Sometimes called lumbosacral radiculoplexus neuropathy, radiculoplexus neuropathy affects the nerves of the thighs, hips, buttocks, or legs. It is more common in people with type 2 diabetes mellitus and in older men. It is characterized by debilitating pain, weakness, and atrophy, usually in the thigh muscles.  What are the causes? The cause of peripheral, autonomic, and focal neuropathies is diabetes mellitus that is uncontrolled and high glucose levels. The cause of radiculoplexus neuropathy is unknown. However, it is thought to be caused by inflammation related to uncontrolled glucose levels. What are the signs or symptoms? Peripheral Neuropathy Peripheral  neuropathy develops slowly over time. When the nerves of the feet and legs no longer work there may be:  Burning, stabbing, or aching pain in the legs or feet.  Inability to feel pressure or pain in your feet. This can lead to: ? Thick calluses over pressure areas. ? Pressure sores. ? Ulcers.  Foot deformities.  Reduced ability to feel temperature changes.  Muscle weakness.  Autonomic Neuropathy The symptoms of autonomic neuropathy vary depending on which nerves are affected. Symptoms may include:  Problems with digestion, such as: ? Feeling sick to your stomach (nausea). ? Vomiting. ? Bloating. ? Constipation. ? Diarrhea. ? Abdominal pain.  Difficulty with urination. This occurs if you lose your ability to sense when your bladder is full. Problems include: ? Urine leakage (incontinence). ? Inability to empty your bladder completely (retention).  Rapid or irregular heartbeat (palpitations).  Blood pressure drops when you stand up (orthostatic hypotension). When you stand up you may feel: ? Dizzy. ? Weak. ? Faint.  In men, inability to attain and maintain an erection.  In women, vaginal dryness and problems with decreased sexual desire and arousal.  Problems with body temperature regulation.  Increased or decreased sweating.  Focal Neuropathy  Abnormal eye movements or abnormal alignment of both eyes.  Weakness in the wrist.  Foot drop. This results in an inability to lift the foot properly and abnormal walking or foot movement.  Paralysis on one side of your face (Bell palsy).  Chest or abdominal pain. Radiculoplexus Neuropathy  Sudden, severe pain in your hip, thigh, or buttocks.  Weakness and wasting of thigh muscles.  Difficulty rising from a seated position.  Abdominal swelling.  Unexplained weight loss (usually more than 10 lb [4.5 kg]). How is   this diagnosed? Peripheral Neuropathy Your senses may be tested. Sensory function testing can be  done with:  A light touch using a monofilament.  A vibration with tuning fork.  A sharp sensation with a pin prick.  Other tests that can help diagnose neuropathy are:  Nerve conduction velocity. This test checks the transmission of an electrical current through a nerve.  Electromyography. This shows how muscles respond to electrical signals transmitted by nearby nerves.  Quantitative sensory testing. This is used to assess how your nerves respond to vibrations and changes in temperature.  Autonomic Neuropathy Diagnosis is often based on reported symptoms. Tell your health care provider if you experience:  Dizziness.  Constipation.  Diarrhea.  Inappropriate urination or inability to urinate.  Inability to get or maintain an erection.  Tests that may be done include:  Electrocardiography or Holter monitor. These are tests that can help show problems with the heart rate or heart rhythm.  An X-ray exam may be done.  Focal Neuropathy Diagnosis is made based on your symptoms and what your health care provider finds during your exam. Other tests may be done. They may include:  Nerve conduction velocities. This checks the transmission of electrical current through a nerve.  Electromyography. This shows how muscles respond to electrical signals transmitted by nearby nerves.  Quantitative sensory testing. This test is used to assess how your nerves respond to vibration and changes in temperature.  Radiculoplexus Neuropathy  Often the first thing is to eliminate any other issue or problems that might be the cause, as there is no standard test for diagnosis.  X-ray exam of your spine and lumbar region.  Spinal tap to rule out cancer.  MRI to rule out other lesions. How is this treated? Once nerve damage occurs, it cannot be reversed. The goal of treatment is to keep the disease or nerve damage from getting worse and affecting more nerve fibers. Controlling your blood  glucose level is the key. Most people with radiculoplexus neuropathy see at least a partial improvement over time. You will need to keep your blood glucose and HbA1c levels in the target range determined by your health care provider. Things that help control blood glucose levels include:  Blood glucose monitoring.  Meal planning.  Physical activity.  Diabetes medicine.  Over time, maintaining lower blood glucose levels helps lessen symptoms. Sometimes, prescription pain medicine is needed. Follow these instructions at home:  Do not smoke.  Keep your blood glucose level in the range that you and your health care provider have determined acceptable for you.  Keep your blood pressure level in the range that you and your health care provider have determined acceptable for you.  Eat a well-balanced diet.  Be physically active every day. Include strength training and balance exercises.  Protect your feet. ? Check your feet every day for sores, cuts, blisters, or signs of infection. ? Wear padded socks and supportive shoes. Use orthotic inserts, if necessary. ? Regularly check the insides of your shoes for worn spots. Make sure there are no rocks or other items inside your shoes before you put them on. Contact a health care provider if:  You have burning, stabbing, or aching pain in the legs or feet.  You are unable to feel pressure or pain in your feet.  You develop problems with digestion such as: ? Nausea. ? Vomiting. ? Bloating. ? Constipation. ? Diarrhea. ? Abdominal pain.  You have difficulty with urination, such as: ? Incontinence. ? Retention.    You have palpitations.  You develop orthostatic hypotension. When you stand up you may feel: ? Dizzy. ? Weak. ? Faint.  You cannot attain and maintain an erection (in men).  You have vaginal dryness and problems with decreased sexual desire and arousal (in women).  You have severe pain in your thighs, legs, or  buttocks.  You have unexplained weight loss. This information is not intended to replace advice given to you by your health care provider. Make sure you discuss any questions you have with your health care provider. Document Released: 10/27/2001 Document Revised: 01/24/2016 Document Reviewed: 01/27/2013 [employer] Interactive Patient Education  2017 [employer]  
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
## 2017-03-26 MED FILL — TESTOSTERONE CYP 200 MG/ML: 200 | 28 days supply | Qty: 1 | Fill #1

## 2017-04-04 ENCOUNTER — Encounter (HOSPITAL_COMMUNITY): Payer: Self-pay | Admitting: Emergency Medicine

## 2017-04-04 ENCOUNTER — Ambulatory Visit (INDEPENDENT_AMBULATORY_CARE_PROVIDER_SITE_OTHER): Payer: Self-pay

## 2017-04-04 ENCOUNTER — Ambulatory Visit (HOSPITAL_COMMUNITY)
Admission: EM | Admit: 2017-04-04 | Discharge: 2017-04-04 | Disposition: A | Discharge status: Home / Self Care | Payer: Self-pay | Attending: Radiology | Admitting: Radiology

## 2017-04-04 DIAGNOSIS — E1149 Type 2 diabetes mellitus with other diabetic neurological complication: Secondary | ICD-10-CM

## 2017-04-04 DIAGNOSIS — M79604 Pain in right leg: Secondary | ICD-10-CM

## 2017-04-04 MED ORDER — GABAPENTIN 300 MG PO CAPS: 600.0000 mg | ORAL_CAPSULE | Freq: Two times a day (BID) | ORAL | 3 refills | Status: DC
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
## 2017-04-04 NOTE — ED Triage Notes (Signed)
The patient presented to the [facility] with a complaint of pain to his lower right leg and numbness to the bottom of his foot x 3 days,.

## 2017-04-07 MED FILL — ROSUVASTATIN CALCIUM 10 MG: 10 | 30 days supply | Qty: 30 | Fill #2

## 2017-04-08 ENCOUNTER — Encounter (HOSPITAL_COMMUNITY): Payer: Self-pay | Admitting: Family Medicine

## 2017-04-08 ENCOUNTER — Encounter (HOSPITAL_COMMUNITY): Payer: Self-pay | Admitting: Emergency Medicine

## 2017-04-08 ENCOUNTER — Ambulatory Visit (HOSPITAL_COMMUNITY)
Admission: EM | Admit: 2017-04-08 | Discharge: 2017-04-08 | Disposition: A | Discharge status: Home / Self Care | Payer: Self-pay | Attending: Family Medicine | Admitting: Family Medicine

## 2017-04-08 ENCOUNTER — Emergency Department (HOSPITAL_COMMUNITY)
Admission: EM | Admit: 2017-04-08 | Discharge: 2017-04-09 | Disposition: A | Discharge status: Home / Self Care | Payer: Self-pay | Attending: Emergency Medicine | Admitting: Emergency Medicine

## 2017-04-08 DIAGNOSIS — I10 Essential (primary) hypertension: Secondary | ICD-10-CM | POA: Insufficient documentation

## 2017-04-08 DIAGNOSIS — M5431 Sciatica, right side: Secondary | ICD-10-CM | POA: Insufficient documentation

## 2017-04-08 DIAGNOSIS — E1140 Type 2 diabetes mellitus with diabetic neuropathy, unspecified: Secondary | ICD-10-CM

## 2017-04-08 DIAGNOSIS — Z794 Long term (current) use of insulin: Secondary | ICD-10-CM | POA: Insufficient documentation

## 2017-04-08 DIAGNOSIS — E1149 Type 2 diabetes mellitus with other diabetic neurological complication: Secondary | ICD-10-CM

## 2017-04-08 DIAGNOSIS — E119 Type 2 diabetes mellitus without complications: Secondary | ICD-10-CM | POA: Insufficient documentation

## 2017-04-08 DIAGNOSIS — Z79899 Other long term (current) drug therapy: Secondary | ICD-10-CM | POA: Insufficient documentation

## 2017-04-08 MED ORDER — OXYCODONE-ACETAMINOPHEN 5-325 MG PO TABS
1.0000 | ORAL_TABLET | ORAL | Status: DC | PRN
  Administered 2017-04-08: 1 via ORAL

## 2017-04-08 MED ORDER — OXYCODONE-ACETAMINOPHEN 5-325 MG PO TABS
ORAL_TABLET | ORAL | Status: AC
  Filled 2017-04-08: qty 1

## 2017-04-08 MED ORDER — GABAPENTIN 300 MG PO CAPS: 600.0000 mg | ORAL_CAPSULE | Freq: Three times a day (TID) | ORAL | 3 refills | Status: DC

## 2017-04-08 MED ORDER — OXYCODONE-ACETAMINOPHEN 5-325 MG PO TABS
1.0000 | ORAL_TABLET | Freq: Four times a day (QID) | ORAL | 0 refills | Status: DC | PRN
Start: 2017-04-08 — End: 2017-04-14

## 2017-04-08 NOTE — ED Triage Notes (Signed)
Patient here from home after having 24 hours of pain in right leg.  Patient states that it is the back of the leg, he is having a hard time walking.  He is unable to sleep with the pain.  Patient was seen at [facility] for same, given pain meds but has not gotten any better.  Patient with "electric" pain, sometimes walking and standing help.

## 2017-04-08 NOTE — ED Triage Notes (Signed)
Right thigh pain for one week, today is the worst day of pain.  "electric" pain in right thigh.  Seen 8/4 for the same and symptoms are worse.  No numbness, patient has shooting pain, some in lower leg, but most significant is in thigh
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
## 2017-04-09 MED ORDER — PREDNISONE 20 MG PO TABS
60.0000 mg | ORAL_TABLET | Freq: Once | ORAL | Status: AC
  Administered 2017-04-09: 60 mg via ORAL
  Filled 2017-04-09: qty 3

## 2017-04-09 MED ORDER — OXYCODONE-ACETAMINOPHEN 5-325 MG PO TABS: 2.0000 | ORAL_TABLET | Freq: Once | ORAL | Status: DC

## 2017-04-09 MED ORDER — IBUPROFEN 800 MG PO TABS
800.0000 mg | ORAL_TABLET | Freq: Once | ORAL | Status: AC
  Administered 2017-04-09: 800 mg via ORAL
  Filled 2017-04-09: qty 1

## 2017-04-09 MED ORDER — IBUPROFEN 800 MG PO TABS: 800.0000 mg | ORAL_TABLET | Freq: Three times a day (TID) | ORAL | 0 refills | Status: DC | PRN

## 2017-04-09 MED ORDER — OXYCODONE-ACETAMINOPHEN 5-325 MG PO TABS
1.0000 | ORAL_TABLET | Freq: Once | ORAL | Status: AC
Start: 2017-04-09 — End: 2017-04-09
  Administered 2017-04-09: 1 via ORAL
  Filled 2017-04-09: qty 1

## 2017-04-09 MED ORDER — PREDNISONE 10 MG (21) PO TBPK: ORAL_TABLET | ORAL | 0 refills | Status: DC

## 2017-04-09 MED FILL — ?PREDNISONE 10 MG TABLET: 10 | 6 days supply | Qty: 21 | Fill #0

## 2017-04-09 MED FILL — GABAPENTIN 300 MG CAPSULE: 300 | 10 days supply | Qty: 60 | Fill #0

## 2017-04-09 MED FILL — OXYCODONE W/APAP 5/325 TAB: 5-325 | 7 days supply | Qty: 25 | Fill #0

## 2017-04-09 MED FILL — IBUPROFEN 800 MG TABLET: 800 | 10 days supply | Qty: 30 | Fill #0

## 2017-04-09 NOTE — ED Notes (Addendum)
ED Provider at bedside.  Use of interpretor to communicate.

## 2017-04-09 NOTE — ED Provider Notes (Signed)
TIME SEEN: 12:01 AM  CHIEF COMPLAINT: Right posterior thigh pain, right foot numbness  HPI: Patient is a 66 year old male with non-full dependent diabetes, hypertension, hyperlipidemia with previous L5 laminectomy many years ago after an accident who presents emergency department with one week of right leg pain. Describes the pain is mostly in the posterior thigh and is a "spasm" and feels like "electricity". No aggravating or relieving factors other than walking. He denies any injury. He states the bottom of his right foot has been numb also for the past week. Denies any back pain. No fevers or recent epidural injections. He denies any focal weakness. No bowel or bladder incontinence. No urinary retention. He has been seen in urgent care twice for this. Was initially started on Tylenol 3 which was not helping his pain. Was given Percocet yesterday.   Spanish intrepretor used throughout this visit.  ROS: See HPI Constitutional: no fever  Eyes: no drainage  ENT: no runny nose   Cardiovascular:  no chest pain  Resp: no SOB  GI: no vomiting GU: no dysuria Integumentary: no rash  Allergy: no hives  Musculoskeletal: no leg swelling  Neurological: no slurred speech ROS otherwise negative  PAST MEDICAL HISTORY/PAST SURGICAL HISTORY:  Past Medical History:  Diagnosis Date  . Diabetes mellitus   . Hyperlipidemia   . Hypertension   . PONV (postoperative nausea and vomiting)     MEDICATIONS:  Prior to Admission medications   Medication Sig Start Date End Date Taking? Authorizing Provider  acetaminophen-codeine (TYLENOL #3) 300-30 MG tablet Take 1 tablet by mouth every 12 (twelve) hours as needed for moderate pain. 03/18/17   Arnoldo Morale, MD  Blood Glucose Monitoring Suppl (TRUE METRIX METER) DEVI 1 each by Does not apply route 3 (three) times daily before meals. 06/30/16   Arnoldo Morale, MD  cetirizine (ZYRTEC) 10 MG tablet Take 1 tablet (10 mg total) by mouth daily. 01/27/17   Arnoldo Morale, MD  Ciclopirox 0.77 % gel Apply 1 each topically 2 (two) times daily. 11/11/16   Arnoldo Morale, MD  gabapentin (NEURONTIN) 300 MG capsule Take 2 capsules (600 mg total) by mouth 3 (three) times daily. 04/08/17   Robyn Haber, MD  glimepiride (AMARYL) 2 MG tablet Take 1 tablet (2 mg total) by mouth daily before breakfast. 01/27/17   Arnoldo Morale, MD  glucose blood (TRUE METRIX BLOOD GLUCOSE TEST) test strip Use 3 times daily before meals 06/30/16   Arnoldo Morale, MD  ibuprofen (ADVIL,MOTRIN) 600 MG tablet Take 1 tablet (600 mg total) by mouth every 12 (twelve) hours as needed. 10/28/16   Arnoldo Morale, MD  lisinopril (PRINIVIL,ZESTRIL) 5 MG tablet Take 1 tablet (5 mg total) by mouth daily. 01/27/17   Arnoldo Morale, MD  Multiple Vitamins-Minerals (MULTIVITAMIN ADULT PO) Take 1 tablet by mouth daily.    [provider]  omeprazole (PRILOSEC) 40 MG capsule Take 1 capsule (40 mg total) by mouth daily. 01/27/17   Arnoldo Morale, MD  oxyCODONE-acetaminophen (PERCOCET/ROXICET) 5-325 MG tablet Take 1 tablet by mouth every 6 (six) hours as needed for severe pain. 04/08/17   Robyn Haber, MD  rosuvastatin (CRESTOR) 10 MG tablet Take 1 tablet (10 mg total) by mouth daily. 01/27/17   Arnoldo Morale, MD  testosterone cypionate (DEPO-TESTOSTERONE) 100 MG/ML injection Inject 1 mL (100 mg total) into the muscle every 28 (twenty-eight) days. For IM use only 02/11/17   Arnoldo Morale, MD  testosterone cypionate (DEPOTESTOSTERONE CYPIONATE) 200 MG/ML injection Inject 0.5 mLs (100 mg total)  into the muscle every 28 (twenty-eight) days. 03/10/17   Tresa Garter, MD  TRUEPLUS LANCETS 28G MISC 1 each by Does not apply route 3 (three) times daily before meals. 06/30/16   Arnoldo Morale, MD  vitamin B-12 (CYANOCOBALAMIN) 1000 MCG tablet Take 1,000 mcg by mouth daily.    [provider]  VOLTAREN 1 % GEL APPLY 2 GRAMS TOPICALLY 4 TIMES DAILY. 09/10/16   Arnoldo Morale, MD    ALLERGIES:  No Known  Allergies  SOCIAL HISTORY:  Social History  Substance Use Topics  . Smoking status: Never Smoker  . Smokeless tobacco: Never Used  . Alcohol use No    FAMILY HISTORY: No family history on file.  EXAM: BP 119/74 (BP Location: Right Arm)   Pulse 70   Temp 98.1 F (36.7 C) (Oral)   Resp 17   SpO2 100%  CONSTITUTIONAL: Alert and oriented and responds appropriately to questions. Well-appearing; well-nourished HEAD: Normocephalic EYES: Conjunctivae clear, pupils appear equal, EOMI ENT: normal nose; moist mucous membranes NECK: Supple, no meningismus, no nuchal rigidity, no LAD  CARD: RRR; S1 and S2 appreciated; no murmurs, no clicks, no rubs, no gallops RESP: Normal chest excursion without splinting or tachypnea; breath sounds clear and equal bilaterally; no wheezes, no rhonchi, no rales, no hypoxia or respiratory distress, speaking full sentences ABD/GI: Normal bowel sounds; non-distended; soft, non-tender, no rebound, no guarding, no peritoneal signs, no hepatosplenomegaly BACK:  The back appears normal and is non-tender to palpation, there is no CVA tenderness, previous lumbar surgical scar present, no midline spinal tenderness or step-off or deformity EXT: No significant tenderness over the right posterior thigh and no erythema, warmth, swelling, ecchymosis or deformity. He has 2+ DP pulses bilaterally. No joint effusion. Compartments are all soft. Normal ROM in all joints; non-tender to palpation; no edema; normal capillary refill; no cyanosis, no calf tenderness or swelling    SKIN: Normal color for age and race; warm; no rash NEURO: Moves all extremities equally, decreased sensation in the bottom of the right foot but otherwise sensation to light touch intact diffusely, 2+ deep tendon reflexes in bilateral lower extremity is, no clonus, strength 5/5 in bilateral lower extremities PSYCH: The patient's mood and manner are appropriate. Grooming and personal hygiene are  appropriate.  MEDICAL DECISION MAKING: Patient here with right leg pain in the posterior thigh with numbness in the bottom of his right foot. I suspect that this is radiculopathy. He has no known history of kidney disease. We'll start him on ibuprofen for the next week. He is already taking Percocet. I have also started him on a short course of a prednisone taper. He is a non-insulin-dependent diabetic. We have discussed at length that he needs to watch his blood sugars very closely while on this medication. At this time I do not feel he needs emergent imaging of his back. Doubt fracture of the lumbar spine given no injury and no bony tenderness on exam. Doubt epidural abscess or hematoma, cauda equina, spinal stenosis, discitis, transverse myelitis. Have recommended close outpatient follow-up with a primary care provider if symptoms are not improving with medical management as he may need an MRI as an outpatient. Doubt DVT causing his symptoms. He is extremity are warm and well-perfused with normal pulses. I do not think this is claudication. No sign of cellulitis, septic arthritis, gout on exam. He is able to ambulate. Discussed return precautions with patient and wife. They're comfortable with this plan.  At this time, I do  not feel there is any life-threatening condition present. I have reviewed and discussed all results (EKG, imaging, lab, urine as appropriate) and exam findings with patient/family. I have reviewed nursing notes and appropriate previous records.  I feel the patient is safe to be discharged home without further emergent workup and can continue workup as an outpatient as needed. Discussed usual and customary return precautions. Patient/family verbalize understanding and are comfortable with this plan.  Outpatient follow-up has been provided if needed. All questions have been answered.       Abhi Moccia, Delice Bison, DO 04/09/17 [id number]

## 2017-04-14 ENCOUNTER — Ambulatory Visit: Payer: Self-pay | Attending: Family Medicine | Admitting: Physician Assistant

## 2017-04-14 ENCOUNTER — Encounter: Payer: Self-pay | Admitting: Physician Assistant

## 2017-04-14 ENCOUNTER — Encounter (HOSPITAL_COMMUNITY): Payer: Self-pay | Admitting: Emergency Medicine

## 2017-04-14 ENCOUNTER — Emergency Department (HOSPITAL_COMMUNITY)
Admission: EM | Admit: 2017-04-14 | Discharge: 2017-04-14 | Disposition: A | Discharge status: Home / Self Care | Payer: Self-pay | Attending: Emergency Medicine | Admitting: Emergency Medicine

## 2017-04-14 VITALS — BP 152/90 | HR 76 | Temp 99.4°F | Resp 16 | Wt 180.4 lb

## 2017-04-14 DIAGNOSIS — B029 Zoster without complications: Secondary | ICD-10-CM | POA: Insufficient documentation

## 2017-04-14 DIAGNOSIS — I10 Essential (primary) hypertension: Secondary | ICD-10-CM | POA: Insufficient documentation

## 2017-04-14 DIAGNOSIS — E0800 Diabetes mellitus due to underlying condition with hyperosmolarity without nonketotic hyperglycemic-hyperosmolar coma (NKHHC): Secondary | ICD-10-CM

## 2017-04-14 DIAGNOSIS — E785 Hyperlipidemia, unspecified: Secondary | ICD-10-CM | POA: Insufficient documentation

## 2017-04-14 DIAGNOSIS — E119 Type 2 diabetes mellitus without complications: Secondary | ICD-10-CM | POA: Insufficient documentation

## 2017-04-14 DIAGNOSIS — Z79899 Other long term (current) drug therapy: Secondary | ICD-10-CM | POA: Insufficient documentation

## 2017-04-14 DIAGNOSIS — E870 Hyperosmolality and hypernatremia: Secondary | ICD-10-CM | POA: Insufficient documentation

## 2017-04-14 DIAGNOSIS — Z7984 Long term (current) use of oral hypoglycemic drugs: Secondary | ICD-10-CM | POA: Insufficient documentation

## 2017-04-14 DIAGNOSIS — E1341 Other specified diabetes mellitus with diabetic mononeuropathy: Secondary | ICD-10-CM

## 2017-04-14 DIAGNOSIS — N3000 Acute cystitis without hematuria: Secondary | ICD-10-CM | POA: Insufficient documentation

## 2017-04-14 DIAGNOSIS — E1140 Type 2 diabetes mellitus with diabetic neuropathy, unspecified: Secondary | ICD-10-CM | POA: Insufficient documentation

## 2017-04-14 DIAGNOSIS — R21 Rash and other nonspecific skin eruption: Secondary | ICD-10-CM | POA: Insufficient documentation

## 2017-04-14 DIAGNOSIS — M792 Neuralgia and neuritis, unspecified: Secondary | ICD-10-CM | POA: Insufficient documentation

## 2017-04-14 LAB — POCT URINALYSIS DIPSTICK
Bilirubin, UA: NEGATIVE
Blood, UA: NEGATIVE
GLUCOSE UA: NEGATIVE
KETONES UA: NEGATIVE
Nitrite, UA: NEGATIVE
PROTEIN UA: NEGATIVE
SPEC GRAV UA: 1.015 (ref 1.010–1.025)
UROBILINOGEN UA: 1 U/dL
pH, UA: 7 (ref 5.0–8.0)

## 2017-04-14 LAB — GLUCOSE, POCT (MANUAL RESULT ENTRY): POC Glucose: 212 mg/dl — AB (ref 70–99)

## 2017-04-14 MED ORDER — OXYCODONE-ACETAMINOPHEN 5-325 MG PO TABS
1.0000 | ORAL_TABLET | Freq: Once | ORAL | Status: AC
Start: 2017-04-14 — End: 2017-04-14
  Administered 2017-04-14: 1 via ORAL
  Filled 2017-04-14: qty 1

## 2017-04-14 MED ORDER — LIDOCAINE 4 % EX LOTN: 1.0000 "application " | TOPICAL_LOTION | Freq: Three times a day (TID) | CUTANEOUS | 0 refills | Status: DC | PRN

## 2017-04-14 MED ORDER — KETOROLAC TROMETHAMINE 60 MG/2ML IM SOLN
60.0000 mg | Freq: Once | INTRAMUSCULAR | Status: AC
  Administered 2017-04-14: 60 mg via INTRAMUSCULAR

## 2017-04-14 MED ORDER — LIDOCAINE 5 % EX PTCH
1.0000 | MEDICATED_PATCH | CUTANEOUS | Status: DC
  Administered 2017-04-14: 1 via TRANSDERMAL
  Filled 2017-04-14: qty 1

## 2017-04-14 MED ORDER — OXYCODONE-ACETAMINOPHEN 5-325 MG PO TABS: 1.0000 | ORAL_TABLET | Freq: Three times a day (TID) | ORAL | 0 refills | Status: DC | PRN

## 2017-04-14 MED ORDER — GABAPENTIN 300 MG PO CAPS
300.0000 mg | ORAL_CAPSULE | Freq: Once | ORAL | Status: AC
  Administered 2017-04-14: 300 mg via ORAL
  Filled 2017-04-14: qty 1

## 2017-04-14 MED ORDER — HYDROMORPHONE HCL 1 MG/ML IJ SOLN
1.0000 mg | Freq: Once | INTRAMUSCULAR | Status: AC
Start: 2017-04-14 — End: 2017-04-14
  Administered 2017-04-14: 1 mg via INTRAVENOUS
  Filled 2017-04-14: qty 1

## 2017-04-14 MED ORDER — DOXYCYCLINE HYCLATE 100 MG PO TABS: 100.0000 mg | ORAL_TABLET | Freq: Two times a day (BID) | ORAL | 0 refills | Status: DC

## 2017-04-14 MED ORDER — VALACYCLOVIR HCL 1 G PO TABS: 1000.0000 mg | ORAL_TABLET | Freq: Three times a day (TID) | ORAL | 0 refills | Status: DC

## 2017-04-14 MED FILL — DOXYCYCLINE 100 MG TABLET: 100 | 14 days supply | Qty: 28 | Fill #0

## 2017-04-14 MED FILL — OXYCODONE W/APAP 5/325 TAB: 5-325 | 4 days supply | Qty: 10 | Fill #0

## 2017-04-14 MED FILL — ?VALACYCLOVIR HCL 1 GRAM TA: 1 | 7 days supply | Qty: 21 | Fill #0

## 2017-04-14 NOTE — Patient Instructions (Signed)
Culebrilla (Shingles) La culebrilla, tambin conocida como herpes zster, es una infeccin que causa una erupcin dolorosa en la piel y ampollas llenas de lquido. La culebrilla no est relacionada con el herpes genital, que es una enfermedad de transmisin sexual. Solo se manifiesta en personas que:  Tuvieron varicela.  Recibieron la vacuna contra la varicela. (Esto es poco frecuente). CAUSAS La causa de la culebrilla es el virus de la varicela zster (VVZ), el mismo virus que causa la varicela. Despus de la exposicin al virus de la varicela zster, este permanece en el organismo en un estado inactivo (latente). La culebrilla aparece si el virus se reactiva. Esto puede ocurrir muchos aos despus de la exposicin inicial al virus. No se conocen las causas por las que este virus se reactiva. [place] que tuvieron varicela o recibieron la vacuna contra la varicela estn en riesgo de tener culebrilla. La infeccin es ms frecuente en las personas que:  Tienen ms de 50aos.  Tienen el sistema de defensa del organismo (sistema inmunitario) debilitado, como en los enfermos con VIH, sida o cncer.  Toman medicamentos que debilitan el sistema inmunitario, como los medicamentos para trasplantes.  Estn sometidas a un gran estrs. SNTOMAS Los primeros sntomas de esta afeccin pueden incluir picazn, hormigueo o dolor en una zona de la piel. Este dolor se puede describir como ardor, punzante o pulstil. Unos das o semanas despus de que comienzan los sntomas, aparece una erupcin rojiza y dolorosa en un lado del cuerpo en un patrn con forma de cinto o de banda. Finalmente, la erupcin se convierte en ampollas llenas de lquido que se abren, forman costras y se secan en dos o tres [place]. En cualquier momento durante la infeccin, puede presentar lo siguiente:  Cristy Hilts.  Escalofros.  Dolor de [place].  Malestar estomacal. DIAGNSTICO Esta afeccin se diagnostica  con un examen de la piel. En algunos casos, se extraen muestras de piel o del lquido de las ampollas antes de definir el diagnstico. Estas muestras se examinan con el microscopio o se envan al laboratorio para su anlisis. TRATAMIENTO No hay una cura especfica para esta afeccin. El mdico probablemente le recete medicamentos para ayudarlo a Financial controller, a recuperarse ms rpido y a Customer service manager a Barrister's clerk. Entre los medicamentos se incluyen los siguientes:  Medicamentos antivirales.  Antiinflamatorios.  Analgsicos. Si la zona afectada est en el rostro, podrn derivarlo a un especialista, como un mdico especialista en ojos (oftalmlogo) o en odos, nariz y Investment banker, operational (otorrinolaringlogo) para evitar problemas oculares, dolor crnico o discapacidad. Nuevo los medicamentos solamente como se lo haya indicado el mdico.  Aplique una crema anestsica o una para calmar la picazn en la zona afectada segn las indicaciones del mdico. Cuidado de la erupcin y las ampollas  Tome un bao de agua fra o aplique compresas fras en la zona de la erupcin o las ampollas como se lo haya indicado el mdico. Esto aliviar el dolor y Cabin crew.  Mantenga la zona de la erupcin cubierta con una venda (vendaje). Use ropa holgada para ayudar a Best boy del roce con la erupcin.  Mantenga la erupcin y las ampollas limpias con jabn suave y agua fresca o como se lo indique el mdico.  Controle la erupcin todos los das para detectar signos de infeccin. Estos signos incluyen enrojecimiento, hinchazn y dolor que perdura o [place].  No pellizque las ampollas.  No se rasque  la zona de la erupcin. Instrucciones generales  Haga reposo segn las indicaciones del mdico.  Concurra a todas las visitas de control como se lo haya indicado el mdico. Esto es importante.  Hasta tanto las ampollas formen costras, la infeccin  puede causar varicela en las personas que nunca la tuvieron o no se vacunaron contra la varicela. Para impedir que esto suceda, evite el contacto con [employer], en especial: ? Bebs. ? Embarazadas. ? Nios que Haematologist. ? Personas mayores que han recibido un trasplante. ? Personas con enfermedades crnicas, como leucemia y sida. SOLICITE ATENCIN MDICA SI:  El dolor no se alivia con los [employer].  El dolor no mejora despus de que la erupcin desaparece.  La erupcin parece infectada. Los signos de infeccin incluyen enrojecimiento, hinchazn y dolor que perdura o [place].  SOLICITE ATENCIN MDICA DE INMEDIATO SI:  La erupcin aparece en el rostro o la nariz.  Tiene dolor en el rostro, en la zona de los ojos o tiene prdida de la sensibilidad en un lado del rostro.  Siente dolor o un zumbido en el odo.  Tiene prdida del gusto.  La afeccin empeora.  Esta informacin no tiene Marine scientist el consejo del mdico. Asegrese de hacerle al mdico cualquier pregunta que tenga. Document Released: 05/28/2005 Document Revised: 12/10/2015 Document Reviewed: 06/29/2014 Elsevier Interactive Patient Education  2017 [employer].

## 2017-04-14 NOTE — Progress Notes (Signed)
Patient ID: Michael Campos, male   DOB: 1951-04-29, 66 y.o.   MRN: [id number]   Michael Campos, is a 66 y.o. male  [id number]:[id number]  [id number]  DOB - [birth year]  Subjective:  Chief Complaint and HPI: Michael Campos is a 66 y.o. male here today for a follow up visit After several visits to the Urgent care and ED for various issues including a visit there today.   04/04/2017(UC)=R foot and ankle pain. Ankle films neg 04/08/2017(UC)=R thigh pain.  Dose of gabapentin increased and prescribed percocet 04/08/2017(ED)=prednisone taper for sciatica/radicular pain 04/14/2017-blisters on R foot- was prescribed pain meds and lidocaine  He presents today with continued pain that started ~10days ago in R lower back and hip radiating into R leg and foot and started getting blisters on R foot within the last 24 hours.  He has never had shingles.  He has also been having some problem urinating.  No dysuria/frequency.  No f/c.  No N/V/D.  [place] with [employer] translating.  He describes the pain as sharp and like being shocked with electrical shocks.  Prednisone didn't help.  Ibuprofen with minimal relief.   ED/Hospital notes reviewed.     ROS:   Constitutional:  No f/c, No night sweats, No unexplained weight loss. EENT:  No vision changes, No blurry vision, No hearing changes. No mouth, throat, or ear problems.  Respiratory: No cough, No SOB Cardiac: No CP, no palpitations GI:  No abd pain, No N/V/D. GU: No Urinary s/sx Musculoskeletal: + pain Neuro: No headache, no dizziness, no motor weakness.  Skin: +blisters on R foot Endocrine:  No polydipsia. No polyuria.  Psych: Denies SI/HI  No problems updated.  ALLERGIES: No Known Allergies  PAST MEDICAL HISTORY: Past Medical History:  Diagnosis Date  . Diabetes mellitus   . Hyperlipidemia   . Hypertension   . PONV (postoperative nausea and vomiting)     MEDICATIONS AT HOME: Prior to Admission medications     Medication Sig Start Date End Date Taking? Authorizing Provider  Ciclopirox 0.77 % gel Apply 1 each topically 2 (two) times daily. 11/11/16  Yes Arnoldo Morale, MD  gabapentin (NEURONTIN) 300 MG capsule Take 2 capsules (600 mg total) by mouth 3 (three) times daily. 04/08/17  Yes Robyn Haber, MD  glimepiride (AMARYL) 2 MG tablet Take 1 tablet (2 mg total) by mouth daily before breakfast. 01/27/17  Yes Amao, Charlane Ferretti, MD  glucose blood (TRUE METRIX BLOOD GLUCOSE TEST) test strip Use 3 times daily before meals 06/30/16  Yes Amao, Enobong, MD  ibuprofen (ADVIL,MOTRIN) 800 MG tablet Take 1 tablet (800 mg total) by mouth every 8 (eight) hours as needed for mild pain. 04/09/17  Yes Ward, Cyril Mourning N, DO  lisinopril (PRINIVIL,ZESTRIL) 5 MG tablet Take 1 tablet (5 mg total) by mouth daily. 01/27/17  Yes Arnoldo Morale, MD  Multiple Vitamins-Minerals (MULTIVITAMIN ADULT PO) Take 1 tablet by mouth daily.   Yes [provider]  omeprazole (PRILOSEC) 40 MG capsule Take 1 capsule (40 mg total) by mouth daily. 01/27/17  Yes Arnoldo Morale, MD  oxyCODONE-acetaminophen (PERCOCET/ROXICET) 5-325 MG tablet Take 1 tablet by mouth every 8 (eight) hours as needed for severe pain. 04/14/17  Yes Varney Biles, MD  rosuvastatin (CRESTOR) 10 MG tablet Take 1 tablet (10 mg total) by mouth daily. 01/27/17  Yes Arnoldo Morale, MD  testosterone cypionate (DEPO-TESTOSTERONE) 100 MG/ML injection Inject 1 mL (100 mg total) into the muscle every 28 (twenty-eight) days. For IM use only 02/11/17  Yes Amao, Enobong,  MD  testosterone cypionate (DEPOTESTOSTERONE CYPIONATE) 200 MG/ML injection Inject 0.5 mLs (100 mg total) into the muscle every 28 (twenty-eight) days. 03/10/17  Yes Tresa Garter, MD  vitamin B-12 (CYANOCOBALAMIN) 1000 MCG tablet Take 1,000 mcg by mouth daily.   Yes [provider]  VOLTAREN 1 % GEL APPLY 2 GRAMS TOPICALLY 4 TIMES DAILY. 09/10/16  Yes Arnoldo Morale, MD  acetaminophen-codeine (TYLENOL #3) 300-30  MG tablet Take 1 tablet by mouth every 12 (twelve) hours as needed for moderate pain. Patient not taking: Reported on 04/14/2017 03/18/17   Arnoldo Morale, MD  Blood Glucose Monitoring Suppl (TRUE METRIX METER) DEVI 1 each by Does not apply route 3 (three) times daily before meals. 06/30/16   Arnoldo Morale, MD  cetirizine (ZYRTEC) 10 MG tablet Take 1 tablet (10 mg total) by mouth daily. Patient not taking: Reported on 04/14/2017 01/27/17   Arnoldo Morale, MD  doxycycline (VIBRA-TABS) 100 MG tablet Take 1 tablet (100 mg total) by mouth 2 (two) times daily. 04/14/17   Argentina Donovan, PA-C  Lidocaine 4 % LOTN Apply 1 application topically 3 (three) times daily as needed. Patient not taking: Reported on 04/14/2017 04/14/17   Varney Biles, MD  TRUEPLUS LANCETS 28G MISC 1 each by Does not apply route 3 (three) times daily before meals. 06/30/16   Arnoldo Morale, MD  valACYclovir (VALTREX) 1000 MG tablet Take 1 tablet (1,000 mg total) by mouth 3 (three) times daily. 04/14/17   Argentina Donovan, PA-C     Objective:  EXAM:   Vitals:   04/14/17 1456  BP: (!) 152/90  Pulse: 76  Resp: 16  Temp: 99.4 F (37.4 C)  TempSrc: Oral  SpO2: 96%  Weight: 180 lb 6.4 oz (81.8 kg)    General appearance : A&OX3. NAD. Non-toxic-appearing, but appears very uncomfortable HEENT: Atraumatic and Normocephalic.  PERRLA. EOM intact.  TM clear B. Mouth-MMM, post pharynx WNL w/o erythema, No PND. Neck: supple, no JVD. No cervical lymphadenopathy. No thyromegaly Chest/Lungs:  Breathing-non-labored, Good air entry bilaterally, breath sounds normal without rales, rhonchi, or wheezing  CVS: S1 S2 regular, no murmurs, gallops, rubs  Abdomen: Bowel sounds present, Non tender and not distended with no gaurding, rigidity or rebound. Extremities: Bilateral Lower Ext shows no edema, both legs are warm to touch with = pulse throughout.  DTR =B Neurology:  CN II-XII grossly intact, Non focal.  Pain follows L5 distribution. Psych:   TP linear. J/I WNL. Normal speech. Appropriate eye contact and affect.  Skin:  +cluster vesicular Rash L-5 dermatome of R foot(mid foot dorsum affecete  Data Review Lab Results  Component Value Date   HGBA1C 6.8 03/18/2017   HGBA1C 6.5 10/28/2016   HGBA1C 6.5 06/30/2016     Assessment & Plan   1. Herpes zoster without complication L-5 Dermatome - ketorolac (TORADOL) injection 60 mg; Inject 2 mLs (60 mg total) into the muscle once. - valACYclovir (VALTREX) 1000 MG tablet; Take 1 tablet (1,000 mg total) by mouth 3 (three) times daily.  Dispense: 21 tablet; Refill: 0 Can use Rx Ibuprofen and/or percocet Rx given in ED  2. Essential hypertension Uncontrolled but in pain-will recheck at f/up next week  3. Diabetes mellitus due to underlying condition with hyperosmolarity without coma, without long-term current use of insulin (HCC) Uncontrolled, recent steroid Rx.  Will follow. - Glucose (CBG)  4. Acute cystitis without hematuria Some leukocytes in urine with urinary hesitancy/retention - Urinalysis Dipstick - Urine Culture - doxycycline (VIBRA-TABS) 100 MG tablet;  Take 1 tablet (100 mg total) by mouth 2 (two) times daily.  Dispense: 28 tablet; Refill: 0 To ED if unable to urinate.   Patient have been counseled extensively about nutrition and exercise  Return in about 8 days (around 04/22/2017) for appt with Freeman Caldron for f/up shingles and UTI.  The patient was given clear instructions to go to ER or return to medical center if symptoms don't improve, worsen or new problems develop. The patient verbalized understanding. The patient was told to call to get lab results if they haven't heard anything in the next week.     Freeman Caldron, PA-C [facility] and [address] [facility] [place], [place]   04/14/2017, 3:46 PM

## 2017-04-14 NOTE — Discharge Instructions (Signed)
Please see your doctor this afternoon. Consider seeing Neurologist if the pain is severe.  Return to the ER if the rash is getting worse.

## 2017-04-14 NOTE — ED Triage Notes (Signed)
Pt to ED with c/o pain in right leg for over 10 days.  Pt was seen here for same on 8/8 and dx with sciatica. Pt st's pain meds not helping.  Pt has appt. With MD tomorrow but st's can't wait until then
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
## 2017-04-16 LAB — URINE CULTURE: ORGANISM ID, BACTERIA: NO GROWTH

## 2017-04-21 ENCOUNTER — Ambulatory Visit: Payer: Self-pay

## 2017-04-22 ENCOUNTER — Encounter: Payer: Self-pay | Admitting: Physician Assistant

## 2017-04-22 ENCOUNTER — Ambulatory Visit: Payer: Self-pay | Attending: Family Medicine | Admitting: Physician Assistant

## 2017-04-22 ENCOUNTER — Ambulatory Visit: Payer: Self-pay

## 2017-04-22 VITALS — BP 113/71 | HR 72 | Temp 98.6°F | Ht 66.0 in | Wt 177.0 lb

## 2017-04-22 DIAGNOSIS — Z7984 Long term (current) use of oral hypoglycemic drugs: Secondary | ICD-10-CM | POA: Insufficient documentation

## 2017-04-22 DIAGNOSIS — E0800 Diabetes mellitus due to underlying condition with hyperosmolarity without nonketotic hyperglycemic-hyperosmolar coma (NKHHC): Secondary | ICD-10-CM

## 2017-04-22 DIAGNOSIS — E119 Type 2 diabetes mellitus without complications: Secondary | ICD-10-CM | POA: Insufficient documentation

## 2017-04-22 DIAGNOSIS — R339 Retention of urine, unspecified: Secondary | ICD-10-CM

## 2017-04-22 DIAGNOSIS — E870 Hyperosmolality and hypernatremia: Secondary | ICD-10-CM | POA: Insufficient documentation

## 2017-04-22 DIAGNOSIS — B029 Zoster without complications: Secondary | ICD-10-CM

## 2017-04-22 DIAGNOSIS — E1149 Type 2 diabetes mellitus with other diabetic neurological complication: Secondary | ICD-10-CM

## 2017-04-22 DIAGNOSIS — E349 Endocrine disorder, unspecified: Secondary | ICD-10-CM

## 2017-04-22 DIAGNOSIS — M5417 Radiculopathy, lumbosacral region: Secondary | ICD-10-CM

## 2017-04-22 DIAGNOSIS — I10 Essential (primary) hypertension: Secondary | ICD-10-CM

## 2017-04-22 DIAGNOSIS — Z79899 Other long term (current) drug therapy: Secondary | ICD-10-CM | POA: Insufficient documentation

## 2017-04-22 DIAGNOSIS — M79604 Pain in right leg: Secondary | ICD-10-CM | POA: Insufficient documentation

## 2017-04-22 LAB — GLUCOSE, POCT (MANUAL RESULT ENTRY): POC GLUCOSE: 195 mg/dL — AB (ref 70–99)

## 2017-04-22 MED ORDER — GABAPENTIN 300 MG PO CAPS: 600.0000 mg | ORAL_CAPSULE | Freq: Three times a day (TID) | ORAL | 3 refills | Status: DC

## 2017-04-22 MED ORDER — CELECOXIB 200 MG PO CAPS: 200.0000 mg | ORAL_CAPSULE | Freq: Every day | ORAL | 2 refills | Status: DC

## 2017-04-22 MED ORDER — OXYCODONE-ACETAMINOPHEN 5-325 MG PO TABS: 1.0000 | ORAL_TABLET | Freq: Three times a day (TID) | ORAL | 0 refills | Status: DC | PRN

## 2017-04-22 MED FILL — OXYCODONE W/APAP 5/325 TAB: 5-325 | 4 days supply | Qty: 10 | Fill #0

## 2017-04-22 MED FILL — GABAPENTIN 300 MG CAPSULE: 300 | 15 days supply | Qty: 90 | Fill #0

## 2017-04-22 MED FILL — CELECOXIB 200 MG CAPSULE: 200 | 30 days supply | Qty: 30 | Fill #0

## 2017-04-22 NOTE — Progress Notes (Signed)
Patient ID: Michael Campos, male   DOB: [birth year], 66 y.o.   MRN: [id number]     Michael Campos, is a 66 y.o. male  NGE:[id number]  MWN:[id number]  DOB - [birth year]  Subjective:  Chief Complaint and HPI: Michael Campos is a 66 y.o. male for a follow up visit-I am seeing Michael Campos today for a recheck of his leg pain, back pain, rash, and presumed diagnosis of shingles.  He is here today with his wife.  The rash has started to go away and dry up.  He is continuing to c/o pain in his R posterior thigh that is severe.  Pain is worse with movement or laying on the area.   Leg feels as though it is in a cast.  He is having severe pain. R foot is numb always "[name]" with stratus translating.  He has been compliant with meds.  Still taking percocet with sever pain. Still c/o urinary retention without dysuria or abdominal pain.  ED/Hospital notes reviewed at last OV.   Social History: married  ROS:   Constitutional:  No f/c, No night sweats, No unexplained weight loss. EENT:  No vision changes, No blurry vision, No hearing changes. No mouth, throat, or ear problems.  Respiratory: No cough, No SOB Cardiac: No CP, no palpitations GI:  No abd pain, No N/V/D. GU: + Urinary s/sx as above Musculoskeletal: pain as above Neuro: No headache, no dizziness, no motor weakness.  Skin: No rash Endocrine:  No polydipsia. No polyuria.  Psych: Denies SI/HI  No problems updated.  ALLERGIES: No Known Allergies  PAST MEDICAL HISTORY: Past Medical History:  Diagnosis Date  . Diabetes mellitus   . Hyperlipidemia   . Hypertension   . PONV (postoperative nausea and vomiting)     MEDICATIONS AT HOME: Prior to Admission medications   Medication Sig Start Date End Date Taking? Authorizing Provider  Blood Glucose Monitoring Suppl (TRUE METRIX METER) DEVI 1 each by Does not apply route 3 (three) times daily before meals. 06/30/16  Yes Arnoldo Morale, MD    Ciclopirox 0.77 % gel Apply 1 each topically 2 (two) times daily. 11/11/16  Yes Arnoldo Morale, MD  gabapentin (NEURONTIN) 300 MG capsule Take 2 capsules (600 mg total) by mouth 3 (three) times daily. 04/22/17  Yes Freeman Caldron M, PA-C  glimepiride (AMARYL) 2 MG tablet Take 1 tablet (2 mg total) by mouth daily before breakfast. 01/27/17  Yes Amao, Enobong, MD  glucose blood (TRUE METRIX BLOOD GLUCOSE TEST) test strip Use 3 times daily before meals 06/30/16  Yes Amao, Enobong, MD  lisinopril (PRINIVIL,ZESTRIL) 5 MG tablet Take 1 tablet (5 mg total) by mouth daily. 01/27/17  Yes Arnoldo Morale, MD  Multiple Vitamins-Minerals (MULTIVITAMIN ADULT PO) Take 1 tablet by mouth daily.   Yes [provider]  omeprazole (PRILOSEC) 40 MG capsule Take 1 capsule (40 mg total) by mouth daily. 01/27/17  Yes Arnoldo Morale, MD  oxyCODONE-acetaminophen (PERCOCET/ROXICET) 5-325 MG tablet Take 1 tablet by mouth every 8 (eight) hours as needed for severe pain. 04/22/17  Yes Freeman Caldron M, PA-C  rosuvastatin (CRESTOR) 10 MG tablet Take 1 tablet (10 mg total) by mouth daily. 01/27/17  Yes Arnoldo Morale, MD  testosterone cypionate (DEPO-TESTOSTERONE) 100 MG/ML injection Inject 1 mL (100 mg total) into the muscle every 28 (twenty-eight) days. For IM use only 02/11/17  Yes Arnoldo Morale, MD  testosterone cypionate (DEPOTESTOSTERONE CYPIONATE) 200 MG/ML injection Inject 0.5 mLs (100 mg total) into the muscle every 28 (twenty-eight) days.  03/10/17  Yes Tresa Garter, MD  TRUEPLUS LANCETS 28G MISC 1 each by Does not apply route 3 (three) times daily before meals. 06/30/16  Yes Arnoldo Morale, MD  valACYclovir (VALTREX) 1000 MG tablet Take 1 tablet (1,000 mg total) by mouth 3 (three) times daily. 04/14/17  Yes Argentina Donovan, PA-C  vitamin B-12 (CYANOCOBALAMIN) 1000 MCG tablet Take 1,000 mcg by mouth daily.   Yes [provider]  VOLTAREN 1 % GEL APPLY 2 GRAMS TOPICALLY 4 TIMES DAILY. 09/10/16  Yes Arnoldo Morale, MD  celecoxib (CELEBREX) 200 MG capsule Take 1 capsule (200 mg total) by mouth daily. Prn pain 04/22/17   Argentina Donovan, PA-C  cetirizine (ZYRTEC) 10 MG tablet Take 1 tablet (10 mg total) by mouth daily. Patient not taking: Reported on 04/14/2017 01/27/17   Arnoldo Morale, MD  Lidocaine 4 % LOTN Apply 1 application topically 3 (three) times daily as needed. Patient not taking: Reported on 04/14/2017 04/14/17   Varney Biles, MD     Objective:  EXAM:   Vitals:   04/22/17 0850  BP: 113/71  Pulse: 72  Temp: 98.6 F (37 C)  TempSrc: Oral  SpO2: 98%  Weight: 177 lb (80.3 kg)  Height: 5\' 6"  (1.676 m)    General appearance : A&OX3. NAD. Non-toxic-appearing HEENT: Atraumatic and Normocephalic.  PERRLA. EOM intact.   Neck: supple, no JVD. No cervical lymphadenopathy. No thyromegaly Chest/Lungs:  Breathing-non-labored, Good air entry bilaterally, breath sounds normal without rales, rhonchi, or wheezing  CVS: S1 S2 regular, no murmurs, gallops, rubs  Abdomen: Bowel sounds present, Non tender and not distended with no gaurding, rigidity or rebound. Extremities: Bilateral Lower Ext shows no edema, both legs are warm to touch with = pulse throughout.  Vesicular exanthem on R foot and lower leg scabbing over and healing w/o secondary infection Back with 80% normal ROM.  R leg with =S&ROM vs R.  DTR=B. +/- SLR on R Neurology:  CN II-XII grossly intact, Non focal.   Psych:  TP linear. J/I WNL. Normal speech. Appropriate eye contact and affect.  Skin:  No Rash  Data Review Lab Results  Component Value Date   HGBA1C 6.8 03/18/2017   HGBA1C 6.5 10/28/2016   HGBA1C 6.5 06/30/2016     Assessment & Plan   1. Herpes zoster with possible complication/leg pain Celebrex daily and percocet(use sparingly for severe pain)  2. Essential hypertension Controlled, continue current regimen  3. Diabetes mellitus due to underlying condition with hyperosmolarity without coma, without  long-term current use of insulin (HCC) Uncontrolled, but overall improving since finishing prednisone.  Continue with current regimen and work on diet - Glucose (CBG)  4. Urinary retention Unsure etiology.  UA WNL at last visit - Ambulatory referral to Urology  5. Lumbosacral radiculopathy Unsure if singularly related to shingles or possible herniation due to subjective weakening and possible SLT+ on R - MR Lumbar Spine Wo Contrast; Future - celecoxib (CELEBREX) 200 MG capsule; Take 1 capsule (200 mg total) by mouth daily. Prn pain  Dispense: 30 capsule; Refill: 2 - gabapentin (NEURONTIN) 300 MG capsule; Take 2 capsules (600 mg total) by mouth 3 (three) times daily.  Dispense: 90 capsule; Refill: 3 - oxyCODONE-acetaminophen (PERCOCET/ROXICET) 5-325 MG tablet; Take 1 tablet by mouth every 8 (eight) hours as needed for severe pain.  Dispense: 10 tablet; Refill: 0  6. Other diabetic neurological complication associated with type 2 diabetes mellitus (HCC) Increase dose from BID to TID - gabapentin (NEURONTIN) 300  MG capsule; Take 2 capsules (600 mg total) by mouth 3 (three) times daily.  Dispense: 90 capsule; Refill: 3   Patient have been counseled extensively about nutrition and exercise  Return in about 1 week (around 04/29/2017) for Dr Jarold Song; f/up DM and htn.  The patient was given clear instructions to go to ER or return to medical center if symptoms don't improve, worsen or new problems develop. The patient verbalized understanding. The patient was told to call to get lab results if they haven't heard anything in the next week.     Freeman Caldron, PA-C [facility] [place], [place]   04/22/2017, 9:09 AM

## 2017-04-28 ENCOUNTER — Ambulatory Visit (HOSPITAL_COMMUNITY)
Admission: RE | Admit: 2017-04-28 | Discharge: 2017-04-28 | Disposition: A | Discharge status: Home / Self Care | Payer: Self-pay | Source: Ambulatory Visit | Attending: Physician Assistant | Admitting: Physician Assistant

## 2017-04-28 DIAGNOSIS — M5417 Radiculopathy, lumbosacral region: Secondary | ICD-10-CM

## 2017-04-28 DIAGNOSIS — M4316 Spondylolisthesis, lumbar region: Secondary | ICD-10-CM | POA: Insufficient documentation

## 2017-04-28 DIAGNOSIS — M48061 Spinal stenosis, lumbar region without neurogenic claudication: Secondary | ICD-10-CM | POA: Insufficient documentation

## 2017-04-28 DIAGNOSIS — M5117 Intervertebral disc disorders with radiculopathy, lumbosacral region: Secondary | ICD-10-CM | POA: Insufficient documentation

## 2017-04-28 DIAGNOSIS — M519 Unspecified thoracic, thoracolumbar and lumbosacral intervertebral disc disorder: Secondary | ICD-10-CM | POA: Insufficient documentation

## 2017-04-29 ENCOUNTER — Ambulatory Visit: Payer: Self-pay | Attending: Family Medicine | Admitting: Family Medicine

## 2017-04-29 ENCOUNTER — Other Ambulatory Visit: Payer: Self-pay | Admitting: Physician Assistant

## 2017-04-29 ENCOUNTER — Encounter: Payer: Self-pay | Admitting: Family Medicine

## 2017-04-29 VITALS — BP 117/72 | HR 60 | Temp 97.7°F | Ht 66.0 in | Wt 175.0 lb

## 2017-04-29 DIAGNOSIS — Z9889 Other specified postprocedural states: Secondary | ICD-10-CM | POA: Insufficient documentation

## 2017-04-29 DIAGNOSIS — R9389 Abnormal findings on diagnostic imaging of other specified body structures: Secondary | ICD-10-CM

## 2017-04-29 DIAGNOSIS — B029 Zoster without complications: Secondary | ICD-10-CM | POA: Insufficient documentation

## 2017-04-29 DIAGNOSIS — I10 Essential (primary) hypertension: Secondary | ICD-10-CM | POA: Insufficient documentation

## 2017-04-29 DIAGNOSIS — M5126 Other intervertebral disc displacement, lumbar region: Secondary | ICD-10-CM

## 2017-04-29 DIAGNOSIS — Z79899 Other long term (current) drug therapy: Secondary | ICD-10-CM | POA: Insufficient documentation

## 2017-04-29 DIAGNOSIS — M5136 Other intervertebral disc degeneration, lumbar region: Secondary | ICD-10-CM

## 2017-04-29 DIAGNOSIS — E119 Type 2 diabetes mellitus without complications: Secondary | ICD-10-CM | POA: Insufficient documentation

## 2017-04-29 DIAGNOSIS — E870 Hyperosmolality and hypernatremia: Secondary | ICD-10-CM | POA: Insufficient documentation

## 2017-04-29 DIAGNOSIS — Z7984 Long term (current) use of oral hypoglycemic drugs: Secondary | ICD-10-CM | POA: Insufficient documentation

## 2017-04-29 DIAGNOSIS — M5116 Intervertebral disc disorders with radiculopathy, lumbar region: Secondary | ICD-10-CM | POA: Insufficient documentation

## 2017-04-29 DIAGNOSIS — M5416 Radiculopathy, lumbar region: Secondary | ICD-10-CM

## 2017-04-29 DIAGNOSIS — E0800 Diabetes mellitus due to underlying condition with hyperosmolarity without nonketotic hyperglycemic-hyperosmolar coma (NKHHC): Secondary | ICD-10-CM

## 2017-04-29 LAB — GLUCOSE, POCT (MANUAL RESULT ENTRY): POC Glucose: 169 mg/dl — AB (ref 70–99)

## 2017-04-29 MED ORDER — ACETAMINOPHEN-CODEINE #3 300-30 MG PO TABS: 1.0000 | ORAL_TABLET | Freq: Two times a day (BID) | ORAL | 1 refills | Status: DC | PRN

## 2017-04-29 MED FILL — ACETAMINOPHEN/COD #3 TABLET: 300-30 | 30 days supply | Qty: 60 | Fill #0

## 2017-04-29 NOTE — Patient Instructions (Signed)
Dolor de espalda en adultos (Back Pain, Adult) El dolor de espalda es muy frecuente en los adultos.La causa del dolor de espalda es rara vez peligrosa y el dolor a menudo mejora con el tiempo.Es posible que se desconozca la causa de esta afeccin. Algunas causas comunes son las siguientes:  Distensin de los msculos o ligamentos que sostienen la columna vertebral.  Desgaste (degeneracin) de los discos vertebrales.  Artritis.  Lesiones directas en la espalda. En muchas personas, el dolor de espalda es recurrente. Como rara vez es peligroso, las personas pueden aprender a manejar esta afeccin por s mismas. INSTRUCCIONES PARA EL CUIDADO EN EL HOGAR Controle su dolor de espalda a fin de detectar algn cambio. Las siguientes indicaciones ayudarn a aliviar cualquier molestia que pueda sentir:  Permanezca activo. Si permanece sentado o de pie en un mismo lugar durante mucho tiempo, se tensiona la espalda. No se siente, conduzca o permanezca de pie en un mismo lugar durante ms de 30 minutos seguidos. Realice caminatas cortas en superficies planas tan pronto como le sea posible.Trate de caminar un poco ms de tiempo cada da.  Haga ejercicio regularmente como se lo haya indicado el mdico. El ejercicio ayuda a que su espalda se cure ms rpidamente. Tambin ayuda a prevenir futuras lesiones al mantener los msculos fuertes y flexibles.  No permanezca en la cama.Si hace reposo ms de 1 a 2 das, puede demorar su recuperacin.  Preste atencin a su cuerpo al inclinarse y levantarse. Las posiciones ms cmodas son las que ejercen menos tensin en la espalda en recuperacin. Siempre use tcnicas apropiadas para levantar objetos, como por ejemplo:  Flexionar las rodillas.  Mantener la carga cerca del cuerpo.  No torcerse.  Encuentre una posicin cmoda para dormir. Use un colchn firme y recustese de costado con las rodillas ligeramente flexionadas. Si se recuesta sobre la espalda, coloque  una almohada debajo de las rodillas.  Evite sentir ansiedad o estrs.El estrs aumenta la tensin muscular y puede empeorar el dolor de espalda.Es importante reconocer si se siente ansioso o estresado y aprender maneras de controlarlo, por ejemplo haciendo ejercicio.  Tome los medicamentos solamente como se lo haya indicado el mdico. Los medicamentos de venta libre para aliviar el dolor y la inflamacin a menudo son los ms eficaces.El mdico puede recetarle relajantes musculares.Estos medicamentos ayudan a calmar el dolor de modo que pueda reanudar ms rpidamente sus actividades normales y el ejercicio saludable.  Aplique hielo sobre la zona lesionada.  Ponga el hielo en una bolsa plstica.  Coloque una toalla entre la piel y la bolsa de hielo.  Deje el hielo durante 20minutos, 2 a 3veces por da, durante los primeros 2 o 3das. Despus de eso, puede alternar el hielo y el calor para reducir el dolor y los espasmos.  Mantenga un peso saludable. El exceso de peso ejerce presin adicional sobre la espalda y hace que resulte difcil mantener una buena postura. SOLICITE ATENCIN MDICA SI:  Siente un dolor que no se alivia con reposo o medicamentos.  Siente mucho dolor que se extiende a las piernas o los glteos.  El dolor no mejora en una semana.  Siente dolor por la noche.  Pierde peso.  Siente escalofros o fiebre. SOLICITE ATENCIN MDICA DE INMEDIATO SI:  Tiene nuevos problemas para controlar la vejiga o los intestinos.  Siente debilidad o adormecimiento inusuales en los brazos o en las piernas.  Siente nuseas o vmitos.  Siente dolor abdominal.  Siente que va a desmayarse. Esta informacin   no tiene como fin reemplazar el consejo del mdico. Asegrese de hacerle al mdico cualquier pregunta que tenga. Document Released: 08/18/2005 Document Revised: 09/08/2014 Document Reviewed: 12/20/2013 Elsevier Interactive Patient Education  2017 [employer]  
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
## 2017-04-30 ENCOUNTER — Ambulatory Visit: Payer: Self-pay | Admitting: Family Medicine

## 2017-05-19 ENCOUNTER — Ambulatory Visit: Payer: Self-pay | Attending: Family Medicine | Admitting: *Deleted

## 2017-05-19 ENCOUNTER — Other Ambulatory Visit: Payer: Self-pay | Admitting: Family Medicine

## 2017-05-19 DIAGNOSIS — E349 Endocrine disorder, unspecified: Secondary | ICD-10-CM

## 2017-05-19 DIAGNOSIS — E785 Hyperlipidemia, unspecified: Secondary | ICD-10-CM

## 2017-05-19 MED FILL — LISINOPRIL 5 MG TAB: 5 | 30 days supply | Qty: 30 | Fill #1

## 2017-05-19 MED FILL — IBUPROFEN 600 MG TABLET: 600 | 30 days supply | Qty: 60 | Fill #1

## 2017-05-19 MED FILL — GLIMEPIRIDE 2 MG TABLET: 2 | 30 days supply | Qty: 30 | Fill #1

## 2017-05-19 MED FILL — CELECOXIB 200 MG CAPSULE: 200 | 30 days supply | Qty: 30 | Fill #1

## 2017-05-19 MED FILL — GABAPENTIN 300 MG CAPSULE: 300 | 15 days supply | Qty: 90 | Fill #1

## 2017-05-19 MED FILL — OMEPRAZOLE DR 40 MG CAPSULE: 40 | 30 days supply | Qty: 30 | Fill #1

## 2017-05-19 MED FILL — ROSUVASTATIN CALCIUM 10 MG: 10 | 30 days supply | Qty: 30 | Fill #3

## 2017-05-19 NOTE — Progress Notes (Signed)
Patient here today for testosterone injection.  Administered in left upper outer quadrant.  Site unremarkable & patient tolerated injection.  Next injection due between 06/16/2017. Appointment made for 06/16/2017. Reminder card given. Carilyn Goodpasture, RN,BSN

## 2017-05-28 ENCOUNTER — Ambulatory Visit: Payer: Self-pay | Attending: Family Medicine

## 2017-05-28 MED FILL — TESTOSTERONE CYP 200 MG/ML: 200 | 28 days supply | Qty: 1 | Fill #2

## 2017-06-16 ENCOUNTER — Ambulatory Visit: Payer: Self-pay | Attending: Family Medicine | Admitting: *Deleted

## 2017-06-16 DIAGNOSIS — E349 Endocrine disorder, unspecified: Secondary | ICD-10-CM | POA: Insufficient documentation

## 2017-06-16 MED FILL — ?LISINOPRIL 5 MG TABLET: 5 | 30 days supply | Qty: 30 | Fill #2

## 2017-06-16 MED FILL — ROSUVASTATIN CALCIUM 10 MG: 10 | 30 days supply | Qty: 30 | Fill #0

## 2017-06-16 MED FILL — GABAPENTIN 300 MG CAPSULE: 300 | 15 days supply | Qty: 90 | Fill #2

## 2017-06-16 MED FILL — ?GLIMEPIRIDE 2 MG TABLET: 2 | 30 days supply | Qty: 30 | Fill #2

## 2017-06-16 MED FILL — OMEPRAZOLE DR 40 MG CAPSULE: 40 | 30 days supply | Qty: 30 | Fill #2

## 2017-06-16 NOTE — Progress Notes (Signed)
Patient here today for testosterone injection.  Administered in right upper outer quadrant.  Site unremarkable & patient tolerated injection.  Next injection due 07/14/2017. Appointment made for 07/14/2017.Carilyn Goodpasture, RN,BSN

## 2017-06-17 MED FILL — CELECOXIB 200 MG CAPSULE: 200 | 30 days supply | Qty: 30 | Fill #2

## 2017-06-23 ENCOUNTER — Encounter: Payer: Self-pay | Admitting: Family Medicine

## 2017-06-23 ENCOUNTER — Ambulatory Visit: Payer: Self-pay | Attending: Family Medicine | Admitting: Family Medicine

## 2017-06-23 VITALS — BP 129/78 | HR 65 | Temp 97.8°F | Ht 66.0 in | Wt 179.8 lb

## 2017-06-23 DIAGNOSIS — E291 Testicular hypofunction: Secondary | ICD-10-CM | POA: Insufficient documentation

## 2017-06-23 DIAGNOSIS — Z79899 Other long term (current) drug therapy: Secondary | ICD-10-CM | POA: Insufficient documentation

## 2017-06-23 DIAGNOSIS — Z23 Encounter for immunization: Secondary | ICD-10-CM | POA: Insufficient documentation

## 2017-06-23 DIAGNOSIS — E785 Hyperlipidemia, unspecified: Secondary | ICD-10-CM | POA: Insufficient documentation

## 2017-06-23 DIAGNOSIS — E349 Endocrine disorder, unspecified: Secondary | ICD-10-CM | POA: Insufficient documentation

## 2017-06-23 DIAGNOSIS — K219 Gastro-esophageal reflux disease without esophagitis: Secondary | ICD-10-CM | POA: Insufficient documentation

## 2017-06-23 DIAGNOSIS — E119 Type 2 diabetes mellitus without complications: Secondary | ICD-10-CM | POA: Insufficient documentation

## 2017-06-23 DIAGNOSIS — I10 Essential (primary) hypertension: Secondary | ICD-10-CM | POA: Insufficient documentation

## 2017-06-23 DIAGNOSIS — H938X3 Other specified disorders of ear, bilateral: Secondary | ICD-10-CM

## 2017-06-23 DIAGNOSIS — B0229 Other postherpetic nervous system involvement: Secondary | ICD-10-CM | POA: Insufficient documentation

## 2017-06-23 DIAGNOSIS — E0800 Diabetes mellitus due to underlying condition with hyperosmolarity without nonketotic hyperglycemic-hyperosmolar coma (NKHHC): Secondary | ICD-10-CM

## 2017-06-23 LAB — GLUCOSE, POCT (MANUAL RESULT ENTRY): POC GLUCOSE: 142 mg/dL — AB (ref 70–99)

## 2017-06-23 LAB — POCT GLYCOSYLATED HEMOGLOBIN (HGB A1C): HEMOGLOBIN A1C: 6.8

## 2017-06-23 MED ORDER — LISINOPRIL 5 MG PO TABS: 5.0000 mg | ORAL_TABLET | Freq: Every day | ORAL | 3 refills | Status: DC

## 2017-06-23 MED ORDER — CETIRIZINE HCL 10 MG PO TABS: 10.0000 mg | ORAL_TABLET | Freq: Every day | ORAL | 1 refills | Status: DC

## 2017-06-23 MED ORDER — PREGABALIN 100 MG PO CAPS: 100.0000 mg | ORAL_CAPSULE | Freq: Two times a day (BID) | ORAL | 3 refills | Status: DC

## 2017-06-23 MED ORDER — OMEPRAZOLE 20 MG PO CPDR: 20.0000 mg | DELAYED_RELEASE_CAPSULE | Freq: Every day | ORAL | 3 refills | Status: DC

## 2017-06-23 MED ORDER — DULOXETINE HCL 60 MG PO CPEP: 60.0000 mg | ORAL_CAPSULE | Freq: Every day | ORAL | 3 refills | Status: DC

## 2017-06-23 MED ORDER — GLIMEPIRIDE 2 MG PO TABS: 2.0000 mg | ORAL_TABLET | Freq: Every day | ORAL | 3 refills | Status: DC

## 2017-06-23 MED ORDER — ROSUVASTATIN CALCIUM 10 MG PO TABS: 10.0000 mg | ORAL_TABLET | Freq: Every day | ORAL | 3 refills | Status: DC

## 2017-06-23 MED FILL — ?DULOXETINE HCL DR 60 MG CA: 60 MG | 30 days supply | Qty: 30 | Fill #0

## 2017-06-23 MED FILL — ?CETIRIZINE HCL 10 MG TABLE: 10 | 30 days supply | Qty: 30 | Fill #0
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
## 2017-06-23 NOTE — Patient Instructions (Signed)
Neuralgia postherptica (Postherpetic Neuralgia) La neuralgia postherptica (NPH) es un dolor neural que aparece despus de tener culebrilla. La culebrilla es una erupcin cutnea dolorosa que aparece en un lado del cuerpo, generalmente en el tronco o el rostro. Es causada por el virus de la varicela zoster, el mismo virus que causa la varicela. En las personas que tuvieron varicela, el virus puede reaparecer aos ms tarde y causar culebrilla. Puede tener neuralgia postherptica si sigue teniendo dolor durante 3meses despus de la desaparicin de la erupcin de la culebrilla. La neuralgia postherptica aparece en la misma zona donde se produjo la erupcin cutnea de la culebrilla, y, en la mayora de los casos, desaparece en el trmino de 1ao. La aplicacin de la vacuna contra la culebrilla puede prevenir la neuralgia postherptica. Se recomienda que las personas mayores de 50aos se apliquen esta vacuna, la cual puede prevenir la culebrilla y, adems, reducir el riesgo de neuralgia postherptica si tiene culebrilla. CAUSAS La neuralgia postherptica se debe al dao neural que causa el virus de la varicela zoster. Este dao hipersensibiliza los nervios. FACTORES DE RIESGO El envejecimiento es el principal factor de riesgo de desarrollar neuralgia postherptica. La mayora de las personas que padecen este trastorno son mayores de 60aos. Otros factores de riesgo son:  Sentir un dolor muy intenso antes de que aparezca la erupcin cutnea de la culebrilla.  Tener una erupcin cutnea muy extensa.  Tener culebrilla en el nervio que inerva el rostro y el ojo (nervio trigmino). SIGNOS Y SNTOMAS El dolor es el sntoma principal de neuralgia postherptica. Suele ser muy intenso y puede describirse como un dolor punzante, que provoca sensacin de quemazn o que se parece a una descarga elctrica. El dolor puede ser intermitente o permanente. Los roces suaves sobre la piel o los cambios de temperatura  pueden desencadenar el dolor. Junto con el dolor, puede tener picazn. DIAGNSTICO El mdico puede diagnosticar la neuralgia postherptica en funcin de los sntomas y de los antecedentes de culebrilla. Generalmente, no es necesario realizar anlisis de laboratorio ni otras pruebas diagnsticas. TRATAMIENTO No hay cura para la neuralgia postherptica. El tratamiento se centrar en el alivio del dolor. Generalmente, los analgsicos de venta libre no alivian el dolor que provoca la neuralgia postherptica. Tal vez deba consultar a un especialista en dolor. El tratamiento puede incluir:  Antidepresivos para ayudar con el dolor y mejorar el sueo.  Anticonvulsivos para aliviar el dolor neural.  Analgsicos fuertes (opioides).  Un parche anestsico que se coloca sobre la piel (parche de lidocana). INSTRUCCIONES PARA EL CUIDADO EN EL HOGAR Recuperarse de la neuralgia postherptica puede llevar mucho tiempo. Trabaje en estrecha colaboracin con el mdico y procure tener un buen sistema de apoyo en su casa.  Tome todos los medicamentos como se lo haya indicado el mdico.  Use ropa cmoda y suelta.  Cubra las zonas sensibles con una venda para reducir la friccin de las prendas contra la zona.  Si el fro no empeora el dolor, intente aplicarse una compresa fra o una bolsa con gel para bajar la temperatura en la zona.  Hable con el mdico si est deprimido o desesperado. Convivir con un dolor crnico puede causar depresin. SOLICITE ATENCIN MDICA SI:  El medicamento no le calma el dolor.  Tiene dificultades para controlar el dolor en su casa. Esta informacin no tiene como fin reemplazar el consejo del mdico. Asegrese de hacerle al mdico cualquier pregunta que tenga. Document Released: 12/04/2008 Document Revised: 09/08/2014 Document Reviewed: 08/09/2013 Elsevier Interactive Patient   Education  2018 [employer] Inc[employer]  

## 2017-06-24 ENCOUNTER — Ambulatory Visit (HOSPITAL_COMMUNITY)
Admission: EM | Admit: 2017-06-24 | Discharge: 2017-06-24 | Disposition: A | Discharge status: Home / Self Care | Payer: Self-pay | Attending: Family Medicine | Admitting: Family Medicine

## 2017-06-24 ENCOUNTER — Encounter (HOSPITAL_COMMUNITY): Payer: Self-pay | Admitting: Family Medicine

## 2017-06-24 DIAGNOSIS — R42 Dizziness and giddiness: Secondary | ICD-10-CM

## 2017-06-24 LAB — CBC WITH DIFFERENTIAL/PLATELET
Basophils Absolute: 0 x10E3/uL (ref 0.0–0.2)
Basos: 0 %
EOS (ABSOLUTE): 0.2 x10E3/uL (ref 0.0–0.4)
Eos: 4 %
Hematocrit: 42.6 % (ref 37.5–51.0)
Hemoglobin: 14.3 g/dL (ref 13.0–17.7)
Immature Grans (Abs): 0 x10E3/uL (ref 0.0–0.1)
Immature Granulocytes: 0 %
Lymphocytes Absolute: 2.5 x10E3/uL (ref 0.7–3.1)
Lymphs: 44 %
MCH: 31.2 pg (ref 26.6–33.0)
MCHC: 33.6 g/dL (ref 31.5–35.7)
MCV: 93 fL (ref 79–97)
Monocytes Absolute: 0.5 x10E3/uL (ref 0.1–0.9)
Monocytes: 8 %
Neutrophils Absolute: 2.5 x10E3/uL (ref 1.4–7.0)
Neutrophils: 44 %
Platelets: 313 x10E3/uL (ref 150–379)
RBC: 4.58 x10E6/uL (ref 4.14–5.80)
RDW: 13.6 % (ref 12.3–15.4)
WBC: 5.8 x10E3/uL (ref 3.4–10.8)

## 2017-06-24 LAB — MICROALBUMIN / CREATININE URINE RATIO
Creatinine, Urine: 94.8 mg/dL
MICROALB/CREAT RATIO: 9.8 mg/g{creat} (ref 0.0–30.0)
Microalbumin, Urine: 9.3 ug/mL

## 2017-06-24 LAB — CMP14+EGFR
ALT: 21 IU/L (ref 0–44)
AST: 21 IU/L (ref 0–40)
Albumin/Globulin Ratio: 2.1 (ref 1.2–2.2)
Albumin: 4.6 g/dL (ref 3.6–4.8)
Alkaline Phosphatase: 83 IU/L (ref 39–117)
BUN/Creatinine Ratio: 28 — ABNORMAL HIGH (ref 10–24)
BUN: 16 mg/dL (ref 8–27)
Bilirubin Total: 0.2 mg/dL (ref 0.0–1.2)
CO2: 25 mmol/L (ref 20–29)
Calcium: 9.9 mg/dL (ref 8.6–10.2)
Chloride: 104 mmol/L (ref 96–106)
Creatinine, Ser: 0.57 mg/dL — ABNORMAL LOW (ref 0.76–1.27)
GFR calc Af Amer: 125 mL/min/1.73
GFR calc non Af Amer: 108 mL/min/1.73
Globulin, Total: 2.2 g/dL (ref 1.5–4.5)
Glucose: 155 mg/dL — ABNORMAL HIGH (ref 65–99)
Potassium: 4.7 mmol/L (ref 3.5–5.2)
Sodium: 143 mmol/L (ref 134–144)
Total Protein: 6.8 g/dL (ref 6.0–8.5)

## 2017-06-24 LAB — PSA: Prostate Specific Ag, Serum: 0.6 ng/mL (ref 0.0–4.0)

## 2017-06-24 LAB — LIPID PANEL
Chol/HDL Ratio: 3.7 ratio (ref 0.0–5.0)
Cholesterol, Total: 161 mg/dL (ref 100–199)
HDL: 43 mg/dL
LDL Calculated: 76 mg/dL (ref 0–99)
Triglycerides: 208 mg/dL — ABNORMAL HIGH (ref 0–149)
VLDL Cholesterol Cal: 42 mg/dL — ABNORMAL HIGH (ref 5–40)

## 2017-06-24 NOTE — ED Triage Notes (Addendum)
Pt here for dizziness since last night. Reports didn't sleep well and stomach upset. Denies chest pain, SOB,V,diarrhea. Reports nausea. CBG 133 this am. Seen yesterday and started on a new med, Duloxetine.
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
## 2017-06-24 NOTE — Discharge Instructions (Signed)
Your dizziness is likely from the cymbalta, please stop taking the medication and resume the gabapentin until you can get the lyrica as planned by your PCP.

## 2017-06-25 ENCOUNTER — Other Ambulatory Visit: Payer: Self-pay

## 2017-06-25 DIAGNOSIS — B0229 Other postherpetic nervous system involvement: Secondary | ICD-10-CM

## 2017-06-25 MED ORDER — PREGABALIN 100 MG PO CAPS: 100.0000 mg | ORAL_CAPSULE | Freq: Two times a day (BID) | ORAL | 1 refills | Status: DC

## 2017-07-02 ENCOUNTER — Telehealth: Payer: Self-pay

## 2017-07-02 ENCOUNTER — Other Ambulatory Visit: Payer: Self-pay | Admitting: Family Medicine

## 2017-07-02 DIAGNOSIS — E119 Type 2 diabetes mellitus without complications: Secondary | ICD-10-CM

## 2017-07-02 MED FILL — TRUE METRIX TEST STRIP: 30 days supply | Qty: 100 | Fill #0

## 2017-07-02 MED FILL — TRUEplus LANCETS 28G MISC: 30 days supply | Qty: 100 | Fill #0

## 2017-07-02 NOTE — Telephone Encounter (Signed)
Pt was called and informed of lab results via interpreter.

## 2017-07-13 MED FILL — TESTOSTERONE CYP 200 MG/ML: 200 | 28 days supply | Qty: 1 | Fill #3

## 2017-07-14 ENCOUNTER — Ambulatory Visit: Payer: Self-pay | Attending: Family Medicine | Admitting: *Deleted

## 2017-07-14 DIAGNOSIS — E349 Endocrine disorder, unspecified: Secondary | ICD-10-CM | POA: Insufficient documentation

## 2017-07-14 MED ORDER — TESTOSTERONE CYPIONATE 100 MG/ML IM SOLN
100.0000 mg | Freq: Once | INTRAMUSCULAR | Status: AC
  Administered 2017-07-14: 100 mg via INTRAMUSCULAR

## 2017-07-14 NOTE — Progress Notes (Signed)
Patient here today for testosterone injection. Testosterone cypionate (DEPOTESTOSTERONE CYPIONATE) administered today in the right upper outer quadrant.  Site unremarkable & patient tolerated injection.   Next injection due in 28 days.  Appointment made for 07/14/2017. Reminder card given.  Carilyn Goodpasture, RN,BSN

## 2017-07-21 ENCOUNTER — Other Ambulatory Visit: Payer: Self-pay | Admitting: Neurosurgery

## 2017-07-21 DIAGNOSIS — M5416 Radiculopathy, lumbar region: Secondary | ICD-10-CM

## 2017-07-31 ENCOUNTER — Other Ambulatory Visit: Payer: Self-pay

## 2017-08-01 ENCOUNTER — Ambulatory Visit (HOSPITAL_COMMUNITY)
Admission: EM | Admit: 2017-08-01 | Discharge: 2017-08-01 | Disposition: A | Discharge status: Home / Self Care | Payer: Self-pay | Attending: Family Medicine | Admitting: Family Medicine

## 2017-08-01 ENCOUNTER — Telehealth (HOSPITAL_COMMUNITY): Payer: Self-pay | Admitting: Emergency Medicine

## 2017-08-01 ENCOUNTER — Other Ambulatory Visit: Payer: Self-pay

## 2017-08-01 ENCOUNTER — Encounter (HOSPITAL_COMMUNITY): Payer: Self-pay | Admitting: Emergency Medicine

## 2017-08-01 DIAGNOSIS — H6122 Impacted cerumen, left ear: Secondary | ICD-10-CM

## 2017-08-01 DIAGNOSIS — J069 Acute upper respiratory infection, unspecified: Secondary | ICD-10-CM

## 2017-08-01 DIAGNOSIS — H6121 Impacted cerumen, right ear: Secondary | ICD-10-CM

## 2017-08-01 DIAGNOSIS — H6123 Impacted cerumen, bilateral: Secondary | ICD-10-CM

## 2017-08-01 MED ORDER — BENZONATATE 100 MG PO CAPS: 100.0000 mg | ORAL_CAPSULE | Freq: Three times a day (TID) | ORAL | 0 refills | Status: DC

## 2017-08-01 MED ORDER — AZITHROMYCIN 250 MG PO TABS: ORAL_TABLET | ORAL | 0 refills | Status: DC

## 2017-08-01 MED ORDER — AZITHROMYCIN 250 MG PO TABS: 250.0000 mg | ORAL_TABLET | Freq: Every day | ORAL | 0 refills | Status: AC
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
## 2017-08-01 NOTE — Telephone Encounter (Signed)
Per Irma Newness, PA... Ok to resend Azithromycin to Pharmacy w/corrects SIG  Pt is to take 2 tabs today and 1 tab daily until finished.

## 2017-08-01 NOTE — ED Triage Notes (Signed)
Onset of symptoms one week ago.  Cough, headache, sore throat, some ear pain, productive cough.  Phlegm is yellow.

## 2017-08-01 NOTE — Discharge Instructions (Signed)
You likely having a viral upper respiratory infection. We recommended symptom control. I expect your symptoms to start improving in the next 1-2 weeks.  1. Take a daily allergy pill/anti-histamine like Zyrtec, Claritin, or Store brand consistently for 2 weeks  2. For congestion you may try an oral decongestant like Mucinex or sudafed. You may also try intranasal flonase nasal spray or saline irrigations (neti pot, sinus cleanse)  3. For your sore throat you may try cepacol lozenges, salt water gargles, throat spray. Treatment of congestion may also help your sore throat.  4. For cough you may try Robitussen, Mucinex DM- I have also given you tessalon to take  5. Take Tylenol or Ibuprofen to help with pain/inflammation  6. Stay hydrated, drink plenty of fluids to keep throat coated and less irritated  I have also provided you with an antibiotic for you to fill in about 5 days if your symptoms still not improving.   Please return if symptoms have not improved in 1-2 weeks, development of fever, shortness of breath, chest pain, lightheadedness.

## 2017-08-10 MED FILL — TESTOSTERONE CYP 200 MG/ML: 200 | 28 days supply | Qty: 1 | Fill #4

## 2017-08-11 ENCOUNTER — Ambulatory Visit: Payer: Self-pay | Admitting: Family Medicine

## 2017-08-12 ENCOUNTER — Ambulatory Visit: Payer: Self-pay

## 2017-08-13 ENCOUNTER — Ambulatory Visit: Payer: Self-pay | Attending: Family Medicine | Admitting: *Deleted

## 2017-08-13 DIAGNOSIS — E349 Endocrine disorder, unspecified: Secondary | ICD-10-CM | POA: Insufficient documentation

## 2017-08-13 NOTE — Progress Notes (Signed)
Patient here today for testosterone injection.  Testosterone given today right  upper outer quadrant.  Site unremarkable & patient tolerated injection.  Next injection due between 09/12/2017.

## 2017-08-26 MED FILL — ?LISINOPRIL 5 MG TABLET: 5 | 30 days supply | Qty: 30 | Fill #0

## 2017-08-26 MED FILL — OMEPRAZOLE DR 40 MG CAPSULE: 40 | 30 days supply | Qty: 30 | Fill #3

## 2017-08-26 MED FILL — ROSUVASTATIN CALCIUM 10 MG: 10 | 30 days supply | Qty: 30 | Fill #0

## 2017-08-26 MED FILL — ?GLIMEPIRIDE 2 MG TABLET: 2 | 30 days supply | Qty: 30 | Fill #0

## 2017-08-31 ENCOUNTER — Ambulatory Visit: Payer: Self-pay | Attending: Family Medicine

## 2017-09-14 ENCOUNTER — Ambulatory Visit: Payer: Self-pay

## 2017-09-15 ENCOUNTER — Ambulatory Visit: Payer: Self-pay | Attending: Family Medicine | Admitting: *Deleted

## 2017-09-15 DIAGNOSIS — E349 Endocrine disorder, unspecified: Secondary | ICD-10-CM | POA: Insufficient documentation

## 2017-09-15 MED ORDER — TESTOSTERONE CYPIONATE 100 MG/ML IM SOLN
100.0000 mg | Freq: Once | INTRAMUSCULAR | Status: AC
  Administered 2017-09-15: 100 mg via INTRAMUSCULAR

## 2017-09-15 NOTE — Progress Notes (Signed)
Patient here today for injection. Testosterone injection administered today Left upper outer quadrant.  Site unremarkable & patient tolerated injection.  Next injection due in 28 days. Appointment made for 10/13/2017. Carilyn Goodpasture, RN,BSN

## 2017-09-21 ENCOUNTER — Other Ambulatory Visit: Payer: Self-pay | Admitting: Family Medicine

## 2017-09-23 ENCOUNTER — Encounter: Payer: Self-pay | Admitting: Family Medicine

## 2017-09-23 ENCOUNTER — Ambulatory Visit: Payer: Self-pay | Attending: Family Medicine | Admitting: Family Medicine

## 2017-09-23 ENCOUNTER — Other Ambulatory Visit: Payer: Self-pay

## 2017-09-23 VITALS — BP 130/83 | HR 62 | Temp 98.1°F | Resp 16 | Wt 187.8 lb

## 2017-09-23 DIAGNOSIS — E781 Pure hyperglyceridemia: Secondary | ICD-10-CM | POA: Insufficient documentation

## 2017-09-23 DIAGNOSIS — E291 Testicular hypofunction: Secondary | ICD-10-CM | POA: Insufficient documentation

## 2017-09-23 DIAGNOSIS — Z9889 Other specified postprocedural states: Secondary | ICD-10-CM | POA: Insufficient documentation

## 2017-09-23 DIAGNOSIS — K219 Gastro-esophageal reflux disease without esophagitis: Secondary | ICD-10-CM

## 2017-09-23 DIAGNOSIS — E785 Hyperlipidemia, unspecified: Secondary | ICD-10-CM

## 2017-09-23 DIAGNOSIS — E119 Type 2 diabetes mellitus without complications: Secondary | ICD-10-CM

## 2017-09-23 DIAGNOSIS — Z7984 Long term (current) use of oral hypoglycemic drugs: Secondary | ICD-10-CM | POA: Insufficient documentation

## 2017-09-23 DIAGNOSIS — Z79899 Other long term (current) drug therapy: Secondary | ICD-10-CM | POA: Insufficient documentation

## 2017-09-23 DIAGNOSIS — Z87828 Personal history of other (healed) physical injury and trauma: Secondary | ICD-10-CM | POA: Insufficient documentation

## 2017-09-23 DIAGNOSIS — I10 Essential (primary) hypertension: Secondary | ICD-10-CM

## 2017-09-23 DIAGNOSIS — M546 Pain in thoracic spine: Secondary | ICD-10-CM

## 2017-09-23 LAB — POCT GLYCOSYLATED HEMOGLOBIN (HGB A1C): Hemoglobin A1C: 7.5

## 2017-09-23 LAB — POCT CBG (FASTING - GLUCOSE)-MANUAL ENTRY: Glucose Fasting, POC: 150 mg/dL — AB (ref 70–99)

## 2017-09-23 MED ORDER — GLIMEPIRIDE 4 MG PO TABS: 4.0000 mg | ORAL_TABLET | Freq: Every day | ORAL | 3 refills | Status: DC

## 2017-09-23 MED ORDER — OMEPRAZOLE 20 MG PO CPDR: 20.0000 mg | DELAYED_RELEASE_CAPSULE | Freq: Every day | ORAL | 3 refills | Status: DC

## 2017-09-23 MED ORDER — LISINOPRIL 5 MG PO TABS: 5.0000 mg | ORAL_TABLET | Freq: Every day | ORAL | 3 refills | Status: DC

## 2017-09-23 MED ORDER — METHOCARBAMOL 750 MG PO TABS: 750.0000 mg | ORAL_TABLET | Freq: Two times a day (BID) | ORAL | 3 refills | Status: DC | PRN

## 2017-09-23 MED ORDER — ROSUVASTATIN CALCIUM 10 MG PO TABS: 10.0000 mg | ORAL_TABLET | Freq: Every day | ORAL | 3 refills | Status: DC

## 2017-09-23 MED FILL — ?GLIMEPIRIDE 4 MG TABLET: 4 | 30 days supply | Qty: 30 | Fill #0

## 2017-09-23 MED FILL — LISINOPRIL 5 MG TABLET: 5 | 30 days supply | Qty: 30 | Fill #3

## 2017-09-23 MED FILL — ?OMEPRAZOLE 20 MG CPDR: 20 | 30 days supply | Qty: 30 | Fill #0

## 2017-09-23 MED FILL — ROSUVASTATIN CALCIUM 10 MG: 10 | 30 days supply | Qty: 30 | Fill #0

## 2017-09-23 MED FILL — METHOCARBAMOL 750 MG TABS: 750 | 30 days supply | Qty: 60 | Fill #0
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
## 2017-09-23 NOTE — Progress Notes (Signed)
3 month F/u  Back pain RF on medication: omeprazole and testosterone

## 2017-09-23 NOTE — Patient Instructions (Signed)
Calambres y espasmos musculares (Muscle Cramps and Spasms) Los calambres y los espasmos musculares ocurren cuando los msculos se contraen solos. Generalmente mejoran despus de algunos minutos. Los calambres musculares son dolorosos. Suelen ser ms fuertes y durar ms [employer]. Estos ltimos pueden o no ser dolorosos. Pueden durar unos pocos segundos o mucho ms tiempo. CUIDADOS EN EL HOGAR  Beba suficiente lquido para mantener el pis (orina) claro o de color amarillo plido.  [place], elongue y relaje el msculo.  Si se lo indican, aplquese calor en los msculos tensos o contrados con la frecuencia establecida por el mdico. Use la fuente del calor que le recomiende el mdico. ? Coloque una toalla entre la piel y la fuente de Freight forward[employer]. ? Aplique el calor durante 20 a 21minutos. ? Retire el calor si la piel se le pone de color rojo brillante. Esto es muy importante si no puede sentir el dolor, el calor o el fro. Puede correr un riesgo mayor de sufrir quemaduras.  Si se lo indican, aplique hielo sobre la zona afectada. Esto puede ayudarlo si tiene molestias o dolor despus de un calambre o un espasmo. ? Ponga el hielo en una bolsa plstica. ? Coloque una [employer] piel y la bolsa de hielo. ? Coloque el hielo durante 31minutos, 2 a 3veces por da.  Tome los medicamentos de venta libre y los recetados solamente como se lo haya indicado el mdico.  Est atento a cualquier [employer] sntomas. SOLICITE AYUDA SI:  Los calambres o los espasmos [place] o son ms frecuentes.  Los calambres o los espasmos no mejoran con el [place]. Esta informacin no tiene Marine scientist el consejo del mdico. Asegrese de hacerle al mdico cualquier pregunta que tenga. Document Released: 11/14/2008 Document Revised: 12/13/2012 Document Reviewed: 05/22/2015 Elsevier Interactive Patient Education  [employer].

## 2017-09-24 MED FILL — TESTOSTERONE CYP 200 MG/ML: 200 | 28 days supply | Qty: 1 | Fill #0

## 2017-10-12 MED FILL — TRUEplus LANCETS 28G MISC: 30 days supply | Qty: 100 | Fill #1

## 2017-10-12 MED FILL — TRUE METRIX TEST STRIP: 30 days supply | Qty: 100 | Fill #1

## 2017-10-13 ENCOUNTER — Ambulatory Visit: Payer: Self-pay | Attending: Family Medicine

## 2017-10-13 DIAGNOSIS — E349 Endocrine disorder, unspecified: Secondary | ICD-10-CM

## 2017-10-13 MED ORDER — TESTOSTERONE CYPIONATE 200 MG/ML IM SOLN
200.0000 mg | Freq: Once | INTRAMUSCULAR | Status: AC
Start: 2017-10-13 — End: 2017-10-13
  Administered 2017-10-13: 200 mg via INTRAMUSCULAR

## 2017-10-13 MED ORDER — TESTOSTERONE CYPIONATE 100 MG/ML IM SOLN: 100.0000 mg | Freq: Once | INTRAMUSCULAR | Status: DC

## 2017-10-13 NOTE — Progress Notes (Signed)
Patient was seen today for testosterone injection. Patient was given injection in right gluteal. Patient is to return in 28 days for next injection.

## 2017-10-22 MED FILL — TESTOSTERONE CYP 200 MG/ML: 200 | 28 days supply | Qty: 1 | Fill #1

## 2017-10-22 MED FILL — GLIMEPIRIDE 4 MG TABLET: 4 | 30 days supply | Qty: 30 | Fill #1

## 2017-10-22 MED FILL — LISINOPRIL 5 MG TABLET: 5 | 30 days supply | Qty: 30 | Fill #0

## 2017-10-22 MED FILL — ROSUVASTATIN CALCIUM 10 MG: 10 | 30 days supply | Qty: 30 | Fill #1

## 2017-10-22 MED FILL — ?OMEPRAZOLE 20 MG CPDR: 20 | 30 days supply | Qty: 30 | Fill #1

## 2017-11-05 ENCOUNTER — Encounter (HOSPITAL_COMMUNITY): Payer: Self-pay | Admitting: Family Medicine

## 2017-11-05 ENCOUNTER — Ambulatory Visit (INDEPENDENT_AMBULATORY_CARE_PROVIDER_SITE_OTHER): Payer: Self-pay

## 2017-11-05 ENCOUNTER — Ambulatory Visit (HOSPITAL_COMMUNITY)
Admission: EM | Admit: 2017-11-05 | Discharge: 2017-11-05 | Disposition: A | Discharge status: Home / Self Care | Payer: Self-pay | Attending: Family Medicine | Admitting: Family Medicine

## 2017-11-05 DIAGNOSIS — S63502A Unspecified sprain of left wrist, initial encounter: Secondary | ICD-10-CM

## 2017-11-05 DIAGNOSIS — M25532 Pain in left wrist: Secondary | ICD-10-CM

## 2017-11-05 MED ORDER — HYDROCODONE-ACETAMINOPHEN 5-325 MG PO TABS: 1.0000 | ORAL_TABLET | Freq: Four times a day (QID) | ORAL | 0 refills | Status: DC | PRN

## 2017-11-05 NOTE — ED Triage Notes (Signed)
Pt was at work today and he fell off a ladder catching himself with the  left hand and left knee. Swelling to the right hand. Pt guarding the left hand.

## 2017-11-05 NOTE — ED Provider Notes (Signed)
  [facility]   [id number] 11/05/17 Arrival Time: 1851   SUBJECTIVE:  Michael Campos is a 67 y.o. male who presents to the urgent care with complaint of left hand and arm pain after a fall.  Throbbing pain distal forearm and wrist.       Past Medical History:  Diagnosis Date  . Diabetes mellitus   . Hyperlipidemia   . Hypertension   . PONV (postoperative nausea and vomiting)    History reviewed. No pertinent family history. Social History   Socioeconomic History  . Marital status: Married    Spouse name: Not on file  . Number of children: Not on file  . Years of education: Not on file  . Highest education level: Not on file  Social Needs  . Financial resource strain: Not on file  . Food insecurity - worry: Not on file  . Food insecurity - inability: Not on file  . Transportation needs - medical: Not on file  . Transportation needs - non-medical: Not on file  Occupational History  . Not on file  Tobacco Use  . Smoking status: Never Smoker  . Smokeless tobacco: Never Used  Substance and Sexual Activity  . Alcohol use: No  . Drug use: No  . Sexual activity: Not on file  Other Topics Concern  . Not on file  Social History Narrative  . Not on file   No outpatient medications have been marked as taking for the 11/05/17 encounter [facility] Encounter).   No Known Allergies    ROS: As per HPI, remainder of ROS negative.   OBJECTIVE:   Vitals:   11/05/17 1901  BP: 140/81  Pulse: 74  Resp: 18  SpO2: 97%     General appearance: alert; no distress Eyes: PERRL; EOMI; conjunctiva normal HENT: normocephalic; atraumatic; TMs normal, canal normal, external ears normal without trauma; nasal mucosa normal; oral mucosa normal Neck: supple Back: no CVA tenderness Extremities: no cyanosis or edema; swollen left wrist with limited ROM secondary to pain;  Elbow nontender and fingers without deformity or limited ROM Skin: warm and dry Neurologic:  normal gait; grossly normal Psychological: alert and cooperative; normal mood and affect      Labs:  Results for orders placed or performed in visit on 09/23/17  Glucose (CBG), Fasting  Result Value Ref Range   Glucose Fasting, POC 150 (A) 70 - 99 mg/dL  HgB A1c  Result Value Ref Range   Hemoglobin A1C 7.5     Labs Reviewed - No data to display  Dg Wrist Complete Left  Result Date: 11/05/2017 CLINICAL DATA:  67 y/o  M; fall with left disc injury. EXAM: LEFT WRIST - COMPLETE 3+ VIEW COMPARISON:  05/16/2013 left wrist radiographs FINDINGS: There is no evidence of fracture or dislocation. There is no evidence of arthropathy or other focal bone abnormality. Soft tissues are unremarkable. IMPRESSION: No acute fracture or dislocation identified. Electronically Signed   By: Kristine Garbe M.D.   On: 11/05/2017 19:26       ASSESSMENT & PLAN:  Wrist sprain, left Brace ordered.  Ibuprofen for discomfort   Reviewed expectations re: course of current medical issues. Questions answered. Outlined signs and symptoms indicating need for more acute intervention. Patient verbalized understanding. After Visit Summary given.    Procedures:      Robyn Haber, MD 11/11/17 [phone]

## 2017-11-06 MED FILL — HYDROCODON-APAP 5-325: 5-325 | 3 days supply | Qty: 12 | Fill #0

## 2017-11-10 ENCOUNTER — Ambulatory Visit: Payer: Self-pay | Attending: Family Medicine | Admitting: *Deleted

## 2017-11-10 DIAGNOSIS — E349 Endocrine disorder, unspecified: Secondary | ICD-10-CM | POA: Insufficient documentation

## 2017-11-10 MED ORDER — TESTOSTERONE CYPIONATE 100 MG/ML IM SOLN
100.0000 mg | Freq: Once | INTRAMUSCULAR | Status: AC
  Administered 2017-11-10: 100 mg via INTRAMUSCULAR

## 2017-11-10 NOTE — Progress Notes (Signed)
Patient here today for testosterone injection. This was given today right upper outer quadrant.  Site unremarkable & patient tolerated injection.  Next injection due in 28 days.  Carilyn Goodpasture, RN,BSN

## 2017-11-10 NOTE — Addendum Note (Signed)
Addended by: Carilyn Goodpasture on: 11/10/2017 11:31 AM   Modules accepted: Level of Service

## 2017-11-27 ENCOUNTER — Ambulatory Visit: Payer: Self-pay | Attending: Family Medicine

## 2017-11-30 MED FILL — TESTOSTERONE CYP 200 MG/ML: 200 | 28 days supply | Qty: 1 | Fill #2

## 2017-11-30 MED FILL — ROSUVASTATIN CALCIUM 10 MG: 10 | 30 days supply | Qty: 30 | Fill #2

## 2017-11-30 MED FILL — ?GLIMEPIRIDE 4 MG TABLET: 4 | 30 days supply | Qty: 30 | Fill #2

## 2017-11-30 MED FILL — LISINOPRIL 5 MG TAB: 5 | 30 days supply | Qty: 30 | Fill #1

## 2017-11-30 MED FILL — ?OMEPRAZOLE DR 20MG CAPSULE: 20 | 30 days supply | Qty: 30 | Fill #2

## 2017-12-07 ENCOUNTER — Other Ambulatory Visit: Payer: Self-pay

## 2017-12-07 DIAGNOSIS — B0229 Other postherpetic nervous system involvement: Secondary | ICD-10-CM

## 2017-12-07 MED ORDER — PREGABALIN 100 MG PO CAPS: 100.0000 mg | ORAL_CAPSULE | Freq: Two times a day (BID) | ORAL | 1 refills | Status: DC

## 2017-12-08 ENCOUNTER — Ambulatory Visit: Payer: Self-pay | Attending: Family Medicine | Admitting: *Deleted

## 2017-12-08 DIAGNOSIS — E349 Endocrine disorder, unspecified: Secondary | ICD-10-CM | POA: Insufficient documentation

## 2017-12-08 MED ORDER — TESTOSTERONE CYPIONATE 100 MG/ML IM SOLN
100.0000 mg | Freq: Once | INTRAMUSCULAR | Status: AC
  Administered 2017-12-08: 100 mg via INTRAMUSCULAR

## 2017-12-08 NOTE — Progress Notes (Signed)
Patient here today for testosterone injection.  He was given testosterone injection today in right upper outer quadrant.  Site unremarkable & patient tolerated injection.  Next injection due in 28 days.Carilyn Goodpasture, RN,BSN

## 2017-12-22 ENCOUNTER — Other Ambulatory Visit: Payer: Self-pay

## 2017-12-22 ENCOUNTER — Encounter (HOSPITAL_COMMUNITY): Payer: Self-pay | Admitting: Emergency Medicine

## 2017-12-22 ENCOUNTER — Ambulatory Visit (HOSPITAL_COMMUNITY)
Admission: EM | Admit: 2017-12-22 | Discharge: 2017-12-22 | Disposition: A | Discharge status: Home / Self Care | Payer: Self-pay | Attending: Family Medicine | Admitting: Family Medicine

## 2017-12-22 ENCOUNTER — Ambulatory Visit: Payer: Self-pay | Admitting: Family Medicine

## 2017-12-22 DIAGNOSIS — S61512A Laceration without foreign body of left wrist, initial encounter: Secondary | ICD-10-CM

## 2017-12-22 DIAGNOSIS — W268XXA Contact with other sharp object(s), not elsewhere classified, initial encounter: Secondary | ICD-10-CM

## 2017-12-22 MED ORDER — LIDOCAINE HCL 2 % IJ SOLN
INTRAMUSCULAR | Status: AC
  Filled 2017-12-22: qty 20

## 2017-12-22 NOTE — Discharge Instructions (Signed)
Tiene 7 suturas. Regrese en 7-10 dias para removar  Lava 2 veces cada dia y seco mucha despues.  Regrese si tiene hinchazon, mas rojo, mas dolor o pus.

## 2017-12-22 NOTE — ED Triage Notes (Signed)
States cut hand on "metal" while at home this AM
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
## 2017-12-22 NOTE — ED Notes (Signed)
Left hand slight swelling and discoloration, patient arrived with an ace wrap very tight around wrist.  Removed ace wrap.  Laceration to wrist , no bleeding.  Able to move all fingers.  Hallie, pa at bedside

## 2017-12-28 MED FILL — ?GLIMEPIRIDE 4 MG TABLET: 4 | 30 days supply | Qty: 30 | Fill #3

## 2017-12-28 MED FILL — LISINOPRIL 5 MG TAB: 5 | 30 days supply | Qty: 30 | Fill #2

## 2017-12-28 MED FILL — ROSUVASTATIN CALCIUM 10 MG: 10 | 30 days supply | Qty: 30 | Fill #3

## 2017-12-28 MED FILL — ?OMEPRAZOLE DR 20MG CAPSULE: 20 | 30 days supply | Qty: 30 | Fill #3

## 2017-12-31 ENCOUNTER — Other Ambulatory Visit: Payer: Self-pay | Admitting: Family Medicine

## 2017-12-31 ENCOUNTER — Ambulatory Visit (HOSPITAL_COMMUNITY)
Admission: EM | Admit: 2017-12-31 | Discharge: 2017-12-31 | Disposition: A | Discharge status: Home / Self Care | Payer: Self-pay

## 2017-12-31 DIAGNOSIS — K219 Gastro-esophageal reflux disease without esophagitis: Secondary | ICD-10-CM

## 2017-12-31 MED FILL — TESTOSTERONE CYP 200 MG/ML: 200 | 28 days supply | Qty: 1 | Fill #3

## 2017-12-31 NOTE — ED Triage Notes (Signed)
Pt here for suture removal Wound not quite healed. Instructed by Dr. Carlean Jews to take out every other suture. So 3 of 6 sutures removed. Pt instructed to come back on Monday.

## 2018-01-04 ENCOUNTER — Ambulatory Visit (HOSPITAL_COMMUNITY)
Admission: EM | Admit: 2018-01-04 | Discharge: 2018-01-04 | Disposition: A | Discharge status: Home / Self Care | Payer: Self-pay

## 2018-01-04 DIAGNOSIS — S61512D Laceration without foreign body of left wrist, subsequent encounter: Secondary | ICD-10-CM

## 2018-01-04 DIAGNOSIS — Z4800 Encounter for change or removal of nonsurgical wound dressing: Secondary | ICD-10-CM

## 2018-01-04 NOTE — ED Triage Notes (Signed)
4 sutures placed to well healing laceration to left anterior wrist.   3 1/4 steri strips placed. Supplies given for patient to keep putting on for next week.   Patient denies any pain to area. No signs of infection.

## 2018-01-05 ENCOUNTER — Ambulatory Visit: Payer: Self-pay

## 2018-01-08 ENCOUNTER — Encounter: Payer: Self-pay | Admitting: Family Medicine

## 2018-01-08 ENCOUNTER — Ambulatory Visit: Payer: Self-pay | Attending: Family Medicine | Admitting: Family Medicine

## 2018-01-08 VITALS — BP 128/75 | HR 67 | Temp 97.8°F | Ht 66.0 in | Wt 182.8 lb

## 2018-01-08 DIAGNOSIS — E785 Hyperlipidemia, unspecified: Secondary | ICD-10-CM | POA: Insufficient documentation

## 2018-01-08 DIAGNOSIS — M25532 Pain in left wrist: Secondary | ICD-10-CM | POA: Insufficient documentation

## 2018-01-08 DIAGNOSIS — Z79899 Other long term (current) drug therapy: Secondary | ICD-10-CM | POA: Insufficient documentation

## 2018-01-08 DIAGNOSIS — E349 Endocrine disorder, unspecified: Secondary | ICD-10-CM | POA: Insufficient documentation

## 2018-01-08 DIAGNOSIS — E119 Type 2 diabetes mellitus without complications: Secondary | ICD-10-CM | POA: Insufficient documentation

## 2018-01-08 DIAGNOSIS — K219 Gastro-esophageal reflux disease without esophagitis: Secondary | ICD-10-CM | POA: Insufficient documentation

## 2018-01-08 DIAGNOSIS — Z7984 Long term (current) use of oral hypoglycemic drugs: Secondary | ICD-10-CM | POA: Insufficient documentation

## 2018-01-08 DIAGNOSIS — I10 Essential (primary) hypertension: Secondary | ICD-10-CM | POA: Insufficient documentation

## 2018-01-08 LAB — GLUCOSE, POCT (MANUAL RESULT ENTRY): POC Glucose: 114 mg/dl — AB (ref 70–99)

## 2018-01-08 LAB — POCT GLYCOSYLATED HEMOGLOBIN (HGB A1C): HEMOGLOBIN A1C: 6.8

## 2018-01-08 MED ORDER — LISINOPRIL 5 MG PO TABS: 5.0000 mg | ORAL_TABLET | Freq: Every day | ORAL | 3 refills | Status: DC

## 2018-01-08 MED ORDER — TESTOSTERONE CYPIONATE 200 MG/ML IM SOLN
100.0000 mg | Freq: Once | INTRAMUSCULAR | Status: AC
  Administered 2018-01-08: 100 mg via INTRAMUSCULAR

## 2018-01-08 MED ORDER — GLIMEPIRIDE 4 MG PO TABS: 4.0000 mg | ORAL_TABLET | Freq: Every day | ORAL | 3 refills | Status: DC

## 2018-01-08 MED ORDER — OMEPRAZOLE 40 MG PO CPDR: 40.0000 mg | DELAYED_RELEASE_CAPSULE | Freq: Every day | ORAL | 3 refills | Status: DC

## 2018-01-08 MED ORDER — IBUPROFEN 600 MG PO TABS: 600.0000 mg | ORAL_TABLET | Freq: Three times a day (TID) | ORAL | 1 refills | Status: DC | PRN

## 2018-01-08 MED ORDER — ROSUVASTATIN CALCIUM 10 MG PO TABS: 10.0000 mg | ORAL_TABLET | Freq: Every day | ORAL | 3 refills | Status: DC

## 2018-01-08 NOTE — Patient Instructions (Signed)
Diabetes mellitus y nutrición  Diabetes Mellitus and Nutrition  Si sufre de diabetes (diabetes mellitus), es muy importante tener hábitos alimenticios saludables debido a que sus niveles de azúcar en la sangre (glucosa) se ven afectados en gran medida por lo que come y bebe. [place]er alimentos saludables en las cantidades adecuadas, aproximadamente a la misma hora todos los días, lo ayudará a:  · Controlar la glucemia.  · Disminuir el riesgo de sufrir una enfermedad cardíaca.  · Mejorar la presión arterial.  · Alcanzar o mantener un peso saludable.    Todas las personas que sufren de diabetes son diferentes y cada una tiene necesidades diferentes en cuanto a un plan de alimentación. El médico puede recomendarle que trabaje con un especialista en dietas y nutrición (nutricionista) para elaborar el mejor plan para usted. Su plan de alimentación puede variar según factores como:  · Las calorías que necesita.  · Los medicamentos que toma.  · Su peso.  · Sus niveles de glucemia, presión arterial y colesterol.  · Su nivel de actividad.  · Otras afecciones que tenga, como enfermedades cardíacas o renales.    ¿Cómo me afectan los carbohidratos?  Los carbohidratos afectan el nivel de glucemia más que cualquier otro tipo de alimento. La ingesta de carbohidratos naturalmente aumenta la cantidad glucosa en la sangre. El recuento de carbohidratos es un método destinado a llevar un registro de la cantidad de carbohidratos que se ingieren. El recuento de carbohidratos es importante para mantener la glucemia a un nivel saludable, en especial si utiliza insulina o toma determinados medicamentos por vía oral para la diabetes.  Es importante saber la cantidad de carbohidratos que se pueden ingerir en cada comida sin correr ningún riesgo. Esto es diferente en cada persona. El nutricionista puede ayudarlo a calcular la cantidad de carbohidratos que debe ingerir en cada comida y colación.   Los alimentos que contienen carbohidratos incluyen:  · Pan, cereal, arroz, pasta y galletas.  · Papas y maíz.  · Guisantes, frijoles y lentejas.  · Leche y yogur.  · Frutas y jugo.  · Postres, como pasteles, galletitas, helado y caramelos.    ¿Cómo me afecta el alcohol?  El alcohol puede provocar disminuciones súbitas de la glucemia (hipoglucemia), en especial si utiliza insulina o toma determinados medicamentos por vía oral para la diabetes. La hipoglucemia es una afección potencialmente mortal. Los síntomas de la hipoglucemia (somnolencia, mareos y confusión) son similares a los síntomas de haber consumido demasiado alcohol.  Si el médico afirma que el alcohol es seguro para usted, siga estas pautas:  · Limite el consumo de alcohol a no más de 1 medida por día si es mujer y no está embarazada, y a 2 medidas si es hombre. Una medida equivale a 12 oz (355 ml) de cerveza, 5 oz (148 ml) de vino o 1½ oz (44 ml) de bebidas de alta graduación alcohólica.  · No beba con el estómago vacío.  · Manténgase hidratado con agua, gaseosas dietéticas o té helado sin azúcar.  · Tenga en cuenta que las gaseosas comunes, los jugos y otros refrescos pueden contener mucha azúcar y se deben contar como carbohidratos.    Consejos para seguir este plan  Leer las etiquetas de los alimentos  · Comience por controlar el tamaño de la porción en la etiqueta. La cantidad de calorías, carbohidratos, grasas y otros nutrientes mencionados en la etiqueta se basan en una porción del alimento. Muchos alimentos contienen más de una porción por envase.  · Verifique la cantidad total de gramos (g)   de carbohidratos totales en una porción. Puede calcular la cantidad de porciones de carbohidratos al dividir el total de carbohidratos por 15. Por ejemplo, si un alimento posee un total de 30 g de carbohidratos, equivale a 2 porciones de carbohidratos.  · Verifique la cantidad de gramos (g) de grasas saturadas y grasas trans  en una porción. Escoja alimentos que no contengan grasa o que tengan un bajo contenido.  · Controle la cantidad de miligramos (mg) de sodio en una porción. La mayoría de las personas deben limitar la ingesta de sodio total a menos de 2300 mg por día.  · Siempre consulte la información nutricional de los alimentos etiquetados como “con bajo contenido de grasa” o “sin grasa”. Estos alimentos pueden ser más altos en azúcar agregada o en carbohidratos refinados y deben evitarse.  · Hable con el nutricionista para identificar sus objetivos diarios en cuanto a los nutrientes mencionados en la etiqueta.  De compras  · Evite comprar alimentos procesados, enlatados o prehechos. Estos alimentos tienden a tener mayor cantidad de grasa, sodio y azúcar agregada.  · Compre en la zona exterior de la tienda de comestibles. Esta incluye frutas y vegetales frescos, granos a granel, carnes frescas y productos lácteos frescos.  Cocción  · Utilice métodos de cocción a baja temperatura, como hornear, en lugar de métodos de cocción a alta temperatura, como freír en abundante aceite.  · Cocine con aceites saludables, como el aceite de oliva, canola o girasol.  · Evite cocinar con manteca, crema o carnes con alto contenido de grasa.  Planificación de las comidas  · Consuma las comidas y las colaciones de forma regular, preferentemente a la misma hora todos los días. Evite pasar largos períodos de tiempo sin comer.  · Consuma alimentos ricos en fibra, como frutas frescas, verduras, frijoles y cereales integrales. Consulte al nutricionista sobre cuántas porciones de carbohidratos puede consumir en cada comida.  · Consuma entre 4 y 6 onzas de proteínas magras por día, como carnes magras, pollo, pescado, huevos o tofu. 1 onza equivale a 1 onza de carne, pollo o pescado, 1 huevo, o 1/4 taza de tofu.  · Coma algunos alimentos por día que contengan grasas saludables, como aguacates, frutos secos, semillas y pescado.  Estilo de vida     · Controle su nivel de glucemia con regularidad.  · Haga ejercicio al menos 30 minutos, 5 días o más por semana, o como se lo haya indicado el médico.  · Tome los medicamentos como se lo haya indicado el médico.  · No consuma ningún producto que contenga nicotina o tabaco, como cigarrillos y cigarrillos electrónicos. Si necesita ayuda para dejar de fumar, consulte al médico.  · Trabaje con un asesor o instructor en diabetes para identificar estrategias para controlar el estrés y cualquier desafío emocional y social.  ¿Cuáles son algunas de las preguntas que puedo hacerle a mi médico?  · ¿Es necesario que me reúna con un instructor en diabetes?  · ¿Es necesario que me reúna con un nutricionista?  · ¿A qué número puedo llamar si tengo preguntas?  · ¿Cuáles son los mejores momentos para controlar la glucemia?  Dónde encontrar más información:  · Asociación Americana de la Diabetes (American Diabetes Association): diabetes.org/food-and-fitness/food  · Academia de Nutrición y Dietética (Academy of Nutrition and Dietetics): www.eatright.org/resources/health/diseases-and-conditions/diabetes  · Instituto Nacional de la Diabetes y las Enfermedades Digestivas y Renales (National Institute of Diabetes and Digestive and Kidney Diseases) (Institutos Nacionales de Salud, NIH): www.niddk.nih.gov/health-information/diabetes/overview/diet-eating-physical-activity  Resumen  · Un plan de alimentación saludable   lo ayudará a controlar la glucemia y mantener un estilo de vida saludable.  · Trabajar con un especialista en dietas y nutrición (nutricionista) puede ayudarlo a elaborar el mejor plan de alimentación para usted.  · Tenga en cuenta que los carbohidratos y el alcohol tienen efectos inmediatos en sus niveles de glucemia. Es importante contar los carbohidratos y consumir alcohol con prudencia.  Esta información no tiene como fin reemplazar el consejo del médico. Asegúrese de hacerle al médico cualquier pregunta que tenga.   Document Released: 11/25/2007 Document Revised: 12/08/2016 Document Reviewed: 12/08/2016  Elsevier Interactive Patient Education © 2018 [employer]
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
## 2018-01-09 LAB — CBC WITH DIFFERENTIAL/PLATELET
BASOS: 0 %
Basophils Absolute: 0 10*3/uL (ref 0.0–0.2)
EOS (ABSOLUTE): 0.3 10*3/uL (ref 0.0–0.4)
EOS: 5 %
HEMATOCRIT: 42.3 % (ref 37.5–51.0)
Hemoglobin: 14 g/dL (ref 13.0–17.7)
Immature Grans (Abs): 0 10*3/uL (ref 0.0–0.1)
Immature Granulocytes: 0 %
Lymphocytes Absolute: 3.9 10*3/uL — ABNORMAL HIGH (ref 0.7–3.1)
Lymphs: 53 %
MCH: 30.4 pg (ref 26.6–33.0)
MCHC: 33.1 g/dL (ref 31.5–35.7)
MCV: 92 fL (ref 79–97)
MONOS ABS: 0.6 10*3/uL (ref 0.1–0.9)
Monocytes: 8 %
NEUTROS ABS: 2.5 10*3/uL (ref 1.4–7.0)
Neutrophils: 34 %
Platelets: 283 10*3/uL (ref 150–379)
RBC: 4.6 x10E6/uL (ref 4.14–5.80)
RDW: 13.5 % (ref 12.3–15.4)
WBC: 7.3 10*3/uL (ref 3.4–10.8)

## 2018-01-09 LAB — PSA, TOTAL AND FREE
PSA, Free Pct: 25 %
PSA, Free: 0.1 ng/mL
Prostate Specific Ag, Serum: 0.4 ng/mL (ref 0.0–4.0)

## 2018-01-11 MED FILL — IBUPROFEN 600 MG TABLET: 600 | 20 days supply | Qty: 60 | Fill #0

## 2018-01-12 MED FILL — OMEPRAZOLE DR 40 MG CAPSULE: 40 | 30 days supply | Qty: 30 | Fill #0

## 2018-01-13 ENCOUNTER — Telehealth: Payer: Self-pay

## 2018-01-13 NOTE — Telephone Encounter (Signed)
-----   Message from [place] sent at 01/13/2018 10:14 AM EDT -----   ----- Message ----- From: Charlott Rakes, MD Sent: 01/11/2018   1:25 PM To: Gomez Cleverly, CMA  Please inform the patient that labs are normal. Thank you.

## 2018-01-13 NOTE — Telephone Encounter (Signed)
CMA call regarding lab results   Patient did not answer but CMA left a VM stating the reason of the call &  to call back

## 2018-01-26 MED FILL — ROSUVASTATIN CALCIUM 10 MG: 10 | 30 days supply | Qty: 30 | Fill #1

## 2018-01-26 MED FILL — GLIMEPIRIDE 4 MG TABS: 4 | 30 days supply | Qty: 30 | Fill #0

## 2018-01-26 MED FILL — LISINOPRIL 5 MG TAB: 5 | 30 days supply | Qty: 30 | Fill #3

## 2018-01-27 MED FILL — TESTOSTERONE CYP 200 MG/ML: 200 | 28 days supply | Qty: 1 | Fill #4

## 2018-02-05 ENCOUNTER — Ambulatory Visit: Payer: Self-pay | Attending: Family Medicine | Admitting: *Deleted

## 2018-02-05 VITALS — Temp 98.2°F

## 2018-02-05 DIAGNOSIS — E349 Endocrine disorder, unspecified: Secondary | ICD-10-CM | POA: Insufficient documentation

## 2018-02-05 NOTE — Progress Notes (Signed)
Patient received injection and preferred the right dorso-gluteal. Patient tolerated injection well and advised to return in 28 days for next dosage.

## 2018-02-05 NOTE — Patient Instructions (Signed)
Please return in 28 days for next injection

## 2018-02-08 MED FILL — OMEPRAZOLE DR 40 MG CAPSULE: 40 | 30 days supply | Qty: 30 | Fill #1

## 2018-02-24 MED FILL — LISINOPRIL 5 MG TAB: 5 | 30 days supply | Qty: 30 | Fill #1

## 2018-02-24 MED FILL — GLIMEPIRIDE 4 MG TABLET: 4 | 30 days supply | Qty: 30 | Fill #1

## 2018-02-24 MED FILL — ROSUVASTATIN CALCIUM 10 MG: 10 | 30 days supply | Qty: 30 | Fill #2

## 2018-02-25 MED FILL — TESTOSTERONE CYP 200 MG/ML: 200 | 28 days supply | Qty: 1 | Fill #5

## 2018-02-26 ENCOUNTER — Ambulatory Visit: Payer: Self-pay

## 2018-03-05 ENCOUNTER — Ambulatory Visit: Payer: Self-pay

## 2018-03-08 MED FILL — OMEPRAZOLE DR 40 MG CAPSULE: 40 | 30 days supply | Qty: 30 | Fill #2

## 2018-03-08 MED FILL — TRUEplus LANCETS 28G MISC: 30 days supply | Qty: 100 | Fill #2

## 2018-03-08 MED FILL — TRUE METRIX TEST STRIP: 30 days supply | Qty: 100 | Fill #2

## 2018-03-12 ENCOUNTER — Ambulatory Visit (HOSPITAL_COMMUNITY)
Admission: RE | Admit: 2018-03-12 | Discharge: 2018-03-12 | Disposition: A | Discharge status: Home / Self Care | Payer: Self-pay | Source: Ambulatory Visit | Attending: Family Medicine | Admitting: Family Medicine

## 2018-03-12 ENCOUNTER — Ambulatory Visit: Payer: Self-pay | Attending: Family Medicine | Admitting: Family Medicine

## 2018-03-12 VITALS — BP 104/65 | HR 68 | Temp 98.2°F | Resp 18 | Ht 65.0 in | Wt 180.0 lb

## 2018-03-12 DIAGNOSIS — E785 Hyperlipidemia, unspecified: Secondary | ICD-10-CM | POA: Insufficient documentation

## 2018-03-12 DIAGNOSIS — M792 Neuralgia and neuritis, unspecified: Secondary | ICD-10-CM

## 2018-03-12 DIAGNOSIS — I10 Essential (primary) hypertension: Secondary | ICD-10-CM | POA: Insufficient documentation

## 2018-03-12 DIAGNOSIS — Z79890 Hormone replacement therapy (postmenopausal): Secondary | ICD-10-CM | POA: Insufficient documentation

## 2018-03-12 DIAGNOSIS — Z7984 Long term (current) use of oral hypoglycemic drugs: Secondary | ICD-10-CM | POA: Insufficient documentation

## 2018-03-12 DIAGNOSIS — M25552 Pain in left hip: Secondary | ICD-10-CM | POA: Insufficient documentation

## 2018-03-12 DIAGNOSIS — Z79899 Other long term (current) drug therapy: Secondary | ICD-10-CM | POA: Insufficient documentation

## 2018-03-12 DIAGNOSIS — M5136 Other intervertebral disc degeneration, lumbar region: Secondary | ICD-10-CM | POA: Insufficient documentation

## 2018-03-12 DIAGNOSIS — K219 Gastro-esophageal reflux disease without esophagitis: Secondary | ICD-10-CM | POA: Insufficient documentation

## 2018-03-12 DIAGNOSIS — E1100 Type 2 diabetes mellitus with hyperosmolarity without nonketotic hyperglycemic-hyperosmolar coma (NKHHC): Secondary | ICD-10-CM | POA: Insufficient documentation

## 2018-03-12 DIAGNOSIS — E1140 Type 2 diabetes mellitus with diabetic neuropathy, unspecified: Secondary | ICD-10-CM | POA: Insufficient documentation

## 2018-03-12 LAB — GLUCOSE, POCT (MANUAL RESULT ENTRY): POC Glucose: 86 mg/dl (ref 70–99)

## 2018-03-12 MED ORDER — MELOXICAM 15 MG PO TABS: 15.0000 mg | ORAL_TABLET | Freq: Every day | ORAL | 0 refills | Status: DC

## 2018-03-12 MED ORDER — PREGABALIN 100 MG PO CAPS: 100.0000 mg | ORAL_CAPSULE | Freq: Two times a day (BID) | ORAL | 1 refills | Status: DC

## 2018-03-12 MED FILL — MELOXICAM 15 MG TABLET: 15 | 30 days supply | Qty: 30 | Fill #0

## 2018-03-12 NOTE — Patient Instructions (Addendum)
For hip pain, I am starting you on Meloxicam 15 mg as needed daily. I have ordered an x-ray which you can obtain today at [facility]. I have refilled your Lyrica for neuropathic pain. Take as directed. Your blood sugar is  good today.   Para el dolor en la cadera, le comenzar a tomar Meloxicam 15 mg segn sea necesario diariamente. He ordenado una radiografa que puede obtener hoy en [facility]. He rellenado tu Lyrica para el dolor neuroptico. Tomar segn las indicaciones. Tu azcar en la sangre es bueno hoy.     Dolor de cadera Hip Pain La cadera es la articulacin entre la parte superior de las piernas y la parte inferior de la pelvis. Los [employer], Charity fundraiser, los tendones y los msculos de la articulacin de la cadera sostienen el peso del cuerpo y permiten el desplazamiento. El Social research officer, government de cadera puede tener distintos grados, desde un dolor leve hasta un dolor intenso en uno o ambos lados de la cadera. El dolor puede sentirse en la parte interna de la articulacin de la cadera, cerca de la ingle, o en la parte externa, cerca de las nalgas y la parte superior de los muslos. Tambin puede estar acompaado de hinchazn o entumecimiento. Siga estas instrucciones en su casa: Control del dolor, de la rigidez y de la hinchazn  Si se lo indican, aplique hielo sobre la zona lesionada. ? Ponga el hielo en una bolsa plstica. ? Coloque una [employer] piel y la bolsa de hielo. ? Deje el hielo durante 20 minutos, y aplquelo de 2 a 3 veces por Training and development officer.  Duerma con una almohada entre las piernas del lado que le sea ms cmodo.  Evite las [employer] Group causen [employer] Corporation. Instrucciones generales  [employer] de venta libre y los recetados solamente como se lo haya indicado el mdico.  Haga ejercicios como se lo haya indicado el mdico.  Registre la siguiente informacin: ? Con qu frecuencia le duele la cadera. ? La ubicacin del dolor. ? Cmo se siente el  dolor. ? Qu es lo que hace que el dolor empeore.  Concurra a todas las visitas de control como se lo haya indicado el mdico. Esto es importante. Comunquese con un mdico si:  No puede apoyar el peso del cuerpo [employer].  El dolor o la hinchazn continan o empeoran despus de [place].  Tiene ms dificultades para caminar.  Tiene fiebre. Solicite ayuda de inmediato si:  Se cae.  El dolor y la hinchazn de la cadera aumentan de repente.  La cadera est enrojecida o hinchada, o muy dolorida con la palpacin. Resumen  El dolor de cadera puede tener distintos grados, desde un dolor leve hasta un dolor intenso en uno o ambos lados de la cadera.  El dolor puede sentirse en la parte interna de la articulacin de la cadera, cerca de la ingle, o en la parte externa, cerca de las nalgas y la parte superior de los muslos.  Evite las [employer] causen [employer] Corporation.  Anote la frecuencia con la que tiene dolor en la cadera, la ubicacin del dolor, qu es lo que hace que el dolor empeore y lo que siente. Esta informacin no tiene Marine scientist el consejo del mdico. Asegrese de hacerle al mdico cualquier pregunta que tenga. Document Released: 01/02/2014 Document Revised: 11/21/2016 Document Reviewed: 11/21/2016 Elsevier Interactive Patient Education  2018 [place] perifrica (Peripheral Neuropathy) La neuropata perifrica es  un tipo de lesin nerviosa. Afecta a los nervios que transportan seales entre la mdula espinal y otras partes del cuerpo. Estos se denominan nervios perifricos. Con la neuropata perifrica, es posible que haya lesiones en un nervio o en un grupo de nervios. [place] lesiones en los nervios perifricos pueden ser [place]. En algunas personas se desconoce la causa de la neuropata perifrica. Algunas causas son:  Diabetes. Esta es la causa ms comn de la neuropata perifrica.  Lesin en un nervio.  Presin  o tensin duraderas en un nervio.  [place] puede ser el alcoholismo.  Infecciones.  Enfermedades autoinmunitarias, como la esclerosis mltiple y el lupus eritematoso sistmico.  Enfermedades nerviosas hereditarias.  Algunos medicamentos, como los medicamentos para Science writer.  Sustancias txicas, como el plomo y [place].  Flujo deficiente de [employer].  Enfermedades renales.  Enfermedad tiroidea. [place]. Los sntomas que tenga dependern del tipo de nervio que se haya lesionado. Los sntomas ms comunes son:  Prdida de la sensibilidad (adormecimiento) en los pies y [place].  Hormigueo en los pies y las manos.  Dolor y ardor.  Piel muy sensible.  Debilidad.  Imposibilidad para mover partes del cuerpo (parlisis).  Fasciculaciones (temblores musculares).  Torpeza o coordinacin deficiente.  Prdida del equilibrio.  Dificultad para controlar la vejiga.  Sensacin de Enterprise Products.  Problemas sexuales. DIAGNSTICO La neuropata perifrica es un sntoma, no una enfermedad. Puede ser difcil encontrar la causa de la neuropata perifrica. Para averiguarlo, el mdico le har una historia clnica y un examen fsico. Tambin se realizar un examen neurolgico. Esto involucra controlar aspectos que puedan verse afectados por el cerebro, la mdula espinal y los nervios (sistema nervioso). Por ejemplo, el mdico controlar sus reflejos, cmo se mueve y su grado de sensibilidad. Posiblemente le realicen otros tipos de pruebas, como las siguientes:  Anlisis de [place].  Una prueba del lquido de la mdula espinal.  Pruebas de diagnstico por imagen, como tomografas computarizadas (TC) o resonancias magnticas (RM).  Electromiograma (EMG). Esta prueba evala los nervios que controlan los msculos.  Pruebas de velocidad de conduccin nerviosa. Estas pruebas evalan la velocidad con la  que los mensajes pasan a travs de los nervios.  Biopsia del nervio. Se extirpa un pedazo pequeo del nervio, que luego se examina con el microscopio. TRATAMIENTO  Con frecuencia, la neuropata perifrica se trata con medicamentos, entre los que se Verizon siguientes: ? Analgsicos. Se pueden sugerir medicamentos recetados o de [employer]G Corporation. ? Medicamentos anticonvulsivos. Estos posiblemente se usen para Conservation officer, historic buildings. ? Antidepresivos. Estos tambin pueden ayudar a Best boy provocado por la neuropata. ? Lidocana. Este es un anestsico. Puede usar un parche o recibir una dosis. ? Mexiletina. Este medicamento se [place] normalmente para ayudar a Chief Technology Officer los ritmos cardacos irregulares.  Ciruga. Es posible que la ciruga sea necesaria para Public house manager la presin en un nervio o para destruir un nervio que est provocando dolor.  La fisioterapia ayuda al movimiento.  Dispositivos de asistencia que ayuden al [employer].  [place] solo medicamentos de venta libre o recetados, segn las indicaciones del mdico. Siga cuidadosamente las instrucciones de Environmental manager. No tome ningn otro medicamento sin obtener primero la aprobacin del mdico.  Si tiene diabetes, trabaje estrechamente con el mdico para mantener bajo control el nivel de Museum/gallery exhibitions officer.  Si siente adormecimiento en los  pies, haga lo siguiente: ? Realice controles todos los das para detectar signos de lesin o infeccin. Observe seales de enrojecimiento, calor e hinchazn. ? Use calcetines acolchados y zapatos cmodos. Estos ayudan a [employer].  No realice actividades que ejerzan presin sobre el nervio lesionado.  No fume. Fumar evita que la sangre llegue a los nervios lesionados.  Evite o limite el consumo alcohol. Demasiado alcohol puede provocar una falta de vitaminas B. Estas vitaminas son necesarias para tener nervios saludables.  Desarrolle un buen sistema  de apoyo. Hacerle frente a la neuropata perifrica puede ser estresante. Hable con un profesional de la salud mental o nase a un grupo de apoyo si le cuesta enfrentar la situacin.  Concurra a las consultas de control con su mdico segn las indicaciones.  SOLICITE ATENCIN MDICA SI:  Advierte signos o sntomas nuevos de la neuropata perifrica.  Le est costando lidiar con la neuropata perifrica desde el punto de vista emocional.  Tiene fiebre.  SOLICITE ATENCIN MDICA DE INMEDIATO SI:  Tiene una lesin o infeccin que no se cura.  Se siente muy mareado o comienza a vomitar.  Siente dolor en el pecho.  Tiene dificultad para respirar.  Esta informacin no tiene Marine scientist el consejo del mdico. Asegrese de hacerle al mdico cualquier pregunta que tenga. Document Released: 12/04/2008 Document Revised: 12/10/2015 Document Reviewed: 04/25/2013 Elsevier Interactive Patient Education  2017 [employer].

## 2018-03-12 NOTE — Progress Notes (Signed)
Patient ID: Michael Campos, male    DOB: Mar 21, 1951, 67 y.o.   MRN: [id number]  PCP: Charlott Rakes, MD  Chief Complaint  Patient presents with  . Pain    follow-up chronic conditions     Subjective:  HPI [id number] Michael Campos is a 67 y.o. male presents for evaluation of  bilateral lower extremity numbness and tingling of feet and left hip pain. He was previously prescribed Lyrica for post herpetic pain however, medication was effective in relief of neuropathy symptoms, however he is out of medication. He complains today of left sided hip pain which comes and goes. He has a history of lumbar disease with known bulging disk. He denies back pain and doesn't feel symptoms are originating from his back. He denies lower extremity weakness or that pain is impacting ADL.  Social History   Socioeconomic History  . Marital status: Married    Spouse name: Not on file  . Number of children: Not on file  . Years of education: Not on file  . Highest education level: Not on file  Occupational History  . Not on file  Social Needs  . Financial resource strain: Not on file  . Food insecurity:    Worry: Not on file    Inability: Not on file  . Transportation needs:    Medical: Not on file    Non-medical: Not on file  Tobacco Use  . Smoking status: Never Smoker  . Smokeless tobacco: Never Used  Substance and Sexual Activity  . Alcohol use: No  . Drug use: No  . Sexual activity: Yes  Lifestyle  . Physical activity:    Days per week: Not on file    Minutes per session: Not on file  . Stress: Not on file  Relationships  . Social connections:    Talks on phone: Not on file    Gets together: Not on file    Attends religious service: Not on file    Active member of club or organization: Not on file    Attends meetings of clubs or organizations: Not on file    Relationship status: Not on file  . Intimate partner violence:    Fear of current or ex partner: Not on  file    Emotionally abused: Not on file    Physically abused: Not on file    Forced sexual activity: Not on file  Other Topics Concern  . Not on file  Social History Narrative  . Not on file    History reviewed. No pertinent family history.  Review of Systems Pertinent negatives listed in HPI  Patient Active Problem List   Diagnosis Date Noted  . Post herpetic neuralgia 06/23/2017  . Lumbar radiculopathy 04/29/2017  . Bulging lumbar disc 04/29/2017  . Diabetic neuropathy ([place]) 03/24/2017  . Testosterone deficiency 02/11/2017  . Trigger finger of left hand 03/06/2014  . Bilateral hand pain 03/06/2014  . HTN (hypertension) 11/14/2013  . Dyslipidemia 11/14/2013  . GERD (gastroesophageal reflux disease) 11/14/2013  . Diabetes ([place]) 04/05/2013  . Neuropathic pain of hand 04/05/2013  . Physical exam, annual 04/05/2013  . Inguinal hernia without mention of obstruction or gangrene, unilateral or unspecified, (not specified as recurrent) 12/16/2011    No Known Allergies  Prior to Admission medications   Medication Sig Start Date End Date Taking? Authorizing Provider  Blood Glucose Monitoring Suppl (TRUE METRIX METER) DEVI 1 each by Does not apply route 3 (three) times daily before meals. 06/30/16  Charlott Rakes, MD  glimepiride (AMARYL) 4 MG tablet Take 1 tablet (4 mg total) by mouth daily before breakfast. 01/08/18   Charlott Rakes, MD  glucose blood test strip Use 3 times daily before meals 07/02/17   Tresa Garter, MD  HYDROcodone-acetaminophen (NORCO) 5-325 MG tablet Take 1 tablet by mouth every 6 (six) hours as needed for moderate pain. Patient not taking: Reported on 01/08/2018 11/05/17   Robyn Haber, MD  ibuprofen (ADVIL,MOTRIN) 600 MG tablet Take 1 tablet (600 mg total) by mouth every 8 (eight) hours as needed. 01/08/18   Charlott Rakes, MD  lisinopril (PRINIVIL,ZESTRIL) 5 MG tablet Take 1 tablet (5 mg total) by mouth daily. 01/08/18   Charlott Rakes, MD   methocarbamol (ROBAXIN-750) 750 MG tablet Take 1 tablet (750 mg total) by mouth 2 (two) times daily as needed for muscle spasms. Patient not taking: Reported on 01/08/2018 09/23/17   Charlott Rakes, MD  Multiple Vitamins-Minerals (MULTIVITAMIN ADULT PO) Take 1 tablet by mouth daily.    [provider]  omeprazole (PRILOSEC) 40 MG capsule Take 1 capsule (40 mg total) by mouth daily. 01/08/18   Charlott Rakes, MD  pregabalin (LYRICA) 100 MG capsule Take 1 capsule (100 mg total) by mouth 2 (two) times daily. 12/07/17   Charlott Rakes, MD  rosuvastatin (CRESTOR) 10 MG tablet Take 1 tablet (10 mg total) by mouth daily. 01/08/18   Charlott Rakes, MD  testosterone cypionate (DEPOTESTOSTERONE CYPIONATE) 200 MG/ML injection INJECT 0.5 MLS (100MG  TOTAL) INTO THE MUSCLE ONCE EVERY 28 DAYS. 09/22/17   Charlott Rakes, MD  TRUEPLUS LANCETS 28G MISC USE 3 TIMES DAILY BEFORE MEALS. 07/02/17   Tresa Garter, MD  vitamin B-12 (CYANOCOBALAMIN) 1000 MCG tablet Take 1,000 mcg by mouth daily.    [provider]    Past Medical, Surgical Family and Social History reviewed and updated.    Objective:   Today's Vitals   03/12/18 1356  BP: 104/65  Pulse: 68  Resp: 18  Temp: 98.2 F (36.8 C)  TempSrc: Oral  SpO2: 100%  Weight: 180 lb (81.6 kg)  Height: 5\' 5"  (1.651 m)  PainSc: 0-No pain    Wt Readings from Last 3 Encounters:  03/12/18 180 lb (81.6 kg)  01/08/18 182 lb 12.8 oz (82.9 kg)  09/23/17 187 lb 12.8 oz (85.2 kg)    Physical Exam Constitutional: Patient appears well-developed and well-nourished. No distress. Neck: Normal ROM. Neck supple. No JVD. No tracheal deviation. No thyromegaly. CVS: RRR, S1/S2 +, no murmurs, no gallops, no carotid bruit.  Pulmonary: Effort and breath sounds normal, no stridor, rhonchi, wheezes, rales.  Abdominal: Soft. BS +, no distension, tenderness, rebound or guarding.  Musculoskeletal: Normal range of motion. No edema and no tenderness.   Neuro: Alert. Normal reflexes, muscle tone coordination. No cranial nerve deficit. Skin: Skin is warm and dry. No rash noted. Not diaphoretic. No erythema. No pallor. Psychiatric: Normal mood and affect. Behavior, judgment, thought content normal.   Assessment & Plan:  1. Type 2 diabetes mellitus with hyperosmolarity without coma, without long-term current use of insulin (HCC), - Glucose (CBG) 86. Controlled, last A1C 6.8. No changes in therapy today.  2. Neuropathic pain, will refill Lyrica. He was previously treated with Lyrica for postherpetic shingle pain and medication was effective in relieving foot pain secondary to neuropathy. Education provided regarding side effects of medication.  3. Left hip pain, suspect possible arthritis. Will obtain a x-ray of left hip. For pain, Lyrica should help. Will also trial  Meloxicam. If imaging is abnormal will refer to orthopedic for further evaluation.   Meds ordered this encounter  Medications  . pregabalin (LYRICA) 100 MG capsule    Sig: Take 1 capsule (100 mg total) by mouth 2 (two) times daily.    Dispense:  180 capsule    Refill:  1  . meloxicam (MOBIC) 15 MG tablet    Sig: Take 1 tablet (15 mg total) by mouth daily.    Dispense:  30 tablet    Refill:  0     Carroll Sage. Kenton Kingfisher, MSN, [facility] and [address], [place], [place] [address] [phone]

## 2018-03-14 ENCOUNTER — Telehealth: Payer: Self-pay | Admitting: Family Medicine

## 2018-03-14 NOTE — Telephone Encounter (Signed)
Please contact patient to advise his recent imaging of left hip was negative of any abnormal findings. 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 He is to continue Lyrica and Meloxicam. I'll be happy to place a referral to physical therapy if symptoms worse or he feels PT could improve his symptoms.  Carroll Sage. Kenton Kingfisher, MSN, [facility] and [address], [place], [place] [address] [phone]

## 2018-03-23 NOTE — Telephone Encounter (Signed)
Medical Assistant used [employer] to contact patient.  Interpreter Name: Derald Macleod Interpreter #: [id number] Patient was not available, [employer] left patient a voicemail. Voicemail states to give a call back to [place] with [facility] at [phone]. MA reached patient on mobile line. Patient verified DOB Patient is aware of no abnormalities being noted in the left hip imaging. Patient is aware of chronic arthritis being noted which is consistent with prior imaging. Patient advised to continue with Lyrica and Meloxicam. Patient is aware of PT being utilized if his symptoms do not approve upon his next visit.

## 2018-03-29 ENCOUNTER — Other Ambulatory Visit: Payer: Self-pay | Admitting: Family Medicine

## 2018-03-29 MED FILL — ROSUVASTATIN CALCIUM 10 MG: 10 | 30 days supply | Qty: 30 | Fill #3

## 2018-03-29 MED FILL — LISINOPRIL 5 MG TAB: 5 | 30 days supply | Qty: 30 | Fill #2

## 2018-03-29 MED FILL — ?GLIMEPIRIDE 4 MG TABLET: 4 | 30 days supply | Qty: 30 | Fill #2

## 2018-03-29 NOTE — Telephone Encounter (Signed)
Refills for this medication must be sent via PCP. Will forward information to PCP.

## 2018-04-06 MED FILL — OMEPRAZOLE DR 40 MG CAPSULE: 40 | 30 days supply | Qty: 30 | Fill #3

## 2018-04-19 ENCOUNTER — Ambulatory Visit: Payer: Self-pay | Admitting: Family Medicine

## 2018-04-20 ENCOUNTER — Ambulatory Visit: Payer: Self-pay | Admitting: Family Medicine

## 2018-04-22 ENCOUNTER — Encounter

## 2018-04-22 ENCOUNTER — Encounter: Payer: Self-pay | Admitting: Family Medicine

## 2018-04-22 ENCOUNTER — Ambulatory Visit: Payer: Self-pay | Attending: Family Medicine | Admitting: Family Medicine

## 2018-04-22 VITALS — BP 139/79 | HR 61 | Temp 98.1°F | Ht 65.0 in | Wt 182.0 lb

## 2018-04-22 DIAGNOSIS — K219 Gastro-esophageal reflux disease without esophagitis: Secondary | ICD-10-CM | POA: Insufficient documentation

## 2018-04-22 DIAGNOSIS — Z791 Long term (current) use of non-steroidal anti-inflammatories (NSAID): Secondary | ICD-10-CM | POA: Insufficient documentation

## 2018-04-22 DIAGNOSIS — M5431 Sciatica, right side: Secondary | ICD-10-CM | POA: Insufficient documentation

## 2018-04-22 DIAGNOSIS — I10 Essential (primary) hypertension: Secondary | ICD-10-CM | POA: Insufficient documentation

## 2018-04-22 DIAGNOSIS — R1031 Right lower quadrant pain: Secondary | ICD-10-CM | POA: Insufficient documentation

## 2018-04-22 DIAGNOSIS — E349 Endocrine disorder, unspecified: Secondary | ICD-10-CM

## 2018-04-22 DIAGNOSIS — E1100 Type 2 diabetes mellitus with hyperosmolarity without nonketotic hyperglycemic-hyperosmolar coma (NKHHC): Secondary | ICD-10-CM

## 2018-04-22 DIAGNOSIS — M5432 Sciatica, left side: Secondary | ICD-10-CM | POA: Insufficient documentation

## 2018-04-22 DIAGNOSIS — Z79899 Other long term (current) drug therapy: Secondary | ICD-10-CM | POA: Insufficient documentation

## 2018-04-22 DIAGNOSIS — E785 Hyperlipidemia, unspecified: Secondary | ICD-10-CM | POA: Insufficient documentation

## 2018-04-22 DIAGNOSIS — Z7984 Long term (current) use of oral hypoglycemic drugs: Secondary | ICD-10-CM | POA: Insufficient documentation

## 2018-04-22 DIAGNOSIS — E119 Type 2 diabetes mellitus without complications: Secondary | ICD-10-CM | POA: Insufficient documentation

## 2018-04-22 DIAGNOSIS — E291 Testicular hypofunction: Secondary | ICD-10-CM | POA: Insufficient documentation

## 2018-04-22 LAB — POCT GLYCOSYLATED HEMOGLOBIN (HGB A1C): HbA1c, POC (controlled diabetic range): 6.6 % (ref 0.0–7.0)

## 2018-04-22 LAB — GLUCOSE, POCT (MANUAL RESULT ENTRY): POC Glucose: 112 mg/dl — AB (ref 70–99)

## 2018-04-22 MED ORDER — TESTOSTERONE CYPIONATE 200 MG/ML IM SOLN: INTRAMUSCULAR | 5 refills | Status: DC

## 2018-04-22 MED ORDER — ROSUVASTATIN CALCIUM 10 MG PO TABS: 10.0000 mg | ORAL_TABLET | Freq: Every day | ORAL | 3 refills | Status: DC

## 2018-04-22 MED ORDER — LISINOPRIL 5 MG PO TABS: 5.0000 mg | ORAL_TABLET | Freq: Every day | ORAL | 3 refills | Status: DC

## 2018-04-22 MED ORDER — OMEPRAZOLE 40 MG PO CPDR: 40.0000 mg | DELAYED_RELEASE_CAPSULE | Freq: Every day | ORAL | 3 refills | Status: DC

## 2018-04-22 MED ORDER — GLIMEPIRIDE 4 MG PO TABS: 4.0000 mg | ORAL_TABLET | Freq: Every day | ORAL | 3 refills | Status: DC

## 2018-04-22 MED ORDER — TESTOSTERONE CYPIONATE 200 MG/ML IM SOLN
100.0000 mg | Freq: Once | INTRAMUSCULAR | Status: AC
  Administered 2018-04-22: 100 mg via INTRAMUSCULAR

## 2018-04-22 MED ORDER — TIZANIDINE HCL 4 MG PO TABS: 4.0000 mg | ORAL_TABLET | Freq: Four times a day (QID) | ORAL | 1 refills | Status: DC | PRN

## 2018-04-22 MED ORDER — MELOXICAM 15 MG PO TABS: 15.0000 mg | ORAL_TABLET | Freq: Every day | ORAL | 1 refills | Status: DC

## 2018-04-22 MED FILL — ROSUVASTATIN CALCIUM 10 MG: 10 | 30 days supply | Qty: 30 | Fill #0

## 2018-04-22 MED FILL — LISINOPRIL 5 MG TAB: 5 | 30 days supply | Qty: 30 | Fill #3

## 2018-04-22 MED FILL — MELOXICAM 15 MG TABLET: 15 | 30 days supply | Qty: 30 | Fill #0

## 2018-04-22 MED FILL — tiZANidine HCL 4 MG TABS: 4 | 20 days supply | Qty: 60 | Fill #0

## 2018-04-22 MED FILL — TESTOSTERONE CYP 200 MG/ML: 200 | 28 days supply | Qty: 1 | Fill #0

## 2018-04-22 MED FILL — ?GLIMEPIRIDE 4 MG TABLET: 4 | 30 days supply | Qty: 30 | Fill #3

## 2018-04-22 NOTE — Progress Notes (Signed)
Patient is having pain in  Both legs.  Patient is also having pain in groin area.

## 2018-04-22 NOTE — Patient Instructions (Signed)
Ciática  Sciatica  La ciática es el dolor, entumecimiento, debilidad u hormigueo a lo largo del nervio ciático. El nervio ciático comienza en la parte inferior de la espalda y desciende por la parte posterior de cada pierna. Controla los músculos en la parte inferior de las piernas y en la parte posterior de las rodillas. También proporciona sensibilidad a la parte posterior de los muslos, la parte inferior de las piernas y la planta de los pies. La ciática es un síntoma de otra afección que ejerce presión o “pellizca” el nervio ciático.  Generalmente la ciática afecta solo a un lado del cuerpo. Suele desaparecer por sí sola o con tratamiento. En algunos casos, la ciática puede volver a aparecer (ser recurrente).  ¿Cuáles son las causas?  Esta afección causa presión sobre el nervio ciático o lo “pellizca”. Esto puede ser el resultado de:  · Un disco que sobresale demasiado (hernia de disco) entre los huesos de la columna vertebral (vértebras).  · Cambios relacionados con la edad en los discos de la columna vertebral (discopatía degenerativa).  · Un trastorno doloroso que afecta un músculo de las nalgas (síndrome piriforme).  · Un crecimiento óseo adicional (espolón óseo) cerca del nervio ciático.  · Una lesión o fractura de la pelvis.  · Embarazo.  · Tumor (poco frecuente).    ¿Qué incrementa el riesgo?  Los siguientes factores pueden hacer que usted sea más propenso a tener esta enfermedad:  · Practicar deportes en los que se ejerce presión sobre la columna vertebral o en los que la columna realiza mucho esfuerzo, como el fútbol americano o el levantamiento de pesas.  · Tener poca fuerza y flexibilidad.  · Antecedentes médicos de lesiones en la espalda.  · Antecedentes médicos de cirugía en la espalda.  · Estar sentado durante largos períodos de tiempo.  · Realizar actividades que requieren agacharse o levantar objetos en forma repetida.  · Obesidad.    ¿Cuáles son los signos o los síntomas?  Los síntomas pueden  ser leves o graves, y pueden incluir los siguientes:  · Cualquiera de los siguientes problemas en la parte inferior de la espalda, piernas, cadera o nalgas:  ? Hormigueo leve o dolor sordo.  ? Sensación de ardor.  ? Dolor agudo.  · Adormecimiento de la parte posterior de la pantorrilla o la planta del pie.  · Debilidad en las piernas.  · Dolor de espalda intenso que dificulta el movimiento.    Estos síntomas podrían empeorar al toser, estornudar o reírse, o cuando se está sentado o de pie durante períodos prolongados. El sobrepeso también puede empeorar los síntomas. En algunos casos, los síntomas regresan luego de un tiempo.  ¿Cómo se diagnostica?  Esta afección se puede diagnosticar en función de lo siguiente:  · Sus síntomas.  · Un examen físico. El médico podría indicarle que realice ciertos movimientos para controlar si estos desencadenan los síntomas.  · También pueden hacerle exámenes que incluyen lo siguiente:  ? Análisis de sangre.  ? Radiografías.  ? Resonancia magnética (RM).  ? Exploración por tomografía computarizada (TC).    ¿Cómo se trata?  En muchos casos, esta afección mejora por sí sola, sin ningún tratamiento. Sin embargo, el tratamiento puede incluir:  · Reducción o modificación de la actividad física en los períodos de dolor.  · Ejercicios y estiramiento para fortalecer el abdomen y mejorar la flexibilidad de la columna vertebral.  · Aplicación de calor o hielo en la zona afectada.  · Medicamentos para lo siguiente:  ?   Aliviar el dolor y la inflamación.  ? Relajar los músculos.  · Medicamentos inyectables que ayudan a aliviar el dolor, la irritación y la inflamación alrededor del nervio ciático (corticoesteroides).  · Cirugía.    Siga estas indicaciones en su casa:  Medicamentos  · Tome los medicamentos de venta libre y los recetados solamente como se lo haya indicado el médico.  · No conduzca ni opere maquinaria pesada mientras toma analgésicos recetados.  Control del dolor  · Si se lo indican,  aplique hielo en la zona afectada.  ? Ponga el hielo en una bolsa plástica.  ? Coloque una toalla entre la piel y la bolsa de hielo.  ? Coloque el hielo durante 20 minutos, 2 a 3 veces por día.  · Después del hielo, aplique calor sobre la zona afectada antes de realizar ejercicio o con la frecuencia que le haya indicado el médico. Use la fuente de calor que el médico le recomiende, como una compresa de calor húmedo o una almohadilla térmica.  ? Coloque una toalla entre la piel y la fuente de calor.  ? Aplique el calor durante 20 a 30 minutos.  ? Retire la fuente de calor si la piel se pone de color rojo brillante. Esto es muy importante si no puede sentir el dolor, el calor o el frío. Puede correr un riesgo mayor de sufrir quemaduras.  Actividad  · Retome sus actividades normales como se lo haya indicado el médico. Pregúntele al médico qué actividades son seguras para usted.  ? Evite las actividades que empeoran los síntomas.  · Durante el día, descanse durante lapsos breves. Descansar recostado o de pie suele ser mejor que hacerlo sentado.  ? Cuando descanse durante períodos más largos, incorpore alguna actividad suave o ejercicios de elongación entre los períodos de descanso. Esto ayudará a evitar la rigidez y el dolor.  ? Evite estar sentado durante largos períodos de tiempo sin moverse. Levántese y muévase al menos una vez cada hora.  · Haga ejercicio y elongue habitualmente, como se lo haya indicado el médico.  · No levante nada que pese más de 10 libras (4,5 kg) mientras tenga síntomas de ciática. Aunque no tenga síntomas, evite levantar objetos pesados, en especial en forma repetida.  · Siempre use las técnicas de levantamiento correctas para levantar objetos, entre ellas:  ? Flexionar las rodillas.  ? Mantener la carga cerca del cuerpo.  ? No torcerse.  Instrucciones generales  · Mantenga una buena postura.  ? Evite reclinarse hacia adelante cuando esté sentado.  ? Evite encorvar la espalda mientras esté de  pie.  · Mantenga un peso saludable. El exceso de peso ejerce presión adicional sobre la espalda y hace que resulte difícil mantener una buena postura.  · Use calzado con buen apoyo y cómodo. Evite usar tacones.  · Evite dormir sobre un colchón que sea demasiado blando o demasiado duro. Un colchón que ofrezca un apoyo suficientemente firme para su espalda al dormir puede ayudar a aliviar el dolor.  · Concurra a todas las visitas de seguimiento como se lo haya indicado el médico. Esto es importante.  Comuníquese con un médico si:  · El dolor lo despierta cuando está dormido.  · El dolor empeora cuando se acuesta.  · El dolor es peor del que experimentó en el pasado.  · Los síntomas duran más de 4 semanas.  · Pierde peso en forma inexplicable.  Solicite ayuda de inmediato si:  · Pierde el control de la vejiga o del intestino (incontinencia).  ·
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
## 2018-04-26 ENCOUNTER — Ambulatory Visit: Payer: Self-pay | Attending: Family Medicine

## 2018-04-26 DIAGNOSIS — E119 Type 2 diabetes mellitus without complications: Secondary | ICD-10-CM | POA: Insufficient documentation

## 2018-04-26 NOTE — Progress Notes (Signed)
Patient here for lab visit only 

## 2018-04-27 LAB — LIPID PANEL
CHOL/HDL RATIO: 4 ratio (ref 0.0–5.0)
Cholesterol, Total: 163 mg/dL (ref 100–199)
HDL: 41 mg/dL (ref >39)
LDL CALC: 75 mg/dL (ref 0–99)
Triglycerides: 236 mg/dL — ABNORMAL HIGH (ref 0–149)
VLDL CHOLESTEROL CAL: 47 mg/dL — AB (ref 5–40)

## 2018-05-06 MED FILL — OMEPRAZOLE DR 40 MG CAPSULE: 40 | 30 days supply | Qty: 30 | Fill #0

## 2018-05-20 ENCOUNTER — Ambulatory Visit: Payer: Self-pay | Attending: Family Medicine | Admitting: *Deleted

## 2018-05-20 DIAGNOSIS — E349 Endocrine disorder, unspecified: Secondary | ICD-10-CM | POA: Insufficient documentation

## 2018-05-20 MED ORDER — TESTOSTERONE CYPIONATE 100 MG/ML IM SOLN
100.0000 mg | INTRAMUSCULAR | Status: DC
  Administered 2018-05-20 – 2018-06-17 (×2): 100 mg via INTRAMUSCULAR

## 2018-05-20 NOTE — Progress Notes (Signed)
Patient here today for testosterone injection. Testosterone injection administered today in the right upper outer quadrant.  Site unremarkable & patient tolerated injection.  Next injection due in 28 days. Appointment made for 06/17/2018. Reminder card given. Carilyn Goodpasture, RN,BSN

## 2018-05-27 ENCOUNTER — Other Ambulatory Visit: Payer: Self-pay | Admitting: Family Medicine

## 2018-05-27 DIAGNOSIS — M5431 Sciatica, right side: Secondary | ICD-10-CM

## 2018-05-27 DIAGNOSIS — M5432 Sciatica, left side: Principal | ICD-10-CM

## 2018-05-27 MED FILL — ROSUVASTATIN CALCIUM 10 MG: 10 | 30 days supply | Qty: 30 | Fill #1

## 2018-05-27 MED FILL — LISINOPRIL 5 MG TAB: 5 | 30 days supply | Qty: 30 | Fill #0

## 2018-05-27 MED FILL — ?GLIMEPIRIDE 4 MG TABLET: 4 | 30 days supply | Qty: 30 | Fill #0

## 2018-05-27 MED FILL — MELOXICAM 15 MG TABLET: 15 | 30 days supply | Qty: 30 | Fill #1

## 2018-05-27 MED FILL — TRUE METRIX TEST STRIP: 30 days supply | Qty: 100 | Fill #3

## 2018-05-27 MED FILL — TESTOSTERONE CYP 200 MG/ML: 200 | 28 days supply | Qty: 1 | Fill #1

## 2018-06-07 MED FILL — OMEPRAZOLE DR 40 MG CAPSULE: 40 | 30 days supply | Qty: 30 | Fill #1

## 2018-06-15 ENCOUNTER — Other Ambulatory Visit: Payer: Self-pay | Admitting: Family Medicine

## 2018-06-15 DIAGNOSIS — M25552 Pain in left hip: Secondary | ICD-10-CM

## 2018-06-15 DIAGNOSIS — M792 Neuralgia and neuritis, unspecified: Secondary | ICD-10-CM

## 2018-06-15 MED ORDER — PREGABALIN 100 MG PO CAPS: 100.0000 mg | ORAL_CAPSULE | Freq: Two times a day (BID) | ORAL | 1 refills | Status: DC

## 2018-06-16 ENCOUNTER — Other Ambulatory Visit: Payer: Self-pay | Admitting: Family Medicine

## 2018-06-16 DIAGNOSIS — M792 Neuralgia and neuritis, unspecified: Secondary | ICD-10-CM

## 2018-06-16 DIAGNOSIS — M25552 Pain in left hip: Secondary | ICD-10-CM

## 2018-06-16 NOTE — Telephone Encounter (Signed)
Last script for Lyrica filled 03/26/18 for 90-day supply. Verified via PMP. Will forward to PCP for review.

## 2018-06-17 ENCOUNTER — Ambulatory Visit: Payer: Self-pay | Attending: Family Medicine | Admitting: *Deleted

## 2018-06-17 DIAGNOSIS — E349 Endocrine disorder, unspecified: Secondary | ICD-10-CM | POA: Insufficient documentation

## 2018-06-17 NOTE — Progress Notes (Signed)
[name] arrived today for injection. Testosterone injection was administered today in the right upper outer quadrant.  Site unremarkable & patient tolerated injection.  Next injection due in 28 days. Appointment made for 07/14/2018. Reminder card given. Carilyn Goodpasture, RN,BSN

## 2018-06-28 MED FILL — ?GLIMEPIRIDE 4 MG TABLET: 4 | 30 days supply | Qty: 30 | Fill #1

## 2018-06-28 MED FILL — LISINOPRIL 5 MG TABLET: 5 | 30 days supply | Qty: 30 | Fill #1

## 2018-06-28 MED FILL — ROSUVASTATIN CALCIUM 10 MG: 10 | 30 days supply | Qty: 30 | Fill #2

## 2018-07-05 MED FILL — OMEPRAZOLE DR 40 MG CAPSULE: 40 | 30 days supply | Qty: 30 | Fill #2

## 2018-07-14 ENCOUNTER — Ambulatory Visit: Payer: Self-pay

## 2018-07-19 ENCOUNTER — Ambulatory Visit: Payer: Self-pay | Attending: Family Medicine | Admitting: *Deleted

## 2018-07-19 DIAGNOSIS — E349 Endocrine disorder, unspecified: Secondary | ICD-10-CM | POA: Insufficient documentation

## 2018-07-19 MED ORDER — TESTOSTERONE CYPIONATE 100 MG/ML IM SOLN
100.0000 mg | INTRAMUSCULAR | Status: DC
  Administered 2018-08-16: 100 mg via INTRAMUSCULAR

## 2018-07-19 NOTE — Progress Notes (Signed)
Patient here today for testosterone injection. Injection was administered int the left upper outer quadrant.  Site unremarkable & patient tolerated injection.  Next injection due in 28 days. Marland Kitchen Appointment made for 08/16/2018. Reminder card given. Carilyn Goodpasture, RN,BSN  Testosterone Cypionate Exp:06/2019 Lot;[id number] [phone]  unable to document NDC, system will not accept information.

## 2018-07-26 ENCOUNTER — Ambulatory Visit: Payer: Self-pay | Admitting: Family Medicine

## 2018-07-26 MED FILL — LISINOPRIL 5 MG TAB: 5 | 30 days supply | Qty: 30 | Fill #2

## 2018-07-26 MED FILL — ROSUVASTATIN CALCIUM 10 MG: 10 | 30 days supply | Qty: 30 | Fill #3

## 2018-07-26 MED FILL — ?GLIMEPIRIDE 4 MG TABLET: 4 | 30 days supply | Qty: 30 | Fill #2

## 2018-08-04 MED FILL — TESTOSTERONE CYP 200 MG/ML: 200 | 28 days supply | Qty: 1 | Fill #2

## 2018-08-04 MED FILL — OMEPRAZOLE DR 40 MG CAPSULE: 40 | 30 days supply | Qty: 30 | Fill #3

## 2018-08-06 ENCOUNTER — Ambulatory Visit: Payer: Self-pay | Attending: Family Medicine | Admitting: Family Medicine

## 2018-08-06 ENCOUNTER — Encounter: Payer: Self-pay | Admitting: Family Medicine

## 2018-08-06 VITALS — BP 136/73 | HR 55 | Temp 97.8°F | Ht 65.0 in | Wt 179.2 lb

## 2018-08-06 DIAGNOSIS — E785 Hyperlipidemia, unspecified: Secondary | ICD-10-CM | POA: Insufficient documentation

## 2018-08-06 DIAGNOSIS — M549 Dorsalgia, unspecified: Secondary | ICD-10-CM | POA: Insufficient documentation

## 2018-08-06 DIAGNOSIS — Z7984 Long term (current) use of oral hypoglycemic drugs: Secondary | ICD-10-CM | POA: Insufficient documentation

## 2018-08-06 DIAGNOSIS — E291 Testicular hypofunction: Secondary | ICD-10-CM | POA: Insufficient documentation

## 2018-08-06 DIAGNOSIS — B352 Tinea manuum: Secondary | ICD-10-CM | POA: Insufficient documentation

## 2018-08-06 DIAGNOSIS — Z79899 Other long term (current) drug therapy: Secondary | ICD-10-CM | POA: Insufficient documentation

## 2018-08-06 DIAGNOSIS — E1100 Type 2 diabetes mellitus with hyperosmolarity without nonketotic hyperglycemic-hyperosmolar coma (NKHHC): Secondary | ICD-10-CM

## 2018-08-06 DIAGNOSIS — Z79890 Hormone replacement therapy (postmenopausal): Secondary | ICD-10-CM | POA: Insufficient documentation

## 2018-08-06 DIAGNOSIS — L989 Disorder of the skin and subcutaneous tissue, unspecified: Secondary | ICD-10-CM | POA: Insufficient documentation

## 2018-08-06 DIAGNOSIS — K219 Gastro-esophageal reflux disease without esophagitis: Secondary | ICD-10-CM | POA: Insufficient documentation

## 2018-08-06 DIAGNOSIS — E119 Type 2 diabetes mellitus without complications: Secondary | ICD-10-CM | POA: Insufficient documentation

## 2018-08-06 DIAGNOSIS — M5137 Other intervertebral disc degeneration, lumbosacral region: Secondary | ICD-10-CM | POA: Insufficient documentation

## 2018-08-06 DIAGNOSIS — M5136 Other intervertebral disc degeneration, lumbar region: Secondary | ICD-10-CM

## 2018-08-06 DIAGNOSIS — I10 Essential (primary) hypertension: Secondary | ICD-10-CM | POA: Insufficient documentation

## 2018-08-06 DIAGNOSIS — E349 Endocrine disorder, unspecified: Secondary | ICD-10-CM

## 2018-08-06 LAB — POCT GLYCOSYLATED HEMOGLOBIN (HGB A1C): Hemoglobin A1C: 6.1 % — AB (ref 4.0–5.6)

## 2018-08-06 LAB — GLUCOSE, POCT (MANUAL RESULT ENTRY): POC GLUCOSE: 138 mg/dL — AB (ref 70–99)

## 2018-08-06 MED ORDER — GLIMEPIRIDE 4 MG PO TABS: 4.0000 mg | ORAL_TABLET | Freq: Every day | ORAL | 6 refills | Status: DC

## 2018-08-06 MED ORDER — MELOXICAM 15 MG PO TABS: 15.0000 mg | ORAL_TABLET | Freq: Every day | ORAL | 1 refills | Status: DC

## 2018-08-06 MED ORDER — ROSUVASTATIN CALCIUM 10 MG PO TABS: 10.0000 mg | ORAL_TABLET | Freq: Every day | ORAL | 6 refills | Status: DC

## 2018-08-06 MED ORDER — TIZANIDINE HCL 4 MG PO TABS: 4.0000 mg | ORAL_TABLET | Freq: Three times a day (TID) | ORAL | 2 refills | Status: DC | PRN

## 2018-08-06 MED ORDER — LISINOPRIL 5 MG PO TABS: 5.0000 mg | ORAL_TABLET | Freq: Every day | ORAL | 6 refills | Status: DC

## 2018-08-06 MED ORDER — OMEPRAZOLE 40 MG PO CPDR: 40.0000 mg | DELAYED_RELEASE_CAPSULE | Freq: Every day | ORAL | 6 refills | Status: DC

## 2018-08-06 MED ORDER — TERBINAFINE HCL 250 MG PO TABS: 250.0000 mg | ORAL_TABLET | Freq: Every day | ORAL | 0 refills | Status: AC

## 2018-08-06 MED ORDER — TRAMADOL HCL 50 MG PO TABS: 50.0000 mg | ORAL_TABLET | Freq: Every day | ORAL | 1 refills | Status: DC

## 2018-08-06 MED FILL — traMADol HCL 50 MG TABS: 50 | 30 days supply | Qty: 30 | Fill #0

## 2018-08-06 MED FILL — TERBINAFINE HCL 250 MG TAB: 250 | 30 days supply | Qty: 30 | Fill #0

## 2018-08-06 MED FILL — LISINOPRIL 5 MG TAB: 5 | 30 days supply | Qty: 30 | Fill #0

## 2018-08-06 MED FILL — OMEPRAZOLE DR 40 MG CAPSULE: 40 | 30 days supply | Qty: 30 | Fill #0

## 2018-08-06 MED FILL — tiZANidine HCL 4 MG TABS: 4 | 30 days supply | Qty: 90 | Fill #0

## 2018-08-06 MED FILL — MELOXICAM 15 MG TABLET: 15 | 30 days supply | Qty: 30 | Fill #0
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
## 2018-08-06 NOTE — Progress Notes (Signed)
62

## 2018-08-07 LAB — CMP14+EGFR
ALT: 30 IU/L (ref 0–44)
AST: 24 IU/L (ref 0–40)
Albumin/Globulin Ratio: 2 (ref 1.2–2.2)
Albumin: 4.5 g/dL (ref 3.6–4.8)
Alkaline Phosphatase: 66 IU/L (ref 39–117)
BUN/Creatinine Ratio: 22 (ref 10–24)
BUN: 14 mg/dL (ref 8–27)
Bilirubin Total: 0.4 mg/dL (ref 0.0–1.2)
CALCIUM: 9.6 mg/dL (ref 8.6–10.2)
CO2: 24 mmol/L (ref 20–29)
Chloride: 102 mmol/L (ref 96–106)
Creatinine, Ser: 0.65 mg/dL — ABNORMAL LOW (ref 0.76–1.27)
GFR calc Af Amer: 117 mL/min/{1.73_m2} (ref >59)
GFR, EST NON AFRICAN AMERICAN: 101 mL/min/{1.73_m2} (ref >59)
Globulin, Total: 2.3 g/dL (ref 1.5–4.5)
Glucose: 127 mg/dL — ABNORMAL HIGH (ref 65–99)
POTASSIUM: 4.6 mmol/L (ref 3.5–5.2)
Sodium: 141 mmol/L (ref 134–144)
Total Protein: 6.8 g/dL (ref 6.0–8.5)

## 2018-08-07 LAB — TESTOSTERONE, FREE, TOTAL, SHBG
Sex Hormone Binding: 46 nmol/L (ref 19.3–76.4)
Testosterone, Free: 4.2 pg/mL — ABNORMAL LOW (ref 6.6–18.1)
Testosterone: 141 ng/dL — ABNORMAL LOW (ref 264–916)

## 2018-08-10 ENCOUNTER — Encounter: Payer: Self-pay | Admitting: Family Medicine

## 2018-08-10 ENCOUNTER — Other Ambulatory Visit: Payer: Self-pay

## 2018-08-10 DIAGNOSIS — E119 Type 2 diabetes mellitus without complications: Secondary | ICD-10-CM

## 2018-08-10 MED ORDER — GLUCOSE BLOOD VI STRP: ORAL_STRIP | 12 refills | Status: DC

## 2018-08-10 MED ORDER — TRUEPLUS LANCETS 28G MISC: 12 refills | Status: DC

## 2018-08-10 MED FILL — TRUE METRIX TEST STRIP: 30 days supply | Qty: 100 | Fill #0

## 2018-08-10 MED FILL — TRUEplus LANCETS 28G MISC: 30 days supply | Qty: 100 | Fill #0

## 2018-08-16 ENCOUNTER — Ambulatory Visit: Payer: Self-pay | Attending: Family Medicine | Admitting: *Deleted

## 2018-08-16 VITALS — Temp 98.2°F

## 2018-08-16 DIAGNOSIS — E349 Endocrine disorder, unspecified: Secondary | ICD-10-CM

## 2018-08-16 NOTE — Progress Notes (Signed)
Patient tolerated injection well today 

## 2018-08-17 ENCOUNTER — Ambulatory Visit: Payer: Self-pay | Attending: Family Medicine | Admitting: Physical Therapy

## 2018-08-18 ENCOUNTER — Ambulatory Visit: Payer: Self-pay

## 2018-08-23 MED FILL — ROSUVASTATIN CALCIUM 10 MG: 10 | 30 days supply | Qty: 30 | Fill #0

## 2018-08-23 MED FILL — ?GLIMEPIRIDE 4 MG TABLET: 4 | 30 days supply | Qty: 30 | Fill #3

## 2018-08-30 ENCOUNTER — Ambulatory Visit: Payer: Self-pay | Attending: Family Medicine

## 2018-09-02 MED FILL — LISINOPRIL 5 MG TAB: 5 | 30 days supply | Qty: 30 | Fill #1

## 2018-09-02 MED FILL — TESTOSTERONE CYP 200 MG/ML: 200 | 28 days supply | Qty: 1 | Fill #3

## 2018-09-02 MED FILL — OMEPRAZOLE DR 40 MG CAPSULE: 40 | 30 days supply | Qty: 30 | Fill #1

## 2018-09-06 ENCOUNTER — Ambulatory Visit: Payer: Self-pay

## 2018-09-09 ENCOUNTER — Ambulatory Visit (INDEPENDENT_AMBULATORY_CARE_PROVIDER_SITE_OTHER): Payer: Self-pay | Admitting: Family Medicine

## 2018-09-09 ENCOUNTER — Other Ambulatory Visit: Payer: Self-pay

## 2018-09-09 DIAGNOSIS — L819 Disorder of pigmentation, unspecified: Secondary | ICD-10-CM

## 2018-09-09 NOTE — Assessment & Plan Note (Signed)
Likely benign pigmented nevus but given history of changing will await biopsy

## 2018-09-09 NOTE — Patient Instructions (Signed)
Good to see you today!  Thanks for coming in.  We will contact you in about 1-2 weeks with results of the biopsy  If you have any infection - fever or redness or pus contact us immediately  If the biopsy is negative you do not need to do anything  If you would like the mole above your lip removed you can make another appointment.  It will leave a scar     .Qu bueno verte hoy! Gracias por venir  Nos comunicaremos con usted en aproximadamente 1-2 semanas con los resultados de la biopsia.  Si tiene alguna infeccin: fiebre, enrojecimiento o pus, contctenos de inmediato.  Si la biopsia es negativa, no necesita Research officer, trade union.  Si desea que se elimine el lunar que est sobre su labio, puede hacer otra cita. Dejar una cicatriz    Biopsia de piel, cuidados posteriores Skin Biopsy, Care After Esta hoja le brinda informacin sobre cmo cuidarse despus del procedimiento. El mdico tambin podr darle indicaciones ms especficas. Comunquese con el mdico si tiene problemas o preguntas. Qu puedo esperar despus del procedimiento? Despus del procedimiento, es comn [employer] siguientes sntomas:  [place].  [place]ton[place].  Picazn. Siga estas indicaciones en su casa: El cuidado del lugar de la biopsia Siga las indicaciones del mdico acerca de los cuidados en el lugar de la biopsia. Asegrese de hacer lo siguiente:  Lvese las manos con agua y [place] antes y despus de [place]ar[place] manager la venda (vendaje). Use desinfectante para manos si no dispone de [place] y [place].  Aplquese ungento en el lugar de la biopsia segn las indicaciones del mdico.  Cambie los vendajes como se lo haya indicado el mdico.  No retire los puntos (suturas), la goma para cerrar la piel o las tiras [place]. Es posible que estos cierres cutneos Animal nutritionist en la piel durante 2semanas o ms tiempo. Si los bordes de las tiras adhesivas empiezan a despegarse y Therapist, sports, puede recortar los que estn sueltos. No  retire las tiras [facility] por completo a menos que el mdico se lo indique.  Si la zona de la biopsia sangra, aplique una leve presin durante 34minutos. Verifique TEFL teacher de la biopsia todos los das para detectar signos de infeccin. Est atento a los siguientes signos:  Enrojecimiento, [place].  Lquido o sangre.  Calor.  Pus o mal olor.  Indicaciones generales  Descanse y luego reanude sus actividades normales como se lo haya indicado el mdico.  Tome los medicamentos de venta libre y los recetados solamente como se lo haya indicado el mdico.  Consulting civil engineer a todas las visitas de seguimiento como se lo haya indicado el mdico. Esto es importante. Comunquese con un mdico si:  Tiene enrojecimiento, hinchazn o dolor alrededor del lugar de la biopsia.  Observa lquido o sangre que salen del lugar de la biopsia.  El lugar de la biopsia se siente caliente al tacto.  Tiene pus o percibe mal olor que proviene del lugar de la biopsia.  Tiene fiebre.  Las suturas, la goma para cerrar la piel o las tiras [place] se aflojan o se salen antes de lo previsto. Solicite ayuda inmediatamente si:  Tiene sangrado que no se detiene con la presin o un vendaje. Resumen  Despus del procedimiento, es frecuente Patent attorney, moretones y Futures trader.  Siga las indicaciones del mdico acerca de los cuidados en el lugar de la biopsia.  Verifique TEFL teacher de la biopsia todos los das para detectar signos de infeccin.  Comunquese con un mdico si tiene enrojecimiento, hinchazn o dolor alrededor del lugar de la biopsia o si este est caliente al tacto.  Concurra a todas las visitas de seguimiento como se lo haya indicado el mdico. Esto es importante. Esta informacin no tiene Marine scientist el consejo del mdico. Asegrese de hacerle al mdico cualquier pregunta que tenga. Document Released: 09/14/2015 Document Revised: 04/02/2018 Document Reviewed:  04/02/2018 Elsevier Interactive Patient Education  2019 [employer].

## 2018-09-09 NOTE — Progress Notes (Signed)
Subjective  Michael Campos is a 68 y.o. male is presenting with the following  SKIN LESION On left temple area of dark mole slowly increasing in size.  No bleeding or pain  No history of skin cancer or family history  Chief Complaint noted Review of Symptoms - see HPI PMH - Smoking status noted.    Objective Vital Signs reviewed BP 114/72   Pulse 68   Temp 98.6 F (37 C) (Oral)   Ht 5\' 5"  (1.651 m)   Wt 185 lb 3.2 oz (84 kg)   SpO2 96%   BMI 30.82 kg/m       Procedure  Area cleansed with alcohol 0.5 cc lidocaine with epi injected under lesion 2 mm punch bx taken with minimal bleeding controlled with pressure and silver nitrate Patient feels well after   Assessments/Plans  See after visit summary for details of patient instuctions  Pigmented skin lesion of uncertain nature Likely benign pigmented nevus but given history of changing will await biopsy

## 2018-09-13 ENCOUNTER — Ambulatory Visit: Payer: Self-pay | Attending: Family Medicine | Admitting: *Deleted

## 2018-09-13 ENCOUNTER — Encounter: Payer: Self-pay | Admitting: Family Medicine

## 2018-09-13 DIAGNOSIS — E349 Endocrine disorder, unspecified: Secondary | ICD-10-CM

## 2018-09-13 MED ORDER — TESTOSTERONE CYPIONATE 100 MG/ML IM SOLN
100.0000 mg | INTRAMUSCULAR | Status: DC
  Administered 2018-09-13 – 2018-10-11 (×2): 100 mg via INTRAMUSCULAR

## 2018-09-13 NOTE — Progress Notes (Signed)
Patient here today for  injection. He was given a testosterone injection in  right upper outer quadrant.  Site unremarkable & patient tolerated injection.  Next injection due between February . Appointment made for 28 days. Reminder card given. Carilyn Goodpasture, RN,BSN

## 2018-09-22 MED FILL — ?GLIMEPIRIDE 4 MG TABLET: 4 | 30 days supply | Qty: 30 | Fill #0

## 2018-09-22 MED FILL — ROSUVASTATIN CALCIUM 10 MG: 10 | 30 days supply | Qty: 30 | Fill #1

## 2018-09-30 ENCOUNTER — Other Ambulatory Visit: Payer: Self-pay

## 2018-09-30 DIAGNOSIS — M792 Neuralgia and neuritis, unspecified: Secondary | ICD-10-CM

## 2018-09-30 DIAGNOSIS — M25552 Pain in left hip: Secondary | ICD-10-CM

## 2018-09-30 MED ORDER — PREGABALIN 100 MG PO CAPS: 100.0000 mg | ORAL_CAPSULE | Freq: Two times a day (BID) | ORAL | 1 refills | Status: DC

## 2018-10-07 MED FILL — TESTOSTERONE CYP 200 MG/ML: 200 | 28 days supply | Qty: 1 | Fill #4

## 2018-10-07 MED FILL — LISINOPRIL 5 MG TAB: 5 | 30 days supply | Qty: 30 | Fill #2

## 2018-10-07 MED FILL — OMEPRAZOLE DR 40 MG CAPSULE: 40 | 30 days supply | Qty: 30 | Fill #2

## 2018-10-11 ENCOUNTER — Ambulatory Visit: Payer: Self-pay | Attending: Family Medicine | Admitting: *Deleted

## 2018-10-11 DIAGNOSIS — E349 Endocrine disorder, unspecified: Secondary | ICD-10-CM

## 2018-10-11 MED ORDER — TESTOSTERONE CYPIONATE 100 MG/ML IM SOLN: 100.0000 mg | Freq: Once | INTRAMUSCULAR | Status: DC

## 2018-10-11 NOTE — Progress Notes (Signed)
Patient here today for testosterone injection. Injection was administered in his right upper outer quadrant.  Site unremarkable & patient tolerated injection.  Next injection due in 28 days. Marland Kitchen Appointment made for 11/08/2018 . Reminder card given. Carilyn Goodpasture, RN,BSN

## 2018-10-23 ENCOUNTER — Encounter (HOSPITAL_COMMUNITY): Payer: Self-pay | Admitting: Emergency Medicine

## 2018-10-23 ENCOUNTER — Ambulatory Visit (HOSPITAL_COMMUNITY)
Admission: EM | Admit: 2018-10-23 | Discharge: 2018-10-23 | Disposition: A | Discharge status: Home / Self Care | Payer: Self-pay | Attending: Family Medicine | Admitting: Family Medicine

## 2018-10-23 DIAGNOSIS — M545 Low back pain, unspecified: Secondary | ICD-10-CM

## 2018-10-23 DIAGNOSIS — R8290 Unspecified abnormal findings in urine: Secondary | ICD-10-CM

## 2018-10-23 LAB — POCT URINALYSIS DIP (DEVICE)
BILIRUBIN URINE: NEGATIVE
Bilirubin Urine: NEGATIVE
Glucose, UA: NEGATIVE mg/dL
Glucose, UA: NEGATIVE mg/dL
Hgb urine dipstick: NEGATIVE
Hgb urine dipstick: NEGATIVE
KETONES UR: 15 mg/dL — AB
KETONES UR: 15 mg/dL — AB
Leukocytes,Ua: NEGATIVE
Leukocytes,Ua: NEGATIVE
Nitrite: NEGATIVE
Nitrite: NEGATIVE
PROTEIN: 30 mg/dL — AB
Protein, ur: 30 mg/dL — AB
Specific Gravity, Urine: 1.02 (ref 1.005–1.030)
Specific Gravity, Urine: 1.025 (ref 1.005–1.030)
Urobilinogen, UA: 1 mg/dL (ref 0.0–1.0)
Urobilinogen, UA: 1 mg/dL (ref 0.0–1.0)
pH: 5.5 (ref 5.0–8.0)
pH: 5.5 (ref 5.0–8.0)

## 2018-10-23 MED ORDER — IBUPROFEN 800 MG PO TABS: 800.0000 mg | ORAL_TABLET | Freq: Three times a day (TID) | ORAL | 0 refills | Status: DC

## 2018-10-23 MED ORDER — KETOROLAC TROMETHAMINE 30 MG/ML IJ SOLN
30.0000 mg | Freq: Once | INTRAMUSCULAR | Status: AC
  Administered 2018-10-23: 30 mg via INTRAMUSCULAR

## 2018-10-23 MED ORDER — CYCLOBENZAPRINE HCL 5 MG PO TABS: 5.0000 mg | ORAL_TABLET | Freq: Two times a day (BID) | ORAL | 0 refills | Status: DC | PRN

## 2018-10-23 MED ORDER — KETOROLAC TROMETHAMINE 30 MG/ML IJ SOLN
INTRAMUSCULAR | Status: AC
  Filled 2018-10-23: qty 1

## 2018-10-23 NOTE — Discharge Instructions (Signed)
Use anti-inflammatories for pain/swelling. You may take up to 800 mg Ibuprofen every 8 hours with food. You may supplement Ibuprofen with Tylenol [phone] mg every 8 hours.   You may use flexeril as needed to help with pain. This is a muscle relaxer and causes sedation- please use only at bedtime or when you will be home and not have to drive/work- Begin with 1 tablet, may take second if not causing significant drowsiness  Alternate ice and heat  Follow up if not resolving Avoid heavy lifting, avoid complete bed rest  Follow up if not resolving or worsening, numbness or tingling, weakness in legs, issues with urination or bowel movements
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
## 2018-10-23 NOTE — ED Triage Notes (Signed)
Pt here for right sided back pain

## 2018-10-26 MED FILL — TRUEplus LANCETS 28G MISC: 30 days supply | Qty: 100 | Fill #1

## 2018-10-26 MED FILL — ?GLIMEPIRIDE 4 MG TABLET: 4 | 30 days supply | Qty: 30 | Fill #1

## 2018-10-26 MED FILL — ROSUVASTATIN CALCIUM 10 MG: 10 | 30 days supply | Qty: 30 | Fill #2

## 2018-10-26 MED FILL — TRUE METRIX TEST STRIP: 30 days supply | Qty: 100 | Fill #1

## 2018-11-04 LAB — HM DIABETES EYE EXAM

## 2018-11-08 ENCOUNTER — Ambulatory Visit: Payer: Self-pay

## 2018-11-22 MED FILL — OMEPRAZOLE DR 40 MG CAPSULE: 40 | 30 days supply | Qty: 30 | Fill #3

## 2018-11-22 MED FILL — ROSUVASTATIN CALCIUM 10 MG: 10 | 30 days supply | Qty: 30 | Fill #3

## 2018-11-22 MED FILL — GLIMEPIRIDE 4 MG TABS: 4 | 90 days supply | Qty: 90 | Fill #2

## 2018-11-22 MED FILL — LISINOPRIL 5 MG TAB: 5 | 30 days supply | Qty: 30 | Fill #3

## 2018-11-23 ENCOUNTER — Other Ambulatory Visit: Payer: Self-pay | Admitting: Family Medicine

## 2018-11-25 NOTE — Telephone Encounter (Signed)
Pt last seen: 08/06/18 Next appt: 12/07/18 Last RX written on: 04/22/18 Date of original fill: 04/22/18 Date of refill(s): 05/27/18, 08/04/18, 09/02/18, 10/07/18   The following controlled substances were also filled during this time: Lyrica 100mg  #180/90ds written by Dr. Margarita Rana on 09/30/18, filled on 10/18/18.   Please refill if appropriate.

## 2018-11-30 MED FILL — TESTOSTERONE CYP 200 MG/ML: 200 | 28 days supply | Qty: 1 | Fill #0

## 2018-12-07 ENCOUNTER — Ambulatory Visit: Payer: Self-pay | Attending: Family Medicine | Admitting: *Deleted

## 2018-12-07 ENCOUNTER — Other Ambulatory Visit: Payer: Self-pay

## 2018-12-07 DIAGNOSIS — E349 Endocrine disorder, unspecified: Secondary | ICD-10-CM

## 2018-12-07 MED ORDER — TESTOSTERONE CYPIONATE 100 MG/ML IM SOLN
100.0000 mg | INTRAMUSCULAR | Status: AC
  Administered 2018-12-07 – 2019-12-06 (×6): 100 mg via INTRAMUSCULAR

## 2018-12-07 NOTE — Progress Notes (Signed)
[id number] 12/2019 [id number]  Patient here today for  injection.  A testosterone injection given today in the right upper outer quadrant.  Site unremarkable & patient tolerated injection.  Next injection due in 28 days: Jan 04, 2019.  Reminder card given. Carilyn Goodpasture, RN,BSN

## 2018-12-20 MED FILL — ROSUVASTATIN CALCIUM 10 MG: 10 | 30 days supply | Qty: 30 | Fill #4

## 2018-12-20 MED FILL — OMEPRAZOLE DR 40 MG CAPSULE: 40 | 30 days supply | Qty: 30 | Fill #4

## 2018-12-20 MED FILL — LISINOPRIL 5 MG TAB: 5 | 30 days supply | Qty: 30 | Fill #4

## 2019-01-14 ENCOUNTER — Other Ambulatory Visit: Payer: Self-pay

## 2019-01-14 ENCOUNTER — Ambulatory Visit: Payer: Self-pay | Attending: Family Medicine | Admitting: Emergency Medicine

## 2019-01-14 DIAGNOSIS — E349 Endocrine disorder, unspecified: Secondary | ICD-10-CM

## 2019-01-14 MED ORDER — TESTOSTERONE CYPIONATE 200 MG/ML IM SOLN
200.0000 mg | INTRAMUSCULAR | Status: AC
  Administered 2019-01-14: 10:00:00 200 mg via INTRAMUSCULAR

## 2019-01-14 NOTE — Progress Notes (Signed)
Patient here today for  injection.  A testosterone injection given today in the right upper outer quadrant.  Site unremarkable & patient tolerated injection.  Next injection due in 28 days: Jan 04, 2019.  Reminder card given.

## 2019-01-15 ENCOUNTER — Encounter (HOSPITAL_COMMUNITY): Payer: Self-pay | Admitting: Emergency Medicine

## 2019-01-15 ENCOUNTER — Ambulatory Visit (HOSPITAL_COMMUNITY)
Admission: EM | Admit: 2019-01-15 | Discharge: 2019-01-15 | Disposition: A | Discharge status: Home / Self Care | Payer: Self-pay | Attending: Family Medicine | Admitting: Family Medicine

## 2019-01-15 ENCOUNTER — Other Ambulatory Visit: Payer: Self-pay

## 2019-01-15 DIAGNOSIS — S0990XA Unspecified injury of head, initial encounter: Secondary | ICD-10-CM

## 2019-01-15 MED ORDER — IBUPROFEN 800 MG PO TABS: 800.0000 mg | ORAL_TABLET | Freq: Three times a day (TID) | ORAL | 0 refills | Status: AC

## 2019-01-15 MED ORDER — CYCLOBENZAPRINE HCL 5 MG PO TABS: 5.0000 mg | ORAL_TABLET | Freq: Two times a day (BID) | ORAL | 0 refills | Status: DC | PRN

## 2019-01-15 MED FILL — CYCLOBENZAPRINE 5 MG TABLET: 5 | 8 days supply | Qty: 24 | Fill #0

## 2019-01-15 MED FILL — ?IBUPROFEN 800 MG TABS: AMNEAL | 10 days supply | Qty: 30 | Fill #0
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
## 2019-01-15 NOTE — Discharge Instructions (Signed)
Use anti-inflammatories for pain/swelling. You may take up to 800 mg Ibuprofen every 8 hours with food. You may supplement Ibuprofen with Tylenol [phone] mg every 8 hours.   You may use flexeril as needed to help with pain. This is a muscle relaxer and causes sedation- please use only at bedtime or when you will be home and not have to drive/work  Si su sintomas no mejoran, tiene mas dolor en cabeza, cambios en vision, confundido, nausea, mareos ir a Science writer

## 2019-01-15 NOTE — ED Notes (Signed)
Patient able to ambulate independently  

## 2019-01-18 ENCOUNTER — Ambulatory Visit: Payer: Self-pay | Attending: Family Medicine | Admitting: Family Medicine

## 2019-01-18 ENCOUNTER — Other Ambulatory Visit: Payer: Self-pay

## 2019-01-25 MED FILL — LISINOPRIL 5 MG TAB: 5 | 30 days supply | Qty: 30 | Fill #5

## 2019-01-25 MED FILL — ?ROSUVASTATIN CALCIUM 10 MG: 10 | 30 days supply | Qty: 30 | Fill #5

## 2019-01-25 MED FILL — ?OMEPRAZOLE 20 MG CAPSULE D: 20 | 30 days supply | Qty: 60 | Fill #0

## 2019-02-01 ENCOUNTER — Ambulatory Visit: Payer: Self-pay

## 2019-02-03 IMAGING — DX DG ANKLE COMPLETE 3+V*R*
3 series · 3 of 3 positions shown · non-contrast
Comparison: 02/07/2006 right foot

CLINICAL DATA: Per pt: pain and numbness in the right foot and
right ankle for three days. Patient stated that the sole of the foot
is numb and tingling like when his foot gets cold in the winter.
Pain is radiating to the mid lower leg.

EXAM:
RIGHT ANKLE - COMPLETE 3+ VIEW

[ankle ap]
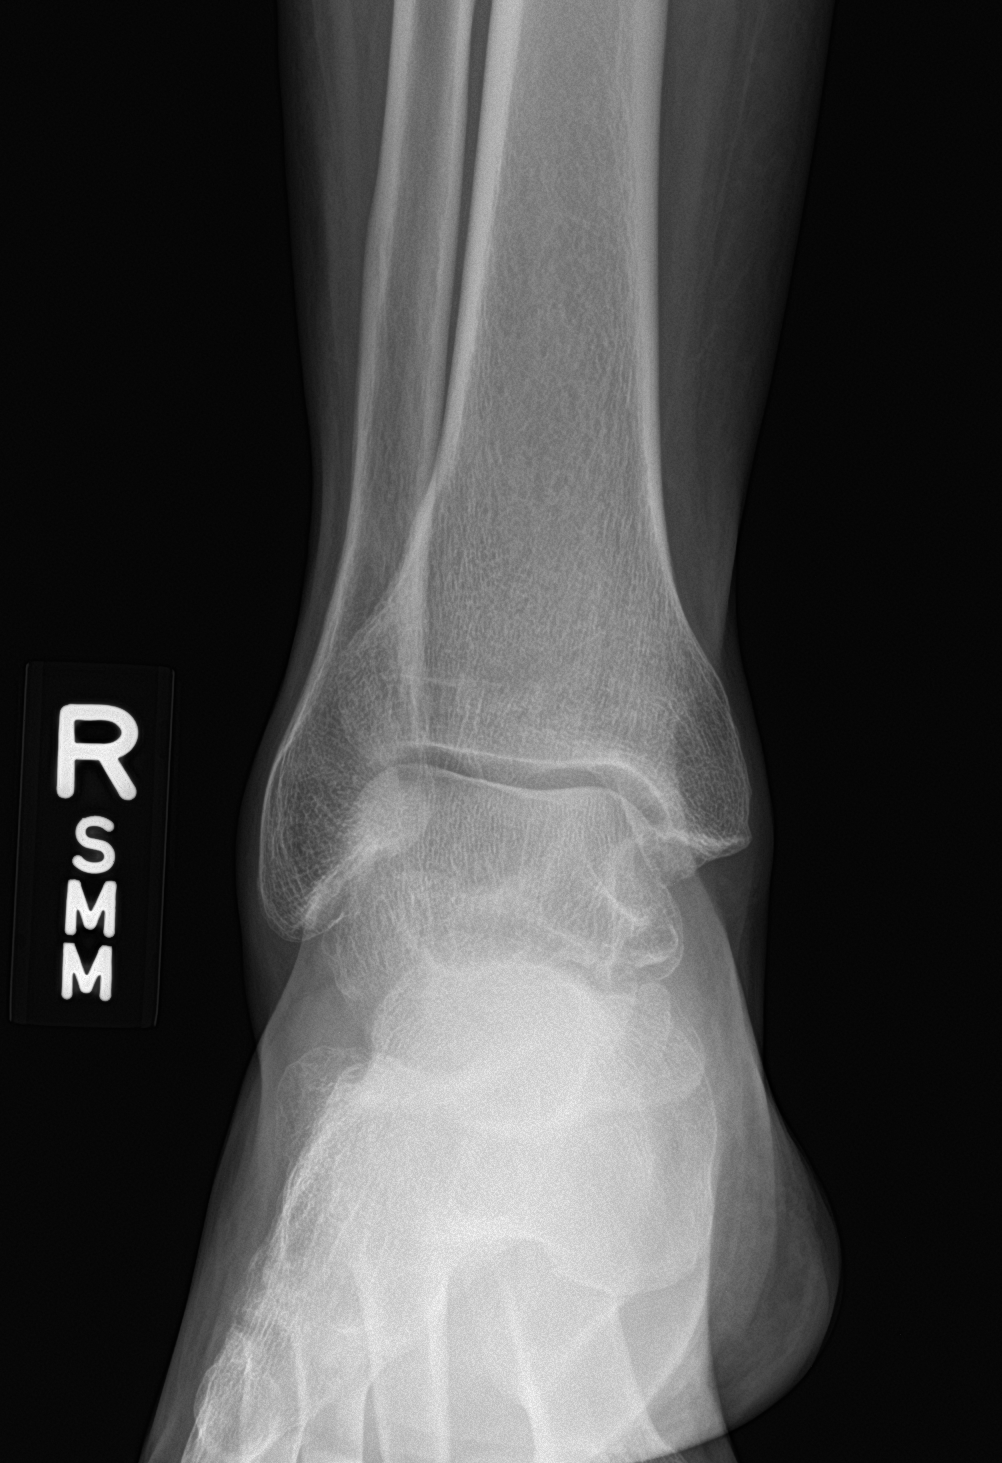

[ankle obl]
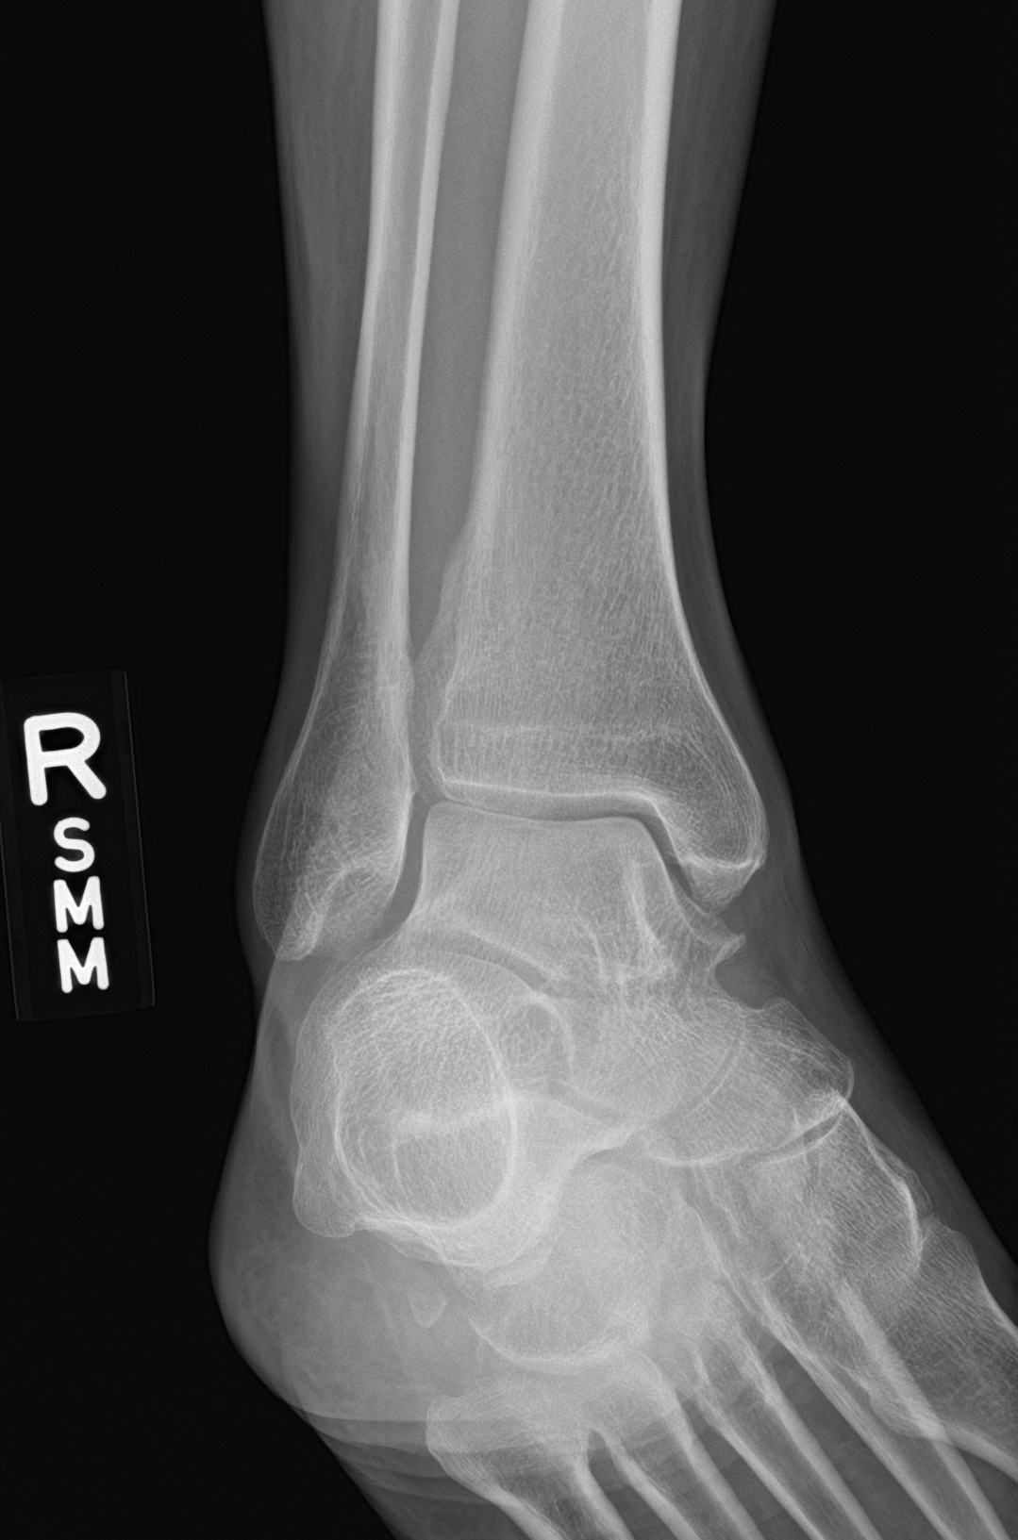

[ankle lat]
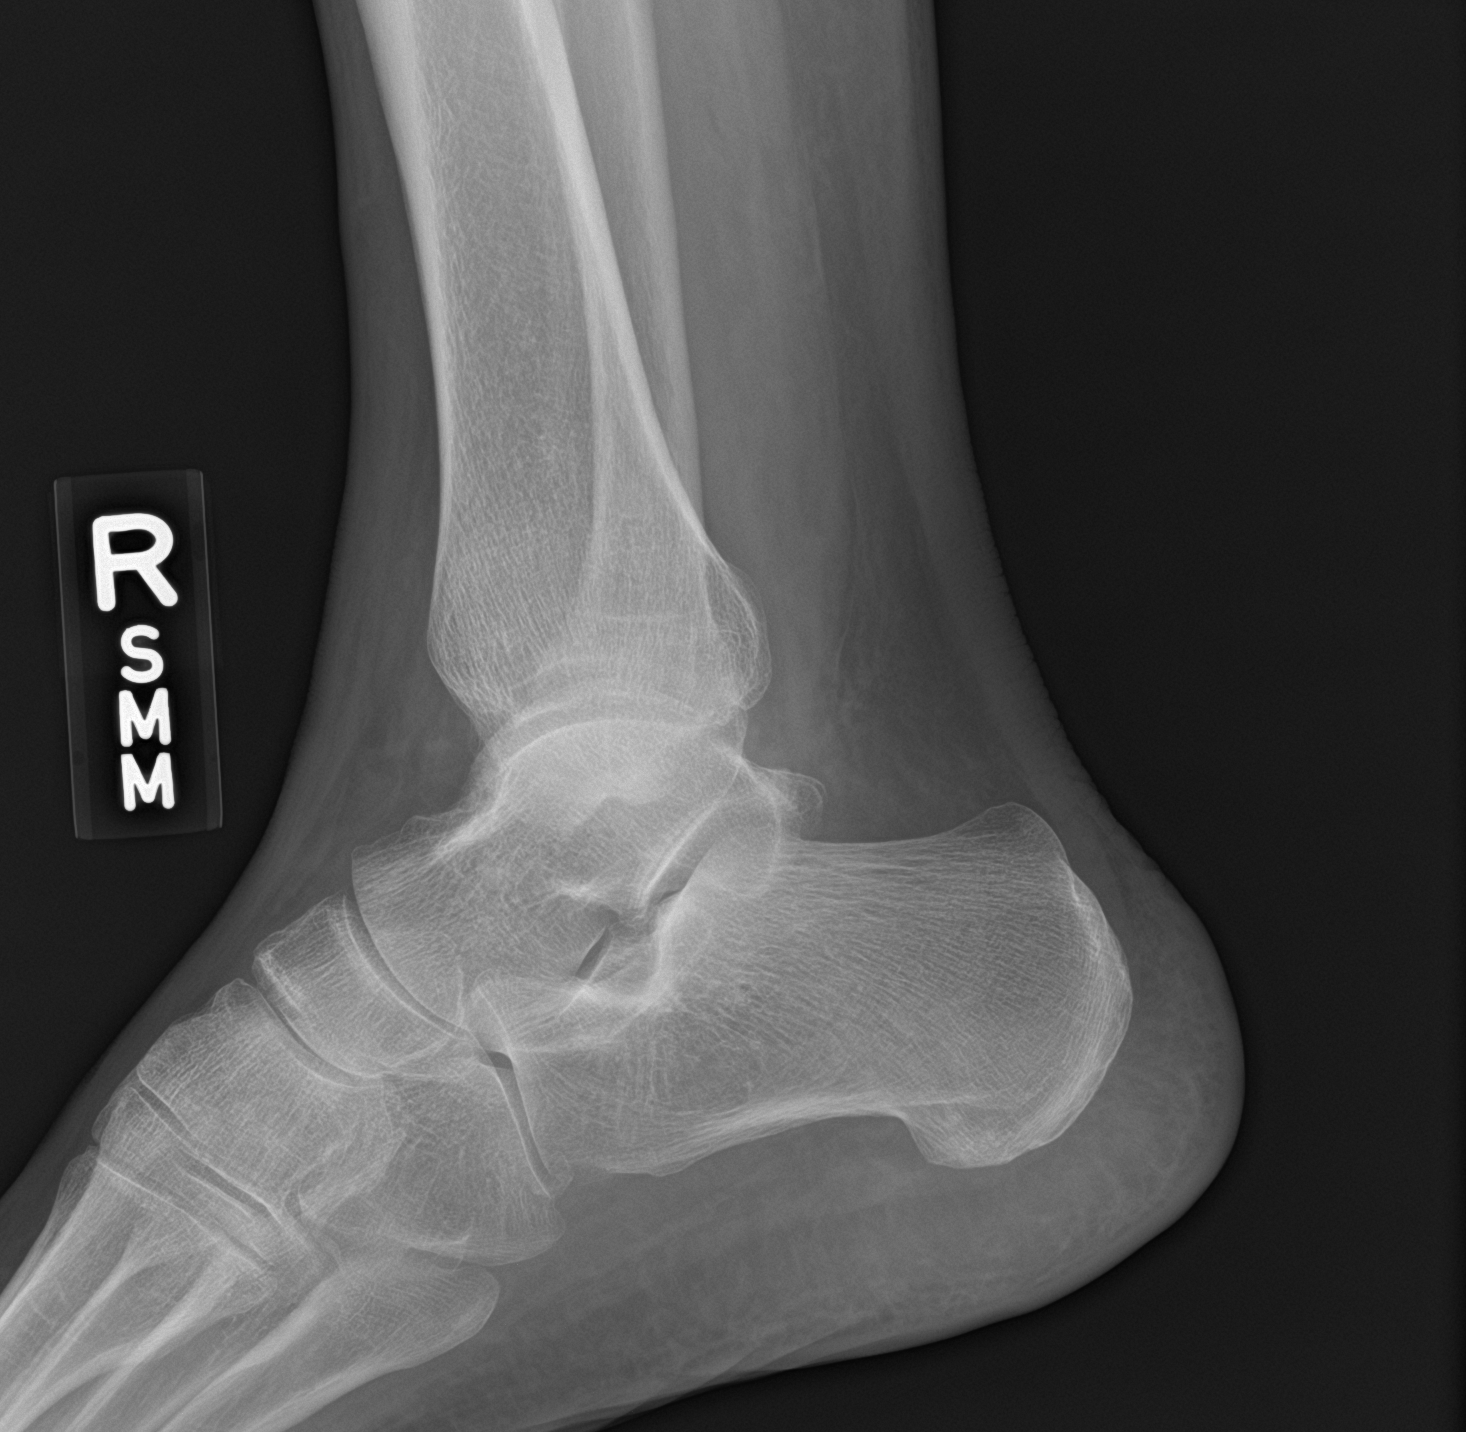

[3 of 3 positions shown; findings below may reference images not displayed]

FINDINGS: There is no evidence of fracture, dislocation, or joint effusion.
There is no evidence of arthropathy or other focal bone abnormality.
Soft tissues are unremarkable.
IMPRESSION: Negative.

## 2019-02-15 ENCOUNTER — Ambulatory Visit: Payer: Self-pay

## 2019-02-21 ENCOUNTER — Other Ambulatory Visit: Payer: Self-pay | Admitting: Family Medicine

## 2019-02-21 MED FILL — LISINOPRIL 5 MG TAB: 5 | 30 days supply | Qty: 30 | Fill #6

## 2019-02-21 MED FILL — GLIMEPIRIDE 4 MG TABS: 4 | 30 days supply | Qty: 30 | Fill #3

## 2019-02-21 MED FILL — ?ROSUVASTATIN CALCIUM 10 MG: 10 | 30 days supply | Qty: 30 | Fill #6

## 2019-02-21 MED FILL — TRUEplus LANCETS 28G MISC: 30 days supply | Qty: 100 | Fill #2

## 2019-02-21 MED FILL — TRUE METRIX TEST STRIP: 30 days supply | Qty: 100 | Fill #2

## 2019-02-21 MED FILL — ?OMEPRAZOLE 20 MG CAPSULE D: 20 | 30 days supply | Qty: 60 | Fill #0

## 2019-03-22 ENCOUNTER — Other Ambulatory Visit: Payer: Self-pay | Admitting: Family Medicine

## 2019-03-22 DIAGNOSIS — M25552 Pain in left hip: Secondary | ICD-10-CM

## 2019-03-22 DIAGNOSIS — M792 Neuralgia and neuritis, unspecified: Secondary | ICD-10-CM

## 2019-03-22 MED FILL — LISINOPRIL 5 MG TAB: 5 | 30 days supply | Qty: 30 | Fill #3

## 2019-03-22 MED FILL — GLIMEPIRIDE 4 MG TABS: 4 | 30 days supply | Qty: 30 | Fill #4

## 2019-03-22 MED FILL — ?ROSUVASTATIN CALCIUM 10 MG: 10 | 30 days supply | Qty: 30 | Fill #0

## 2019-03-23 ENCOUNTER — Ambulatory Visit: Payer: Self-pay | Attending: Family Medicine

## 2019-03-23 ENCOUNTER — Other Ambulatory Visit: Payer: Self-pay

## 2019-03-23 DIAGNOSIS — M792 Neuralgia and neuritis, unspecified: Secondary | ICD-10-CM

## 2019-03-23 DIAGNOSIS — M25552 Pain in left hip: Secondary | ICD-10-CM

## 2019-03-23 MED ORDER — PREGABALIN 100 MG PO CAPS: ORAL_CAPSULE | ORAL | 1 refills | Status: DC

## 2019-03-23 NOTE — Progress Notes (Unsigned)
Printed script for PASS

## 2019-04-06 ENCOUNTER — Ambulatory Visit (HOSPITAL_COMMUNITY)
Admission: EM | Admit: 2019-04-06 | Discharge: 2019-04-06 | Disposition: A | Discharge status: Home / Self Care | Payer: Self-pay | Attending: Urgent Care | Admitting: Urgent Care

## 2019-04-06 ENCOUNTER — Encounter (HOSPITAL_COMMUNITY): Payer: Self-pay | Admitting: Urgent Care

## 2019-04-06 ENCOUNTER — Other Ambulatory Visit: Payer: Self-pay

## 2019-04-06 DIAGNOSIS — S81012A Laceration without foreign body, left knee, initial encounter: Secondary | ICD-10-CM

## 2019-04-06 DIAGNOSIS — G8929 Other chronic pain: Secondary | ICD-10-CM

## 2019-04-06 DIAGNOSIS — Z23 Encounter for immunization: Secondary | ICD-10-CM

## 2019-04-06 DIAGNOSIS — W270XXA Contact with workbench tool, initial encounter: Secondary | ICD-10-CM

## 2019-04-06 DIAGNOSIS — M545 Low back pain: Secondary | ICD-10-CM

## 2019-04-06 DIAGNOSIS — M25562 Pain in left knee: Secondary | ICD-10-CM

## 2019-04-06 DIAGNOSIS — M5136 Other intervertebral disc degeneration, lumbar region: Secondary | ICD-10-CM

## 2019-04-06 MED ORDER — TETANUS-DIPHTH-ACELL PERTUSSIS 5-2.5-18.5 LF-MCG/0.5 IM SUSP
0.5000 mL | Freq: Once | INTRAMUSCULAR | Status: AC
  Administered 2019-04-06: 14:00:00 0.5 mL via INTRAMUSCULAR

## 2019-04-06 MED ORDER — LIDOCAINE-EPINEPHRINE (PF) 2 %-1:200000 IJ SOLN
INTRAMUSCULAR | Status: AC
  Filled 2019-04-06: qty 20

## 2019-04-06 MED ORDER — LIDOCAINE HCL (PF) 2 % IJ SOLN
INTRAMUSCULAR | Status: AC
  Filled 2019-04-06: qty 2

## 2019-04-06 MED ORDER — TETANUS-DIPHTH-ACELL PERTUSSIS 5-2.5-18.5 LF-MCG/0.5 IM SUSP
INTRAMUSCULAR | Status: AC
  Filled 2019-04-06: qty 0.5

## 2019-04-06 MED ORDER — TRAMADOL HCL 50 MG PO TABS: 50.0000 mg | ORAL_TABLET | Freq: Four times a day (QID) | ORAL | 0 refills | Status: AC | PRN
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
## 2019-04-10 ENCOUNTER — Telehealth (HOSPITAL_COMMUNITY): Payer: Self-pay | Admitting: Urgent Care

## 2019-04-10 MED ORDER — CEPHALEXIN 500 MG PO CAPS: 500.0000 mg | ORAL_CAPSULE | Freq: Three times a day (TID) | ORAL | 0 refills | Status: DC

## 2019-04-10 NOTE — Telephone Encounter (Signed)
Patient called to notify me that his wound is slightly red, more painful and draining. 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 Will have him start Keflex, he is agreeable to resting a couple of days now. Counseled on signs and sx warranting immediate recheck. Patient verbalized understanding.

## 2019-04-19 ENCOUNTER — Encounter: Payer: Self-pay | Admitting: Family Medicine

## 2019-04-19 ENCOUNTER — Other Ambulatory Visit: Payer: Self-pay

## 2019-04-19 ENCOUNTER — Other Ambulatory Visit: Payer: Self-pay | Admitting: Family Medicine

## 2019-04-19 ENCOUNTER — Ambulatory Visit: Payer: Self-pay | Attending: Family Medicine | Admitting: Family Medicine

## 2019-04-19 VITALS — BP 136/72 | HR 57 | Temp 98.3°F | Ht 65.0 in | Wt 179.4 lb

## 2019-04-19 DIAGNOSIS — E119 Type 2 diabetes mellitus without complications: Secondary | ICD-10-CM

## 2019-04-19 DIAGNOSIS — Z4802 Encounter for removal of sutures: Secondary | ICD-10-CM

## 2019-04-19 DIAGNOSIS — E785 Hyperlipidemia, unspecified: Secondary | ICD-10-CM

## 2019-04-19 DIAGNOSIS — E349 Endocrine disorder, unspecified: Secondary | ICD-10-CM

## 2019-04-19 DIAGNOSIS — R300 Dysuria: Secondary | ICD-10-CM

## 2019-04-19 DIAGNOSIS — I10 Essential (primary) hypertension: Secondary | ICD-10-CM

## 2019-04-19 DIAGNOSIS — N481 Balanitis: Secondary | ICD-10-CM

## 2019-04-19 DIAGNOSIS — S81012A Laceration without foreign body, left knee, initial encounter: Secondary | ICD-10-CM

## 2019-04-19 DIAGNOSIS — E1100 Type 2 diabetes mellitus with hyperosmolarity without nonketotic hyperglycemic-hyperosmolar coma (NKHHC): Secondary | ICD-10-CM

## 2019-04-19 LAB — POCT GLYCOSYLATED HEMOGLOBIN (HGB A1C): HbA1c, POC (controlled diabetic range): 6.7 % (ref 0.0–7.0)

## 2019-04-19 LAB — POCT URINALYSIS DIP (CLINITEK)
Bilirubin, UA: NEGATIVE
Blood, UA: NEGATIVE
Glucose, UA: NEGATIVE mg/dL
Ketones, POC UA: NEGATIVE mg/dL
Nitrite, UA: NEGATIVE
POC PROTEIN,UA: NEGATIVE
Spec Grav, UA: 1.015 (ref 1.010–1.025)
Urobilinogen, UA: 0.2 E.U./dL
pH, UA: 7 (ref 5.0–8.0)

## 2019-04-19 LAB — GLUCOSE, POCT (MANUAL RESULT ENTRY): POC Glucose: 120 mg/dl — AB (ref 70–99)

## 2019-04-19 MED ORDER — TESTOSTERONE CYPIONATE 200 MG/ML IM SOLN
200.0000 mg | Freq: Once | INTRAMUSCULAR | Status: AC
  Administered 2019-04-19: 200 mg via INTRAMUSCULAR

## 2019-04-19 MED ORDER — CLOTRIMAZOLE 1 % EX CREA: 1.0000 "application " | TOPICAL_CREAM | Freq: Two times a day (BID) | CUTANEOUS | 0 refills | Status: AC

## 2019-04-19 MED ORDER — LISINOPRIL 5 MG PO TABS: 5.0000 mg | ORAL_TABLET | Freq: Every day | ORAL | 6 refills | Status: DC

## 2019-04-19 MED ORDER — TESTOSTERONE CYPIONATE 200 MG/ML IM SOLN: INTRAMUSCULAR | 5 refills | Status: DC

## 2019-04-19 MED ORDER — GLIMEPIRIDE 4 MG PO TABS: 4.0000 mg | ORAL_TABLET | Freq: Every day | ORAL | 6 refills | Status: DC

## 2019-04-19 MED ORDER — ROSUVASTATIN CALCIUM 10 MG PO TABS: 10.0000 mg | ORAL_TABLET | Freq: Every day | ORAL | 6 refills | Status: DC

## 2019-04-19 MED FILL — ?ROSUVASTATIN CALCIUM 10 MG: 10 | 30 days supply | Qty: 30 | Fill #0

## 2019-04-19 MED FILL — LISINOPRIL 5 MG TAB: 5 | 30 days supply | Qty: 30 | Fill #0

## 2019-04-19 MED FILL — GLIMEPIRIDE 4 MG TABS: 4 | 30 days supply | Qty: 30 | Fill #0
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
## 2019-04-19 NOTE — Progress Notes (Signed)
Patient is having burning while urinating

## 2019-04-20 LAB — BASIC METABOLIC PANEL
BUN/Creatinine Ratio: 25 — ABNORMAL HIGH (ref 10–24)
BUN: 16 mg/dL (ref 8–27)
CO2: 23 mmol/L (ref 20–29)
Calcium: 9.6 mg/dL (ref 8.6–10.2)
Chloride: 100 mmol/L (ref 96–106)
Creatinine, Ser: 0.64 mg/dL — ABNORMAL LOW (ref 0.76–1.27)
GFR calc Af Amer: 117 mL/min/{1.73_m2} (ref >59)
GFR calc non Af Amer: 101 mL/min/{1.73_m2} (ref >59)
Glucose: 95 mg/dL (ref 65–99)
Potassium: 4.5 mmol/L (ref 3.5–5.2)
Sodium: 140 mmol/L (ref 134–144)

## 2019-04-22 ENCOUNTER — Telehealth: Payer: Self-pay | Admitting: Family Medicine

## 2019-04-22 NOTE — Telephone Encounter (Signed)
New Message  Pt's wife states he does have Acid reflux and when he was asked at his appt he didn't understand the question. Please f/u

## 2019-04-22 NOTE — Telephone Encounter (Signed)
Will route to PC for review. 

## 2019-04-25 MED ORDER — OMEPRAZOLE 40 MG PO CPDR: 40.0000 mg | DELAYED_RELEASE_CAPSULE | Freq: Every day | ORAL | 3 refills | Status: DC

## 2019-04-25 MED FILL — OMEPRAZOLE DR 40 MG CAPSULE: 40 | 30 days supply | Qty: 30 | Fill #0

## 2019-04-25 NOTE — Telephone Encounter (Signed)
I have sent Omeprazole to his pharmacy

## 2019-04-26 MED FILL — TRUE METRIX TEST STRIP: 33 days supply | Qty: 100 | Fill #3

## 2019-04-26 MED FILL — TRUEplus LANCETS 28G MISC: 30 days supply | Qty: 100 | Fill #3

## 2019-04-26 NOTE — Telephone Encounter (Signed)
Patient was called and a voicemail was left informing patient to return phone call. 

## 2019-05-20 ENCOUNTER — Other Ambulatory Visit: Payer: Self-pay

## 2019-05-20 ENCOUNTER — Ambulatory Visit: Payer: Self-pay | Attending: Family Medicine

## 2019-05-20 DIAGNOSIS — E349 Endocrine disorder, unspecified: Secondary | ICD-10-CM

## 2019-05-20 MED ORDER — TESTOSTERONE CYPIONATE 200 MG/ML IM SOLN
100.0000 mg | Freq: Once | INTRAMUSCULAR | Status: AC
  Administered 2019-05-20: 10:00:00 100 mg via INTRAMUSCULAR

## 2019-05-20 NOTE — Progress Notes (Signed)
Patient came into the clinic for Testosterone Injection.  Injection on the right ventro gluteal. Pt. Tolerated well.  Injected 0.32ml   Testosterone Cypionate Injection 200mg //ml NDC [id number] Ecp: 03/2020

## 2019-05-23 ENCOUNTER — Other Ambulatory Visit: Payer: Self-pay | Admitting: Family Medicine

## 2019-05-23 DIAGNOSIS — E785 Hyperlipidemia, unspecified: Secondary | ICD-10-CM

## 2019-05-23 MED FILL — OMEPRAZOLE DR 40 MG CAPSULE: 40 | 30 days supply | Qty: 30 | Fill #1

## 2019-05-23 MED FILL — GLIMEPIRIDE 4 MG TABS: 4 | 30 days supply | Qty: 30 | Fill #1

## 2019-05-23 MED FILL — LISINOPRIL 5 MG TABLET: 5 | 30 days supply | Qty: 30 | Fill #1

## 2019-05-23 MED FILL — ROSUVASTATIN CALCIUM 10 MG: 10 | 30 days supply | Qty: 30 | Fill #1

## 2019-06-17 ENCOUNTER — Other Ambulatory Visit: Payer: Self-pay

## 2019-06-17 ENCOUNTER — Ambulatory Visit: Payer: Self-pay | Attending: Family Medicine | Admitting: *Deleted

## 2019-06-17 DIAGNOSIS — Z23 Encounter for immunization: Secondary | ICD-10-CM

## 2019-06-17 DIAGNOSIS — E349 Endocrine disorder, unspecified: Secondary | ICD-10-CM

## 2019-06-17 DIAGNOSIS — E348 Other specified endocrine disorders: Secondary | ICD-10-CM

## 2019-06-17 MED ORDER — TESTOSTERONE CYPIONATE 100 MG/ML IM SOLN
100.0000 mg | Freq: Once | INTRAMUSCULAR | Status: AC
  Administered 2019-06-17: 10:00:00 100 mg via INTRAMUSCULAR

## 2019-06-17 NOTE — Progress Notes (Signed)
Patient here today for a testosterone injection.  Testosterone injection administered today in  Left upper outer quadrant.  Site unremarkable & patient tolerated injection well.  Next injection due  July 15, 2019. Appointment made for that date. Reminder card given. Carilyn Goodpasture, RN,BSN

## 2019-06-20 MED FILL — OMEPRAZOLE DR 40 MG CAPSULE: 40 | 30 days supply | Qty: 30 | Fill #2

## 2019-06-20 MED FILL — ROSUVASTATIN CALCIUM 10 MG: 10 | 30 days supply | Qty: 30 | Fill #2

## 2019-06-20 MED FILL — LISINOPRIL 5 MG TABLET: 5 | 30 days supply | Qty: 30 | Fill #2

## 2019-06-20 MED FILL — GLIMEPIRIDE 4 MG TABS: 4 | 30 days supply | Qty: 30 | Fill #2

## 2019-06-24 ENCOUNTER — Other Ambulatory Visit: Payer: Self-pay | Admitting: Family Medicine

## 2019-06-24 DIAGNOSIS — E349 Endocrine disorder, unspecified: Secondary | ICD-10-CM

## 2019-06-24 MED FILL — TRUEplus LANCETS 28G MISC: 30 days supply | Qty: 100 | Fill #4

## 2019-06-24 MED FILL — TRUE METRIX TEST STRIP: 33 days supply | Qty: 100 | Fill #4

## 2019-06-24 MED FILL — TESTOSTERONE CYP 200 MG/ML: 200 | 28 days supply | Qty: 1 | Fill #0

## 2019-06-24 NOTE — Telephone Encounter (Signed)
Please refill if patient should continue injections.

## 2019-06-27 ENCOUNTER — Other Ambulatory Visit: Payer: Self-pay | Admitting: Family Medicine

## 2019-06-27 DIAGNOSIS — M792 Neuralgia and neuritis, unspecified: Secondary | ICD-10-CM

## 2019-06-27 DIAGNOSIS — M25552 Pain in left hip: Secondary | ICD-10-CM

## 2019-06-30 ENCOUNTER — Other Ambulatory Visit: Payer: Self-pay

## 2019-06-30 DIAGNOSIS — M25552 Pain in left hip: Secondary | ICD-10-CM

## 2019-06-30 DIAGNOSIS — M792 Neuralgia and neuritis, unspecified: Secondary | ICD-10-CM

## 2019-06-30 MED ORDER — PREGABALIN 100 MG PO CAPS: ORAL_CAPSULE | ORAL | 0 refills | Status: DC

## 2019-07-08 ENCOUNTER — Other Ambulatory Visit: Payer: Self-pay

## 2019-07-08 ENCOUNTER — Ambulatory Visit (HOSPITAL_COMMUNITY)
Admission: EM | Admit: 2019-07-08 | Discharge: 2019-07-08 | Disposition: A | Discharge status: Home / Self Care | Payer: Self-pay | Attending: Family Medicine | Admitting: Family Medicine

## 2019-07-08 ENCOUNTER — Encounter (HOSPITAL_COMMUNITY): Payer: Self-pay

## 2019-07-08 DIAGNOSIS — G8929 Other chronic pain: Secondary | ICD-10-CM

## 2019-07-08 DIAGNOSIS — K649 Unspecified hemorrhoids: Secondary | ICD-10-CM

## 2019-07-08 DIAGNOSIS — K219 Gastro-esophageal reflux disease without esophagitis: Secondary | ICD-10-CM

## 2019-07-08 DIAGNOSIS — M546 Pain in thoracic spine: Secondary | ICD-10-CM

## 2019-07-08 MED ORDER — ACETAMINOPHEN 500 MG PO TABS: 1000.0000 mg | ORAL_TABLET | Freq: Three times a day (TID) | ORAL | 0 refills | Status: AC | PRN

## 2019-07-08 MED ORDER — DOCUSATE SODIUM 100 MG PO CAPS: 100.0000 mg | ORAL_CAPSULE | Freq: Every day | ORAL | 0 refills | Status: AC | PRN

## 2019-07-08 MED ORDER — OMEPRAZOLE 40 MG PO CPDR: 40.0000 mg | DELAYED_RELEASE_CAPSULE | Freq: Every day | ORAL | 0 refills | Status: DC

## 2019-07-08 MED ORDER — ALUM & MAG HYDROXIDE-SIMETH 200-200-20 MG/5ML PO SUSP
ORAL | Status: AC
  Filled 2019-07-08: qty 30

## 2019-07-08 MED ORDER — ALUM & MAG HYDROXIDE-SIMETH 200-200-20 MG/5ML PO SUSP
30.0000 mL | Freq: Once | ORAL | Status: AC
  Administered 2019-07-08: 30 mL via ORAL

## 2019-07-08 MED ORDER — LIDOCAINE VISCOUS HCL 2 % MT SOLN
OROMUCOSAL | Status: AC
  Filled 2019-07-08: qty 15

## 2019-07-08 MED ORDER — LIDOCAINE VISCOUS HCL 2 % MT SOLN
15.0000 mL | Freq: Once | OROMUCOSAL | Status: AC
  Administered 2019-07-08: 15 mL via ORAL

## 2019-07-08 MED FILL — TESTOSTERONE CYP 200 MG/ML: 200 | 28 days supply | Qty: 1 | Fill #0
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
## 2019-07-08 NOTE — Discharge Instructions (Signed)
May take tylenol in addition to your tramadol for your back pain.  Please take omeprazole daily to help with your upper abdominal pain and heart burn.  Please see provided information about diet recommendations to help with this.  Limit time spent on the toilet, use a stool softener to prevent constipation, drink plenty of water, use over the counter hemorrhoid cream as needed for hemorrhoids.  Please follow up with your primary care provider as needed for persistent symptoms.    Puede tomar tylenol adems de su tramadol para su dolor de espalda. Tome omeprazol todos los das para aliviar el dolor abdominal superior y el ardor de [place]. Consulte la informacin proporcionada sobre las recomendaciones dietticas para ayudar con esto. Limite el tiempo que pasa en el bao, use un ablandador de heces para prevenir el estreimiento, beba mucha agua, use crema para hemorroides de venta libre segn sea necesario para las hemorroides. Haga un seguimiento con su proveedor de atencin primaria segn sea necesario para los sntomas persistentes.

## 2019-07-08 NOTE — ED Triage Notes (Signed)
Pt states he is having epigastric x 3 days after he ate some hot enchiladas. Pt states he was vomiting and had diarrhea 3 days ago. Pt states the epigastric pain is worse after he eats.   Pt states the back pain is related to the cold weather. Pt is taking Tramadol without relief.

## 2019-07-15 ENCOUNTER — Ambulatory Visit: Payer: Self-pay | Attending: Family Medicine | Admitting: *Deleted

## 2019-07-15 ENCOUNTER — Other Ambulatory Visit: Payer: Self-pay

## 2019-07-15 DIAGNOSIS — E349 Endocrine disorder, unspecified: Secondary | ICD-10-CM

## 2019-07-15 NOTE — Patient Instructions (Signed)
Thank you for coming today.

## 2019-07-15 NOTE — Progress Notes (Signed)
Patient tolerated injection well in the right upper quadrant. Patient scheduled for next dose.

## 2019-07-25 ENCOUNTER — Other Ambulatory Visit: Payer: Self-pay

## 2019-07-25 ENCOUNTER — Ambulatory Visit: Payer: Self-pay | Attending: Family Medicine | Admitting: Family Medicine

## 2019-07-25 DIAGNOSIS — E1149 Type 2 diabetes mellitus with other diabetic neurological complication: Secondary | ICD-10-CM

## 2019-07-25 DIAGNOSIS — G44319 Acute post-traumatic headache, not intractable: Secondary | ICD-10-CM

## 2019-07-25 DIAGNOSIS — I10 Essential (primary) hypertension: Secondary | ICD-10-CM

## 2019-07-25 DIAGNOSIS — K648 Other hemorrhoids: Secondary | ICD-10-CM

## 2019-07-25 MED ORDER — HYDROCORT-PRAMOXINE (PERIANAL) 2.5-1 % EX CREA: 1.0000 "application " | TOPICAL_CREAM | Freq: Three times a day (TID) | CUTANEOUS | 2 refills | Status: AC

## 2019-07-25 MED FILL — ROSUVASTATIN CALCIUM 10 MG: 10 | 30 days supply | Qty: 30 | Fill #3

## 2019-07-25 MED FILL — GLIMEPIRIDE 4 MG TABS: 4 | 30 days supply | Qty: 30 | Fill #3

## 2019-07-25 MED FILL — LISINOPRIL 5 MG TABLET: 5 | 30 days supply | Qty: 30 | Fill #3

## 2019-07-25 MED FILL — OMEPRAZOLE DR 40 MG CAPSULE: 40 | 30 days supply | Qty: 30 | Fill #3

## 2019-07-25 MED FILL — HYDROCORT-PRAMOXINE 2.5-1%: 2.5-1 | 7 days supply | Qty: 30 | Fill #0
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
## 2019-07-25 NOTE — Progress Notes (Signed)
Patient has been called and DOB has been verified. Patient has been screened and transferred to PCP to start phone visit.   Patient has been having headaches and dizziness due to wood falling on his head while working.

## 2019-07-26 ENCOUNTER — Encounter: Payer: Self-pay | Admitting: Family Medicine

## 2019-08-02 ENCOUNTER — Other Ambulatory Visit: Payer: Self-pay

## 2019-08-02 ENCOUNTER — Ambulatory Visit: Payer: Self-pay | Attending: Family Medicine | Admitting: Pharmacist

## 2019-08-02 DIAGNOSIS — Z23 Encounter for immunization: Secondary | ICD-10-CM

## 2019-08-02 NOTE — Progress Notes (Signed)
Patient presents for vaccination against zoster per orders of Dr. Margarita Rana. Consent given. Counseling provided. No contraindications exists. Vaccine administered without incident.  Will coordinate for patient assistance; pt should return in 2 months for second zoster vaccination.

## 2019-08-10 MED FILL — TESTOSTERONE CYP 200 MG/ML: 200 | 28 days supply | Qty: 1 | Fill #1

## 2019-08-12 ENCOUNTER — Other Ambulatory Visit: Payer: Self-pay

## 2019-08-12 ENCOUNTER — Ambulatory Visit: Payer: Self-pay | Attending: Family Medicine

## 2019-08-12 DIAGNOSIS — E349 Endocrine disorder, unspecified: Secondary | ICD-10-CM

## 2019-08-12 DIAGNOSIS — E348 Other specified endocrine disorders: Secondary | ICD-10-CM

## 2019-08-12 MED ORDER — TESTOSTERONE CYPIONATE 100 MG/ML IM SOLN: 100.0000 mg | Freq: Once | INTRAMUSCULAR | Status: DC

## 2019-08-12 MED ORDER — TESTOSTERONE CYPIONATE 200 MG/ML IM SOLN
200.0000 mg | Freq: Once | INTRAMUSCULAR | Status: AC
  Administered 2019-08-12: 10:00:00 200 mg via INTRAMUSCULAR

## 2019-08-12 NOTE — Progress Notes (Signed)
Pt came into the office today for his testosterone injection.   After I gave pt injection he stated he was suppose to get half of the medicine back  After pt stated that I went to see how much pt was suppose to receive pt was suppose to receive 100mg  of the injection  I went and spoke with Dr. Margarita Rana regarding the situation and Dr. Margarita Rana went and spoke with pt.  Pt states he doesn't have any questions concerns. Pt states he feels fine. We informed pt that if he develop and concerns to please give [place] a call

## 2019-08-22 ENCOUNTER — Other Ambulatory Visit: Payer: Self-pay | Admitting: Family Medicine

## 2019-08-22 MED FILL — HYDROCORT-PRAMOXINE 2.5-1%: 2.5-1 | 7 days supply | Qty: 30 | Fill #1

## 2019-08-22 MED FILL — LISINOPRIL 5 MG TABLET: 5 | 30 days supply | Qty: 30 | Fill #4

## 2019-08-22 MED FILL — ?ROSUVASTATIN CALCIUM 10 MG: 10 | 30 days supply | Qty: 30 | Fill #4

## 2019-08-22 MED FILL — GLIMEPIRIDE 4 MG TABS: 4 | 30 days supply | Qty: 30 | Fill #4

## 2019-08-23 MED FILL — OMEPRAZOLE DR 40 MG CAPSULE: 40 | 30 days supply | Qty: 30 | Fill #0

## 2019-08-29 MED FILL — ?METHYLPREDNISOLONE 4 MG TA: 4 | 6 days supply | Qty: 1 | Fill #0

## 2019-09-07 ENCOUNTER — Other Ambulatory Visit: Payer: Self-pay | Admitting: Family Medicine

## 2019-09-07 DIAGNOSIS — E349 Endocrine disorder, unspecified: Secondary | ICD-10-CM

## 2019-09-07 MED FILL — TESTOSTERONE CYP 200 MG/ML: 200 | 28 days supply | Qty: 1 | Fill #2

## 2019-09-12 ENCOUNTER — Other Ambulatory Visit: Payer: Self-pay | Admitting: Family Medicine

## 2019-09-12 ENCOUNTER — Ambulatory Visit: Payer: Self-pay | Attending: Family Medicine | Admitting: *Deleted

## 2019-09-12 ENCOUNTER — Other Ambulatory Visit: Payer: Self-pay

## 2019-09-12 DIAGNOSIS — E349 Endocrine disorder, unspecified: Secondary | ICD-10-CM

## 2019-09-12 DIAGNOSIS — E119 Type 2 diabetes mellitus without complications: Secondary | ICD-10-CM

## 2019-09-12 DIAGNOSIS — E348 Other specified endocrine disorders: Secondary | ICD-10-CM

## 2019-09-12 MED ORDER — TESTOSTERONE CYPIONATE 100 MG/ML IM SOLN
100.0000 mg | Freq: Once | INTRAMUSCULAR | Status: AC
  Administered 2019-09-12: 10:00:00 100 mg via INTRAMUSCULAR

## 2019-09-12 NOTE — Progress Notes (Signed)
Patient here today for Teststosterone  injection.  Administered in right upper outer quadrant.  Site unremarkable & patient tolerated injection. Patient due for lab test per Dr. Margarita Rana. Orders were placed on 09/07/2019.  Next injection due between October 11, 2019. He is also set to receive 2nd Shingrix vaccine.  Appointment made for October 11, 2019 at 0900. Reminder card given. Carilyn Goodpasture, RN,BSN

## 2019-09-14 MED FILL — TRUEplus LANCETS 28G MISC: 33 days supply | Qty: 100 | Fill #0

## 2019-09-16 ENCOUNTER — Other Ambulatory Visit (INDEPENDENT_AMBULATORY_CARE_PROVIDER_SITE_OTHER): Payer: Self-pay

## 2019-09-16 ENCOUNTER — Telehealth (INDEPENDENT_AMBULATORY_CARE_PROVIDER_SITE_OTHER): Payer: Self-pay

## 2019-09-16 ENCOUNTER — Telehealth: Payer: Self-pay | Admitting: Family Medicine

## 2019-09-16 DIAGNOSIS — E119 Type 2 diabetes mellitus without complications: Secondary | ICD-10-CM

## 2019-09-16 MED ORDER — GLUCOSE BLOOD VI STRP: ORAL_STRIP | 12 refills | Status: AC

## 2019-09-16 MED FILL — TRUE METRIX GLUCOSE TEST ST: 33 days supply | Qty: 100 | Fill #0

## 2019-09-16 NOTE — Telephone Encounter (Signed)
1) Medication(s) Requested (by name): Test Strips and needles   2) Pharmacy of Choice: [facility] pharmacy   3) Special Requests: [facility] pharmacy     Approved medications will be sent to the pharmacy, we will reach out if there is an issue.  Requests made after 3pm may not be addressed until the following business day!  If a patient is unsure of the name of the medication(s) please note and ask patient to call back when they are able to provide all info, do not send to responsible party until all information is available!

## 2019-09-16 NOTE — Telephone Encounter (Signed)
Done

## 2019-09-16 NOTE — Telephone Encounter (Signed)
-----   Message from Charlott Rakes, MD sent at 09/15/2019 10:52 AM EST ----- Prostate test is normal and so is his blood count. A1c is 6.8 which has trended up from 6.1 previously. No regimen changes but encouraged to comply with Diabetic diet and lifestyle modifications

## 2019-09-16 NOTE — Telephone Encounter (Signed)
Patient was called and a voicemail was left informing patient to return phone call for lab results. 

## 2019-09-18 LAB — HEMOGLOBIN A1C
Est. average glucose Bld gHb Est-mCnc: 148 mg/dL
Hgb A1c MFr Bld: 6.8 % — ABNORMAL HIGH (ref 4.8–5.6)

## 2019-09-18 LAB — CBC WITH DIFFERENTIAL/PLATELET
Basophils Absolute: 0 10*3/uL (ref 0.0–0.2)
Basos: 0 %
EOS (ABSOLUTE): 0.1 10*3/uL (ref 0.0–0.4)
Eos: 1 %
Hematocrit: 47.1 % (ref 37.5–51.0)
Hemoglobin: 15.9 g/dL (ref 13.0–17.7)
Immature Grans (Abs): 0 10*3/uL (ref 0.0–0.1)
Immature Granulocytes: 0 %
Lymphocytes Absolute: 4.1 10*3/uL — ABNORMAL HIGH (ref 0.7–3.1)
Lymphs: 40 %
MCH: 31.6 pg (ref 26.6–33.0)
MCHC: 33.8 g/dL (ref 31.5–35.7)
MCV: 94 fL (ref 79–97)
Monocytes Absolute: 0.8 10*3/uL (ref 0.1–0.9)
Monocytes: 8 %
Neutrophils Absolute: 5.2 10*3/uL (ref 1.4–7.0)
Neutrophils: 51 %
Platelets: 299 10*3/uL (ref 150–450)
RBC: 5.03 x10E6/uL (ref 4.14–5.80)
RDW: 12.6 % (ref 11.6–15.4)
WBC: 10.3 10*3/uL (ref 3.4–10.8)

## 2019-09-18 LAB — TESTOSTERONE, FREE AND TOTAL (INCLUDES SHBG)-(MALES)
% Free Testosterone: 0.6 %
Free Testosterone, S: 5 pg/mL — ABNORMAL LOW
Sex Hormone Binding Globulin: 36.3 nmol/L
Testosterone, Serum (Total): 84 ng/dL — ABNORMAL LOW

## 2019-09-18 LAB — PSA, TOTAL AND FREE
PSA, Free Pct: 28 %
PSA, Free: 0.14 ng/mL
Prostate Specific Ag, Serum: 0.5 ng/mL (ref 0.0–4.0)

## 2019-09-22 MED FILL — HYDROCORTISONE ACE-PRAMOXIN: 2.5-1 | 7 days supply | Qty: 30 | Fill #2

## 2019-09-22 MED FILL — ?ROSUVASTATIN CALCIUM 10 MG: 10 | 30 days supply | Qty: 30 | Fill #5

## 2019-09-22 MED FILL — GLIMEPIRIDE 4 MG TABS: 4 | 30 days supply | Qty: 30 | Fill #5

## 2019-09-22 MED FILL — LISINOPRIL 5 MG TABLET: 5 | 30 days supply | Qty: 30 | Fill #5

## 2019-09-22 MED FILL — OMEPRAZOLE DR 40 MG CAPSULE: 40 | 30 days supply | Qty: 30 | Fill #1

## 2019-09-25 ENCOUNTER — Other Ambulatory Visit: Payer: Self-pay | Admitting: Family Medicine

## 2019-09-25 DIAGNOSIS — M792 Neuralgia and neuritis, unspecified: Secondary | ICD-10-CM

## 2019-09-25 DIAGNOSIS — M25552 Pain in left hip: Secondary | ICD-10-CM

## 2019-09-27 ENCOUNTER — Other Ambulatory Visit: Payer: Self-pay

## 2019-09-27 ENCOUNTER — Ambulatory Visit: Payer: Self-pay | Attending: Family Medicine

## 2019-09-29 ENCOUNTER — Telehealth: Payer: Self-pay

## 2019-09-29 NOTE — Telephone Encounter (Signed)
-----   Message from Charlott Rakes, MD sent at 09/15/2019 10:52 AM EST ----- Prostate test is normal and so is his blood count. A1c is 6.8 which has trended up from 6.1 previously. No regimen changes but encouraged to comply with Diabetic diet and lifestyle modifications

## 2019-09-29 NOTE — Telephone Encounter (Signed)
Patient name and DOB has been verified Patient was informed of lab results. Patient had no questions.  

## 2019-10-03 ENCOUNTER — Ambulatory Visit: Payer: Self-pay | Attending: Family Medicine | Admitting: Pharmacist

## 2019-10-03 ENCOUNTER — Other Ambulatory Visit: Payer: Self-pay

## 2019-10-03 DIAGNOSIS — Z23 Encounter for immunization: Secondary | ICD-10-CM

## 2019-10-03 NOTE — Progress Notes (Signed)
Patient presents for vaccination against zoster (2nd dose) per orders of Dr. Margarita Rana. Consent given. Counseling provided. No contraindications exists. Vaccine administered without incident.

## 2019-10-06 MED FILL — TESTOSTERONE CYP 200 MG/ML: 200 | 28 days supply | Qty: 1 | Fill #3

## 2019-10-11 ENCOUNTER — Other Ambulatory Visit: Payer: Self-pay

## 2019-10-11 ENCOUNTER — Ambulatory Visit: Payer: Self-pay | Attending: Family Medicine

## 2019-10-11 DIAGNOSIS — E349 Endocrine disorder, unspecified: Secondary | ICD-10-CM

## 2019-10-11 DIAGNOSIS — E348 Other specified endocrine disorders: Secondary | ICD-10-CM

## 2019-10-11 NOTE — Progress Notes (Signed)
Pt is here for testosterone administration. T 98.101F . No acute distress noted.   Testosterone Cyponate 100mg /ml administered as per Md Newlin standing order every 28 days.  Injection was administered via IM in Gluteus medius muscle. Well tolerated. No reaction noted at the injection site  Scheduled next injection on 11/08/2019. Verbalized understanding [phone] [id number] 03/2022

## 2019-10-24 MED FILL — LISINOPRIL 5 MG TABLET: 5 | 30 days supply | Qty: 30 | Fill #6

## 2019-10-24 MED FILL — ROSUVASTATIN CALCIUM 10 MG: 10 | 30 days supply | Qty: 30 | Fill #6

## 2019-10-24 MED FILL — OMEPRAZOLE DR 40 MG CAPSULE: 40 | 30 days supply | Qty: 30 | Fill #2

## 2019-10-25 ENCOUNTER — Emergency Department (HOSPITAL_COMMUNITY)
Admission: EM | Admit: 2019-10-25 | Discharge: 2019-10-25 | Disposition: A | Discharge status: Home / Self Care | Payer: Self-pay

## 2019-10-25 ENCOUNTER — Ambulatory Visit (HOSPITAL_COMMUNITY)
Admission: RE | Admit: 2019-10-25 | Discharge: 2019-10-25 | Disposition: A | Discharge status: Home / Self Care | Payer: Self-pay | Source: Ambulatory Visit | Attending: Family Medicine | Admitting: Family Medicine

## 2019-10-25 ENCOUNTER — Ambulatory Visit: Payer: Self-pay | Attending: Family Medicine | Admitting: Family Medicine

## 2019-10-25 ENCOUNTER — Other Ambulatory Visit: Payer: Self-pay

## 2019-10-25 ENCOUNTER — Encounter: Payer: Self-pay | Admitting: Family Medicine

## 2019-10-25 VITALS — BP 145/81 | HR 68 | Ht 65.0 in | Wt 180.8 lb

## 2019-10-25 DIAGNOSIS — G8929 Other chronic pain: Secondary | ICD-10-CM | POA: Insufficient documentation

## 2019-10-25 DIAGNOSIS — I10 Essential (primary) hypertension: Secondary | ICD-10-CM

## 2019-10-25 DIAGNOSIS — M546 Pain in thoracic spine: Secondary | ICD-10-CM

## 2019-10-25 DIAGNOSIS — E785 Hyperlipidemia, unspecified: Secondary | ICD-10-CM

## 2019-10-25 DIAGNOSIS — M792 Neuralgia and neuritis, unspecified: Secondary | ICD-10-CM

## 2019-10-25 DIAGNOSIS — E119 Type 2 diabetes mellitus without complications: Secondary | ICD-10-CM

## 2019-10-25 LAB — GLUCOSE, POCT (MANUAL RESULT ENTRY): POC Glucose: 205 mg/dl — AB (ref 70–99)

## 2019-10-25 MED ORDER — GLIMEPIRIDE 4 MG PO TABS: 4.0000 mg | ORAL_TABLET | Freq: Every day | ORAL | 6 refills | Status: DC

## 2019-10-25 MED ORDER — TIZANIDINE HCL 4 MG PO TABS: 4.0000 mg | ORAL_TABLET | Freq: Three times a day (TID) | ORAL | 2 refills | Status: AC | PRN

## 2019-10-25 MED ORDER — OMEPRAZOLE 40 MG PO CPDR: 40.0000 mg | DELAYED_RELEASE_CAPSULE | Freq: Every day | ORAL | 6 refills | Status: DC

## 2019-10-25 MED ORDER — ROSUVASTATIN CALCIUM 10 MG PO TABS: 10.0000 mg | ORAL_TABLET | Freq: Every day | ORAL | 6 refills | Status: DC

## 2019-10-25 MED ORDER — METFORMIN HCL 500 MG PO TABS: 500.0000 mg | ORAL_TABLET | Freq: Every day | ORAL | 6 refills | Status: DC

## 2019-10-25 MED ORDER — LISINOPRIL 5 MG PO TABS: 5.0000 mg | ORAL_TABLET | Freq: Every day | ORAL | 6 refills | Status: DC

## 2019-10-25 MED ORDER — MELOXICAM 15 MG PO TABS: 15.0000 mg | ORAL_TABLET | Freq: Every day | ORAL | 1 refills | Status: AC
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
## 2019-10-25 NOTE — Progress Notes (Signed)
Patient is having pain in upper back area. Patient has taken morning medication and has eaten breakfast.

## 2019-10-26 MED FILL — metFORMIN HCL 500 MG TABS: 500 | 30 days supply | Qty: 30 | Fill #0

## 2019-10-26 MED FILL — GLIMEPIRIDE 4 MG TABS: 4 | 30 days supply | Qty: 30 | Fill #0

## 2019-10-31 ENCOUNTER — Ambulatory Visit: Payer: Self-pay | Admitting: Family Medicine

## 2019-10-31 MED FILL — TESTOSTERONE CYP 200 MG/ML: 200 | 28 days supply | Qty: 1 | Fill #4

## 2019-11-08 ENCOUNTER — Ambulatory Visit: Payer: Self-pay | Attending: Family Medicine | Admitting: *Deleted

## 2019-11-08 ENCOUNTER — Telehealth: Payer: Self-pay | Admitting: *Deleted

## 2019-11-08 ENCOUNTER — Other Ambulatory Visit: Payer: Self-pay

## 2019-11-08 DIAGNOSIS — E349 Endocrine disorder, unspecified: Secondary | ICD-10-CM

## 2019-11-08 DIAGNOSIS — G8929 Other chronic pain: Secondary | ICD-10-CM

## 2019-11-08 NOTE — Telephone Encounter (Signed)
[name] had an appointment this morning for testosterone injection. He asked if writer could mention to his PCP that he has continued back pain. He is taking gabapentin, Mobic and unsure if about tizanidine. Usually his wife gives him all his medications in one container.   Patient was advised of result note from XR. He verbalized understanding.   Please advise.

## 2019-11-08 NOTE — Progress Notes (Signed)
[id number] 03/2022

## 2019-11-08 NOTE — Telephone Encounter (Signed)
Referred to PT

## 2019-11-10 NOTE — Telephone Encounter (Signed)
Made pt aware

## 2019-11-21 MED FILL — metFORMIN HCL 500 MG TABS: 500 | 30 days supply | Qty: 30 | Fill #1

## 2019-11-21 MED FILL — GLIMEPIRIDE 4 MG TABS: 4 | 30 days supply | Qty: 30 | Fill #1

## 2019-11-21 MED FILL — LISINOPRIL 5 MG TABLET: 5 | 30 days supply | Qty: 30 | Fill #0

## 2019-11-21 MED FILL — ROSUVASTATIN CALCIUM 10 MG: 10 | 30 days supply | Qty: 30 | Fill #0

## 2019-11-21 MED FILL — OMEPRAZOLE DR 40 MG CAPSULE: 40 | 30 days supply | Qty: 30 | Fill #0

## 2019-11-24 ENCOUNTER — Ambulatory Visit: Payer: Self-pay | Attending: Family Medicine | Admitting: Physical Therapy

## 2019-11-24 ENCOUNTER — Encounter: Payer: Self-pay | Admitting: Physical Therapy

## 2019-11-24 ENCOUNTER — Other Ambulatory Visit: Payer: Self-pay

## 2019-11-24 DIAGNOSIS — M546 Pain in thoracic spine: Secondary | ICD-10-CM | POA: Insufficient documentation
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
## 2019-12-06 ENCOUNTER — Other Ambulatory Visit: Payer: Self-pay

## 2019-12-06 ENCOUNTER — Ambulatory Visit: Payer: Self-pay | Attending: Family Medicine

## 2019-12-06 DIAGNOSIS — E348 Other specified endocrine disorders: Secondary | ICD-10-CM

## 2019-12-06 DIAGNOSIS — E349 Endocrine disorder, unspecified: Secondary | ICD-10-CM

## 2019-12-06 NOTE — Progress Notes (Signed)
Pt is here for testosterone administration. T 98.61F . No acute illnesses noted.Rosezena Sensor no distress.   Testosterone Cyponate 100mg /ml administered as per [facility] standing order every 28 days. Discussed with patient  SE or AR. Verbalized understanding Injection was administered via IM in Right Gluteus medius muscle. Well tolerated. No reaction noted at the injection site  Scheduled next injection on 05/04//2021. Verbalized understanding [place] [phone] Lot [id number] 03/2022

## 2019-12-07 ENCOUNTER — Ambulatory Visit: Payer: Self-pay | Attending: Family Medicine | Admitting: Physical Therapy

## 2019-12-07 DIAGNOSIS — M546 Pain in thoracic spine: Secondary | ICD-10-CM | POA: Insufficient documentation
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
## 2019-12-07 NOTE — Patient Instructions (Signed)
Access Code: [email or web address] URL: [email or web address] Date: 12/07/2019 Prepared by: Hessie Diener  Exercises Standing Row with Anchored Resistance - 1 x daily - 7 x weekly - 10 reps - 3 sets Shoulder extension with resistance - Neutral - 1 x daily - 7 x weekly - 10 reps - 3 sets

## 2019-12-08 MED FILL — TESTOSTERONE CYP 200 MG/ML: 200 | 28 days supply | Qty: 1 | Fill #5

## 2019-12-09 ENCOUNTER — Ambulatory Visit: Payer: Self-pay | Admitting: Physical Therapy

## 2019-12-13 ENCOUNTER — Ambulatory Visit: Payer: Self-pay | Admitting: Physical Therapy

## 2019-12-15 ENCOUNTER — Other Ambulatory Visit: Payer: Self-pay

## 2019-12-15 ENCOUNTER — Encounter: Payer: Self-pay | Admitting: Physical Therapy

## 2019-12-15 ENCOUNTER — Ambulatory Visit: Payer: Self-pay | Admitting: Physical Therapy

## 2019-12-15 DIAGNOSIS — M546 Pain in thoracic spine: Secondary | ICD-10-CM
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
## 2019-12-19 ENCOUNTER — Ambulatory Visit: Payer: Self-pay | Admitting: Physical Therapy

## 2019-12-19 ENCOUNTER — Encounter: Payer: Self-pay | Admitting: Physical Therapy

## 2019-12-19 ENCOUNTER — Other Ambulatory Visit: Payer: Self-pay

## 2019-12-19 DIAGNOSIS — M546 Pain in thoracic spine: Secondary | ICD-10-CM

## 2019-12-19 MED FILL — TRUE METRIX TEST STRIP: 33 days supply | Qty: 100 | Fill #1

## 2019-12-19 MED FILL — LISINOPRIL 5 MG TABLET: 5 | 30 days supply | Qty: 30 | Fill #1

## 2019-12-19 MED FILL — OMEPRAZOLE DR 40 MG CAPSULE: 40 | 30 days supply | Qty: 30 | Fill #1

## 2019-12-19 MED FILL — TRUEplus LANCETS 28G MISC: 33 days supply | Qty: 100 | Fill #1

## 2019-12-19 MED FILL — METFORMIN HCL 500 MG TABS: 500 | 30 days supply | Qty: 30 | Fill #2

## 2019-12-19 MED FILL — ROSUVASTATIN CALCIUM 10 MG: 10 | 30 days supply | Qty: 30 | Fill #1

## 2019-12-19 MED FILL — GLIMEPIRIDE 4 MG TABS: 4 | 30 days supply | Qty: 30 | Fill #2
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
## 2019-12-21 ENCOUNTER — Ambulatory Visit: Payer: Self-pay | Admitting: Physical Therapy

## 2019-12-21 ENCOUNTER — Other Ambulatory Visit: Payer: Self-pay

## 2019-12-21 ENCOUNTER — Encounter: Payer: Self-pay | Admitting: Physical Therapy

## 2019-12-21 DIAGNOSIS — M546 Pain in thoracic spine: Secondary | ICD-10-CM
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
## 2019-12-26 ENCOUNTER — Encounter: Payer: Self-pay | Admitting: Physical Therapy

## 2019-12-26 ENCOUNTER — Ambulatory Visit: Payer: Self-pay | Admitting: Physical Therapy

## 2019-12-26 ENCOUNTER — Other Ambulatory Visit: Payer: Self-pay

## 2019-12-26 DIAGNOSIS — M546 Pain in thoracic spine: Secondary | ICD-10-CM
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
## 2019-12-28 ENCOUNTER — Other Ambulatory Visit: Payer: Self-pay

## 2019-12-28 ENCOUNTER — Encounter: Payer: Self-pay | Admitting: Physical Therapy

## 2019-12-28 ENCOUNTER — Ambulatory Visit: Payer: Self-pay | Admitting: Physical Therapy

## 2019-12-28 DIAGNOSIS — M546 Pain in thoracic spine: Secondary | ICD-10-CM
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
## 2019-12-29 ENCOUNTER — Other Ambulatory Visit: Payer: Self-pay

## 2019-12-29 DIAGNOSIS — M792 Neuralgia and neuritis, unspecified: Secondary | ICD-10-CM

## 2019-12-29 DIAGNOSIS — M25552 Pain in left hip: Secondary | ICD-10-CM

## 2019-12-29 NOTE — Telephone Encounter (Signed)
Patient was enrolled in [facility] for Lyrica medication, he is currently in the process of re enrolling,pharmacy asked for a refill to be sent to hold him until he is approved.

## 2019-12-30 ENCOUNTER — Other Ambulatory Visit: Payer: Self-pay | Admitting: Family Medicine

## 2019-12-30 DIAGNOSIS — M792 Neuralgia and neuritis, unspecified: Secondary | ICD-10-CM

## 2019-12-30 DIAGNOSIS — M25552 Pain in left hip: Secondary | ICD-10-CM

## 2019-12-30 MED ORDER — PREGABALIN 100 MG PO CAPS: ORAL_CAPSULE | ORAL | 0 refills | Status: DC

## 2019-12-30 MED FILL — PREGABALIN 100 MG CAPS: 100 | 30 days supply | Qty: 60 | Fill #0

## 2020-01-02 ENCOUNTER — Other Ambulatory Visit: Payer: Self-pay

## 2020-01-02 ENCOUNTER — Encounter: Payer: Self-pay | Admitting: Physical Therapy

## 2020-01-02 ENCOUNTER — Ambulatory Visit: Payer: Self-pay | Attending: Family Medicine | Admitting: Physical Therapy

## 2020-01-02 DIAGNOSIS — M546 Pain in thoracic spine: Secondary | ICD-10-CM | POA: Insufficient documentation
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
## 2020-01-03 ENCOUNTER — Ambulatory Visit: Payer: Self-pay

## 2020-01-04 ENCOUNTER — Ambulatory Visit: Payer: Self-pay | Admitting: Physical Therapy

## 2020-01-04 ENCOUNTER — Encounter: Payer: Self-pay | Admitting: Physical Therapy

## 2020-01-04 ENCOUNTER — Other Ambulatory Visit: Payer: Self-pay

## 2020-01-04 DIAGNOSIS — M546 Pain in thoracic spine: Secondary | ICD-10-CM
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
## 2020-01-16 ENCOUNTER — Other Ambulatory Visit: Payer: Self-pay | Admitting: Family Medicine

## 2020-01-16 DIAGNOSIS — M25552 Pain in left hip: Secondary | ICD-10-CM

## 2020-01-16 DIAGNOSIS — M792 Neuralgia and neuritis, unspecified: Secondary | ICD-10-CM

## 2020-01-16 MED FILL — ?ROSUVASTATIN CALCIUM 10 MG: 10 | 30 days supply | Qty: 30 | Fill #2

## 2020-01-16 MED FILL — LISINOPRIL 5 MG TABLET: 5 | 30 days supply | Qty: 30 | Fill #2

## 2020-01-16 MED FILL — GLIMEPIRIDE 4 MG TABS: 4 | 30 days supply | Qty: 30 | Fill #3

## 2020-01-16 MED FILL — METFORMIN HCL 500 MG TABS: 500 | 30 days supply | Qty: 30 | Fill #3

## 2020-01-16 MED FILL — OMEPRAZOLE DR 40 MG CAPSULE: 40 | 30 days supply | Qty: 30 | Fill #2

## 2020-01-19 ENCOUNTER — Other Ambulatory Visit: Payer: Self-pay

## 2020-01-19 ENCOUNTER — Ambulatory Visit: Payer: Self-pay | Admitting: Physical Therapy

## 2020-01-19 ENCOUNTER — Encounter: Payer: Self-pay | Admitting: Physical Therapy

## 2020-01-19 DIAGNOSIS — M546 Pain in thoracic spine: Secondary | ICD-10-CM
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
## 2020-01-25 ENCOUNTER — Other Ambulatory Visit: Payer: Self-pay

## 2020-01-25 ENCOUNTER — Ambulatory Visit: Payer: Self-pay | Admitting: Physical Therapy

## 2020-01-25 ENCOUNTER — Encounter: Payer: Self-pay | Admitting: Physical Therapy

## 2020-01-25 DIAGNOSIS — M546 Pain in thoracic spine: Secondary | ICD-10-CM
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
## 2020-01-27 ENCOUNTER — Other Ambulatory Visit: Payer: Self-pay

## 2020-01-27 ENCOUNTER — Encounter: Payer: Self-pay | Admitting: Physical Therapy

## 2020-01-27 ENCOUNTER — Ambulatory Visit: Payer: Self-pay | Admitting: Physical Therapy

## 2020-01-27 DIAGNOSIS — M546 Pain in thoracic spine: Secondary | ICD-10-CM
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
## 2020-02-01 ENCOUNTER — Other Ambulatory Visit: Payer: Self-pay

## 2020-02-01 ENCOUNTER — Encounter: Payer: Self-pay | Admitting: Physical Therapy

## 2020-02-01 ENCOUNTER — Ambulatory Visit: Payer: Self-pay | Attending: Family Medicine | Admitting: Physical Therapy

## 2020-02-01 DIAGNOSIS — M546 Pain in thoracic spine: Secondary | ICD-10-CM | POA: Insufficient documentation
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
## 2020-02-03 ENCOUNTER — Ambulatory Visit: Payer: Self-pay | Admitting: Physical Therapy

## 2020-02-03 ENCOUNTER — Other Ambulatory Visit: Payer: Self-pay

## 2020-02-03 ENCOUNTER — Encounter: Payer: Self-pay | Admitting: Physical Therapy

## 2020-02-03 DIAGNOSIS — M546 Pain in thoracic spine: Secondary | ICD-10-CM
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
## 2020-02-06 ENCOUNTER — Ambulatory Visit: Payer: Self-pay

## 2020-02-15 MED FILL — ?ROSUVASTATIN CALCIUM 10 MG: 10 | 30 days supply | Qty: 30 | Fill #3

## 2020-02-15 MED FILL — METFORMIN HCL 500 MG TABS: 500 | 30 days supply | Qty: 30 | Fill #4

## 2020-02-15 MED FILL — OMEPRAZOLE DR 40 MG CAPSULE: 40 | 30 days supply | Qty: 30 | Fill #3

## 2020-02-15 MED FILL — GLIMEPIRIDE 4 MG TABS: 4 | 30 days supply | Qty: 30 | Fill #4

## 2020-02-15 MED FILL — LISINOPRIL 5 MG TABLET: 5 | 30 days supply | Qty: 30 | Fill #3

## 2020-02-16 MED FILL — MELOXICAM 15 MG TABLET: 15 | 30 days supply | Qty: 30 | Fill #0

## 2020-03-12 MED FILL — LISINOPRIL 5 MG TABLET: 5 | 30 days supply | Qty: 30 | Fill #4

## 2020-03-12 MED FILL — TRUE METRIX TEST STRIP: 33 days supply | Qty: 100 | Fill #2

## 2020-03-12 MED FILL — GLIMEPIRIDE 4 MG TABS: 4 | 30 days supply | Qty: 30 | Fill #5

## 2020-03-12 MED FILL — METFORMIN HCL 500 MG TABS: 500 | 30 days supply | Qty: 30 | Fill #5

## 2020-03-12 MED FILL — MELOXICAM 15 MG TABLET: 15 | 30 days supply | Qty: 30 | Fill #1

## 2020-03-12 MED FILL — OMEPRAZOLE DR 40 MG CAPSULE: 40 | 30 days supply | Qty: 30 | Fill #4

## 2020-03-12 MED FILL — ?ROSUVASTATIN CALCIUM 10 MG: 10 | 30 days supply | Qty: 30 | Fill #4

## 2020-04-05 ENCOUNTER — Telehealth: Payer: Self-pay

## 2020-04-05 DIAGNOSIS — M25552 Pain in left hip: Secondary | ICD-10-CM

## 2020-04-05 DIAGNOSIS — M792 Neuralgia and neuritis, unspecified: Secondary | ICD-10-CM

## 2020-04-05 MED ORDER — PREGABALIN 100 MG PO CAPS: ORAL_CAPSULE | ORAL | 0 refills | Status: DC

## 2020-04-05 NOTE — Telephone Encounter (Signed)
Done

## 2020-04-05 NOTE — Addendum Note (Signed)
Addended by: Charlott Rakes on: 04/05/2020 01:25 PM   Modules accepted: Orders

## 2020-04-05 NOTE — Telephone Encounter (Signed)
Per [facility], they do not have a refill on file for pt.  If appropriate, can you send a script for a 3 month supply of Lyrica to [facility] in [place], [place]?

## 2020-04-09 MED FILL — OMEPRAZOLE DR 40 MG CAPSULE: 40 | 30 days supply | Qty: 30 | Fill #5

## 2020-04-09 MED FILL — GLIMEPIRIDE 4 MG TABS: 4 | 30 days supply | Qty: 30 | Fill #6

## 2020-04-09 MED FILL — LISINOPRIL 5 MG TABLET: 5 | 30 days supply | Qty: 30 | Fill #5

## 2020-04-09 MED FILL — ?ROSUVASTATIN CALCIUM 10 MG: 10 | 30 days supply | Qty: 30 | Fill #5

## 2020-04-18 ENCOUNTER — Other Ambulatory Visit: Payer: Self-pay | Admitting: Family Medicine

## 2020-04-18 DIAGNOSIS — M792 Neuralgia and neuritis, unspecified: Secondary | ICD-10-CM

## 2020-04-18 DIAGNOSIS — M25552 Pain in left hip: Secondary | ICD-10-CM

## 2020-04-18 NOTE — Telephone Encounter (Signed)
Just prescribed on 04/05/20. Refusing request at this time.

## 2020-04-23 ENCOUNTER — Ambulatory Visit: Payer: Self-pay

## 2020-04-23 ENCOUNTER — Other Ambulatory Visit: Payer: Self-pay

## 2020-04-30 MED FILL — ?METFORMIN HCL 500MG TABL: 500 | 30 days supply | Qty: 30 | Fill #6

## 2020-05-09 ENCOUNTER — Encounter: Payer: Self-pay | Admitting: Family Medicine

## 2020-05-09 ENCOUNTER — Other Ambulatory Visit: Payer: Self-pay | Admitting: Family Medicine

## 2020-05-09 ENCOUNTER — Ambulatory Visit: Payer: Self-pay | Attending: Family Medicine | Admitting: Family Medicine

## 2020-05-09 ENCOUNTER — Other Ambulatory Visit: Payer: Self-pay

## 2020-05-09 VITALS — BP 117/62 | HR 64 | Ht 65.0 in | Wt 181.2 lb

## 2020-05-09 DIAGNOSIS — E119 Type 2 diabetes mellitus without complications: Secondary | ICD-10-CM

## 2020-05-09 DIAGNOSIS — I10 Essential (primary) hypertension: Secondary | ICD-10-CM

## 2020-05-09 DIAGNOSIS — Z23 Encounter for immunization: Secondary | ICD-10-CM

## 2020-05-09 DIAGNOSIS — E785 Hyperlipidemia, unspecified: Secondary | ICD-10-CM

## 2020-05-09 DIAGNOSIS — M792 Neuralgia and neuritis, unspecified: Secondary | ICD-10-CM

## 2020-05-09 DIAGNOSIS — E349 Endocrine disorder, unspecified: Secondary | ICD-10-CM

## 2020-05-09 LAB — POCT GLYCOSYLATED HEMOGLOBIN (HGB A1C): HbA1c, POC (controlled diabetic range): 6.7 % (ref 0.0–7.0)

## 2020-05-09 LAB — GLUCOSE, POCT (MANUAL RESULT ENTRY): POC Glucose: 148 mg/dl — AB (ref 70–99)

## 2020-05-09 MED ORDER — TESTOSTERONE CYPIONATE 200 MG/ML IM SOLN
100.0000 mg | Freq: Once | INTRAMUSCULAR | Status: AC
  Administered 2020-05-09: 100 mg via INTRAMUSCULAR

## 2020-05-09 MED ORDER — PREGABALIN 100 MG PO CAPS: ORAL_CAPSULE | ORAL | 0 refills | Status: DC

## 2020-05-09 MED ORDER — OMEPRAZOLE 40 MG PO CPDR: 40.0000 mg | DELAYED_RELEASE_CAPSULE | Freq: Every day | ORAL | 6 refills | Status: AC

## 2020-05-09 MED ORDER — GLIMEPIRIDE 4 MG PO TABS: 4.0000 mg | ORAL_TABLET | Freq: Every day | ORAL | 6 refills | Status: AC

## 2020-05-09 MED ORDER — TESTOSTERONE CYPIONATE 200 MG/ML IM SOLN: 100.0000 mg | INTRAMUSCULAR | 5 refills | Status: DC

## 2020-05-09 MED ORDER — LISINOPRIL 5 MG PO TABS: 5.0000 mg | ORAL_TABLET | Freq: Every day | ORAL | 6 refills | Status: AC

## 2020-05-09 MED ORDER — ROSUVASTATIN CALCIUM 10 MG PO TABS: 10.0000 mg | ORAL_TABLET | Freq: Every day | ORAL | 6 refills | Status: AC

## 2020-05-09 MED FILL — GLIMEPIRIDE 4 MG TABS: 4 | 30 days supply | Qty: 30 | Fill #0

## 2020-05-09 MED FILL — LISINOPRIL 5 MG TABLET: 5 | 30 days supply | Qty: 30 | Fill #0

## 2020-05-09 MED FILL — OMEPRAZOLE DR 40 MG CAPSULE: 40 | 30 days supply | Qty: 30 | Fill #0

## 2020-05-09 MED FILL — ?ROSUVASTATIN CALCIUM 10 MG: 10 | 30 days supply | Qty: 30 | Fill #0

## 2020-05-09 NOTE — Patient Instructions (Signed)
La diabetes mellitus y el cuidado de los pies  Diabetes Mellitus and Foot Care  El cuidado de los pies es un aspecto importante de la salud, especialmente si tiene diabetes. La diabetes puede generar problemas debido a que el flujo sanguíneo (circulación) es deficiente en las piernas y los pies, y esto puede hacer que la piel:  · Se torne más fina y seca.  · Se resquebraje más fácilmente.  · Cicatrice más lentamente.  · Se descame y agriete.  También pueden estar dañados los nervios (neuropatía) de las piernas y de los pies, lo que provoca una disminución de la sensibilidad. En consecuencia, es posible que no advierta heridas pequeñas en los pies que pueden causar problemas más graves. Identificar y tratar cualquier complicación lo antes posible es la mejor manera de evitar futuros problemas de pie.  Cómo cuidar los pies  Higiene de los pies  · Lávese los pies todos los días con agua tibia y un jabón suave. No use agua caliente. Luego séquese los pies y entre los dedos dando palmaditas, hasta que estén completamente secos. No remoje los pies, ya que esto puede resecar la piel.  · Córtese las uñas de los pies en línea recta. No escarbe debajo de las uñas o alrededor de las cutículas. Lime los bordes de las uñas con una lima o esmeril.  · Aplique una loción hidratante o vaselina en la piel de los pies y en las uñas secas y quebradizas. Use una loción que no contenga alcohol ni fragancias. No aplique loción entre los dedos.  Zapatos y calcetines  · Use calcetines de algodón o medias limpias todos los días. Asegúrese de que no le ajusten demasiado. No use calcetines que le lleguen a las rodillas, ya que podrían disminuir el flujo de sangre a las piernas.  · Use zapatos de cuero que le queden bien y que sean acolchados. Revise siempre los zapatos antes de ponerlos para asegurarse de que no haya objetos en su interior.  · Para amoldar los zapatos, cálcelos solo algunas horas por día. Esto evitará lesiones en los  pies.  Heridas, rasguños, durezas y callosidades  · Controle sus pies diariamente para observar si hay ampollas, cortes, moretones, llagas o enrojecimiento. Si no puede ver la planta del pie, use un espejo o pídale ayuda a otra persona.  · No corte las durezas o callosidades, ni trate de quitarlas con medicamentos.  · Si algo le ha raspado, cortado o lastimado la piel de los pies, mantenga la piel de esa zona limpia y seca. Puede higienizar estas zonas con agua y un jabón suave. No limpie la zona con agua oxigenada, alcohol ni yodo.  · Si tiene una herida, un rasguño, una dureza o una callosidad en el pie, revísela varias veces al día para asegurarse de que se esté curando y no se infecte. Esté atento a los siguientes signos:  ? Dolor, hinchazón o enrojecimiento.  ? Líquido o sangre.  ? Calor.  ? Pus o mal olor.  Instrucciones generales  · No se cruce de piernas. Esto puede disminuir el flujo de sangre a los pies.  · No use bolsas de agua caliente ni almohadillas térmicas en los pies. Podrían causar quemaduras. Si ha perdido la sensibilidad en los pies o las piernas, no sabrá lo que le está sucediendo hasta que sea demasiado tarde.  · Proteja sus pies del calor y del frío con calzado, en la playa o sobre el pavimento caliente.  · Programe una cita   pies, infrmele al mdico de inmediato sobre los cortes, las llagas o los moretones. Comunquese con un mdico si:  Tiene una afeccin que aumenta su riesgo de tener infecciones y tiene cortes, llagas o moretones en los pies.  Tiene una lesin que no se cura.  Tiene una zona irritada en las piernas o los pies.  Siente una sensacin de ardor u hormigueo en las piernas o los pies.  Siente dolor o calambres en las piernas o los pies.  Las piernas o los pies estn adormecidos.  Siente los pies siempre  fros.  Siente dolor alrededor de una ua del pie. Solicite ayuda de inmediato si:  Tiene una herida, un rasguo, una dureza o una callosidad en el pie y: ? Tiene dolor, hinchazn o enrojecimiento que empeora. ? Le sale lquido o sangre de la herida, el rasguo, la dureza o la callosidad. ? La herida, el rasguo, la dureza o la callosidad est caliente al tacto. ? Le sale pus o mal olor de la herida, el rasguo, la dureza o la callosidad. ? Tiene fiebre. ? Tiene una lnea roja que sube por la pierna. Resumen  Controle todos los das el estado de sus pies para observar si hay cortes, llagas, manchas rojas, hinchazn o ampollas.  Humctese los pies y las piernas a diario.  Use zapatos de cuero que le queden bien y que sean acolchados.  Si tiene problemas en los pies, infrmele al mdico de inmediato sobre los cortes, las llagas o los moretones.  Programe una cita para un examen completo de los pies por lo menos una vez al ao (anualmente) o con ms frecuencia si tiene problemas en los pies. Esta informacin no tiene como fin reemplazar el consejo del mdico. Asegrese de hacerle al mdico cualquier pregunta que tenga. Document Revised: 04/10/2017 Document Reviewed: 04/10/2017 Elsevier Patient Education  2020 Elsevier Inc.  
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
## 2020-05-10 ENCOUNTER — Encounter: Payer: Self-pay | Admitting: Family Medicine

## 2020-05-10 LAB — MICROALBUMIN / CREATININE URINE RATIO
Creatinine, Urine: 156.6 mg/dL
Microalb/Creat Ratio: 10 mg/g creat (ref 0–29)
Microalbumin, Urine: 16.4 ug/mL

## 2020-05-10 LAB — CMP14+EGFR
ALT: 28 IU/L (ref 0–44)
AST: 23 IU/L (ref 0–40)
Albumin/Globulin Ratio: 2 (ref 1.2–2.2)
Albumin: 4.8 g/dL (ref 3.8–4.8)
Alkaline Phosphatase: 76 IU/L (ref 48–121)
BUN/Creatinine Ratio: 32 — ABNORMAL HIGH (ref 10–24)
BUN: 22 mg/dL (ref 8–27)
Bilirubin Total: 0.3 mg/dL (ref 0.0–1.2)
CO2: 24 mmol/L (ref 20–29)
Calcium: 10.3 mg/dL — ABNORMAL HIGH (ref 8.6–10.2)
Chloride: 103 mmol/L (ref 96–106)
Creatinine, Ser: 0.69 mg/dL — ABNORMAL LOW (ref 0.76–1.27)
GFR calc Af Amer: 113 mL/min/{1.73_m2} (ref >59)
GFR calc non Af Amer: 98 mL/min/{1.73_m2} (ref >59)
Globulin, Total: 2.4 g/dL (ref 1.5–4.5)
Glucose: 109 mg/dL — ABNORMAL HIGH (ref 65–99)
Potassium: 4.3 mmol/L (ref 3.5–5.2)
Sodium: 142 mmol/L (ref 134–144)
Total Protein: 7.2 g/dL (ref 6.0–8.5)

## 2020-05-10 LAB — LIPID PANEL
Chol/HDL Ratio: 4.1 ratio (ref 0.0–5.0)
Cholesterol, Total: 190 mg/dL (ref 100–199)
HDL: 46 mg/dL (ref >39)
LDL Chol Calc (NIH): 97 mg/dL (ref 0–99)
Triglycerides: 280 mg/dL — ABNORMAL HIGH (ref 0–149)
VLDL Cholesterol Cal: 47 mg/dL — ABNORMAL HIGH (ref 5–40)

## 2020-05-30 ENCOUNTER — Other Ambulatory Visit: Payer: Self-pay | Admitting: Family Medicine

## 2020-05-30 MED FILL — METFORMIN HCL 500 MG TABS: 500 | 30 days supply | Qty: 30 | Fill #0

## 2020-05-30 NOTE — Telephone Encounter (Signed)
Requested Prescriptions  Pending Prescriptions Disp Refills  . metFORMIN (GLUCOPHAGE) 500 MG tablet [Pharmacy Med Name: METFORMIN HCL 500MG TABL 500 Tablet] 90 tablet 1    Sig: TAKE 1 TABLET (500 MG TOTAL) BY MOUTH DAILY WITH BREAKFAST.     Endocrinology:  Diabetes - Biguanides Failed - 05/30/2020  9:06 AM      Failed - Cr in normal range and within 360 days    Creat  Date Value Ref Range Status  09/02/2016 0.63 (L) 0.70 - 1.25 mg/dL Final    Comment:      For patients > or = 68 years of age: The upper reference limit for Creatinine is approximately 13% higher for people identified as African-American.      Creatinine, Ser  Date Value Ref Range Status  05/09/2020 0.69 (L) 0.76 - 1.27 mg/dL Final   Creatinine, Urine  Date Value Ref Range Status  01/25/2016 140 20 - 370 mg/dL Final         Passed - HBA1C is between 0 and 7.9 and within 180 days    HbA1c, POC (controlled diabetic range)  Date Value Ref Range Status  05/09/2020 6.7 0.0 - 7.0 % Final         Passed - eGFR in normal range and within 360 days    GFR, Est African American  Date Value Ref Range Status  09/02/2016 >89 >=60 mL/min Final   GFR calc Af Amer  Date Value Ref Range Status  05/09/2020 113 >59 mL/min/1.73 Final    Comment:    **[facility] currently reports eGFR in compliance with the current**   recommendations of the [employer]. [facility] will   update reporting as new guidelines are published from the [employer]-ASN   Task force.    GFR, Est Non African American  Date Value Ref Range Status  09/02/2016 >89 >=60 mL/min Final   GFR calc non Af Amer  Date Value Ref Range Status  05/09/2020 98 >59 mL/min/1.73 Final         Passed - Valid encounter within last 6 months    Recent Outpatient Visits          3 weeks ago Type 2 diabetes mellitus without complication, without long-term current use of insulin ([place])   [place], [place], MD   7 months ago  Type 2 diabetes mellitus without complication, without long-term current use of insulin ([place])   [place], [place], MD   8 months ago Need for vaccination for zoster   [place], Stephen L, RPH-CPP   10 months ago Encounter for herpes zoster vaccination   [place], Jarome Matin, RPH-CPP   10 months ago Other hemorrhoids   [place], MD      Future Appointments            In 5 months Charlott Rakes, MD [place]           See lab note by Dr Margarita Rana 05/10/20

## 2020-06-01 ENCOUNTER — Other Ambulatory Visit: Payer: Self-pay | Admitting: Family Medicine

## 2020-06-01 DIAGNOSIS — E349 Endocrine disorder, unspecified: Secondary | ICD-10-CM

## 2020-06-01 NOTE — Telephone Encounter (Signed)
Patients wife came into the office and requested for listed medication to be sent to Outpatient pharmacy.  testosterone cypionate (DEPOTESTOSTERONE CYPIONATE) 200 MG/ML injection [[id number]]

## 2020-06-04 ENCOUNTER — Other Ambulatory Visit: Payer: Self-pay | Admitting: Family Medicine

## 2020-06-04 MED ORDER — TESTOSTERONE CYPIONATE 200 MG/ML IM SOLN: 100.0000 mg | INTRAMUSCULAR | 5 refills | Status: DC

## 2020-06-04 NOTE — Telephone Encounter (Signed)
Will route to PCP for review. 

## 2020-06-04 NOTE — Telephone Encounter (Signed)
Done

## 2020-06-05 NOTE — Telephone Encounter (Signed)
LVM that Rx was sent to pharmacy

## 2020-06-07 MED FILL — TESTOSTERONE CYP 200 MG/ML: 200 | 84 days supply | Qty: 3 | Fill #0

## 2020-06-12 MED FILL — LISINOPRIL 5 MG TABLET: 5 | 30 days supply | Qty: 30 | Fill #1

## 2020-06-12 MED FILL — GLIMEPIRIDE 4 MG TABS: 4 | 30 days supply | Qty: 30 | Fill #1

## 2020-06-12 MED FILL — OMEPRAZOLE DR 40 MG CAPSULE: 40 | 30 days supply | Qty: 30 | Fill #1

## 2020-06-12 MED FILL — TRUE METRIX TEST STRIP: 33 days supply | Qty: 100 | Fill #3

## 2020-06-12 MED FILL — TRUEplus LANCETS 28G MISC: 33 days supply | Qty: 100 | Fill #2

## 2020-06-12 MED FILL — ?ROSUVASTATIN CALCIUM 10 MG: 10 | 30 days supply | Qty: 30 | Fill #1

## 2020-06-29 MED FILL — METFORMIN HCL 500 MG TABS: 500 | 30 days supply | Qty: 30 | Fill #1

## 2020-07-16 ENCOUNTER — Other Ambulatory Visit: Payer: Self-pay | Admitting: Family Medicine

## 2020-07-16 DIAGNOSIS — M792 Neuralgia and neuritis, unspecified: Secondary | ICD-10-CM

## 2020-07-16 MED FILL — GLIMEPIRIDE 4 MG TABS: 4 | 30 days supply | Qty: 30 | Fill #2

## 2020-07-16 MED FILL — ?ROSUVASTATIN CALCIUM 10 MG: 10 | 30 days supply | Qty: 30 | Fill #2

## 2020-07-16 MED FILL — OMEPRAZOLE DR 40 MG CAPSULE: 40 | 30 days supply | Qty: 30 | Fill #2

## 2020-07-16 MED FILL — LISINOPRIL 5 MG TABLET: 5 | 30 days supply | Qty: 30 | Fill #2

## 2020-07-16 NOTE — Telephone Encounter (Signed)
Requested medications are due for refill today yes  Requested medications are on the active medication list yes  Last refill 8/16  Last visit 05/2020  Future visit scheduled 10/2020  Notes to clinic Not Delegated

## 2020-07-24 MED FILL — METFORMIN HCL 500 MG TABS: 500 | 30 days supply | Qty: 30 | Fill #2

## 2020-08-02 ENCOUNTER — Other Ambulatory Visit: Payer: Self-pay | Admitting: Family Medicine

## 2020-08-02 DIAGNOSIS — M792 Neuralgia and neuritis, unspecified: Secondary | ICD-10-CM

## 2020-08-02 NOTE — Telephone Encounter (Signed)
Requested medication (s) are due for refill today: yes  Requested medication (s) are on the active medication list: yes  Last refill:  07/19/20 #180 0 refills  Future visit scheduled: yes in 3 months  Notes to clinic:  not delegated per protocol     Requested Prescriptions  Pending Prescriptions Disp Refills   LYRICA 100 MG capsule [Pharmacy Med Name: LYRICA 100 MG CAPSULE] 180 capsule 0    Sig: Take 1 capsule by mouth twice a day as directed by physician.      Not Delegated - Neurology:  Anticonvulsants - Controlled Failed - 08/02/2020  5:33 PM      Failed - This refill cannot be delegated      Passed - Valid encounter within last 12 months    Recent Outpatient Visits           2 months ago Type 2 diabetes mellitus without complication, without long-term current use of insulin ([place])   [place], [place], MD   9 months ago Type 2 diabetes mellitus without complication, without long-term current use of insulin ([place])   [place], [place], MD   10 months ago Need for vaccination for zoster   [place], RPH-CPP   1 year ago Encounter for herpes zoster vaccination   [place], RPH-CPP   1 year ago Other hemorrhoids   [place], MD       Future Appointments             In 3 months Charlott Rakes, MD [place]

## 2020-08-10 MED FILL — GLIMEPIRIDE 4 MG TABS: 4 | 30 days supply | Qty: 30 | Fill #3

## 2020-08-10 MED FILL — OMEPRAZOLE DR 40 MG CAPSULE: 40 | 30 days supply | Qty: 30 | Fill #3

## 2020-08-10 MED FILL — LISINOPRIL 5 MG TABLET: 5 | 30 days supply | Qty: 30 | Fill #3

## 2020-08-10 MED FILL — ?ROSUVASTATIN CALCIUM 10 MG: 10 | 30 days supply | Qty: 30 | Fill #3

## 2020-08-16 MED FILL — TRUE METRIX GLUCOSE TEST ST: 33 days supply | Qty: 100 | Fill #4

## 2020-08-16 MED FILL — TRUEplus LANCETS 28G MISC: 33 days supply | Qty: 100 | Fill #3

## 2020-08-21 MED FILL — METFORMIN HCL 500 MG TABS: 500 | 30 days supply | Qty: 30 | Fill #3

## 2020-09-04 MED FILL — TESTOSTERONE CYP 200 MG/ML: 200 | 84 days supply | Qty: 3 | Fill #1

## 2020-09-06 MED FILL — OMEPRAZOLE DR 40 MG CAPSULE: 40 | 30 days supply | Qty: 30 | Fill #4

## 2020-09-06 MED FILL — LISINOPRIL 5 MG TABLET: 5 | 30 days supply | Qty: 30 | Fill #4

## 2020-09-06 MED FILL — GLIMEPIRIDE 4 MG TABS: 4 | 30 days supply | Qty: 30 | Fill #4

## 2020-09-06 MED FILL — ?ROSUVASTATIN CALCIUM 10 MG: 10 | 30 days supply | Qty: 30 | Fill #4

## 2020-09-18 MED FILL — METFORMIN HCL 500 MG TABS: 500 | 30 days supply | Qty: 30 | Fill #4

## 2020-10-04 MED FILL — ?ROSUVASTATIN CALCIUM 10 MG: 10 | 30 days supply | Qty: 30 | Fill #5

## 2020-10-04 MED FILL — OMEPRAZOLE DR 40 MG CAPSULE: 40 | 30 days supply | Qty: 30 | Fill #5

## 2020-10-04 MED FILL — LISINOPRIL 5 MG TABLET: 5 | 30 days supply | Qty: 30 | Fill #5

## 2020-10-04 MED FILL — GLIMEPIRIDE 4 MG TABS: 4 | 30 days supply | Qty: 30 | Fill #5

## 2020-10-15 MED FILL — METFORMIN HCL 500 MG TABS: 500 | 30 days supply | Qty: 30 | Fill #5

## 2020-10-31 MED FILL — LISINOPRIL 5 MG TABLET: 5 | 30 days supply | Qty: 30 | Fill #6

## 2020-10-31 MED FILL — OMEPRAZOLE DR 40 MG CAPSULE: 40 | 30 days supply | Qty: 30 | Fill #6

## 2020-10-31 MED FILL — GLIMEPIRIDE 4 MG TABS: 4 | 30 days supply | Qty: 30 | Fill #6

## 2020-10-31 MED FILL — ROSUVASTATIN CALCIUM 10 MG: 10 | 30 days supply | Qty: 30 | Fill #6

## 2020-11-06 ENCOUNTER — Ambulatory Visit: Payer: Self-pay | Admitting: Family Medicine

## 2020-11-29 MED FILL — TESTOSTERONE CYP 200 MG/ML: 200 | 84 days supply | Qty: 3 | Fill #2
